# Patient Record
Sex: Female | Born: 1945 | Race: Black or African American | Hispanic: No | Marital: Married | Smoking: Former smoker
Health system: Southern US, Community
[De-identification: ages and names within clinical notes are randomized; demographics above are authoritative.]

## PROBLEM LIST (undated history)

## (undated) DIAGNOSIS — Z800 Family history of malignant neoplasm of digestive organs: Secondary | ICD-10-CM

## (undated) DIAGNOSIS — Z803 Family history of malignant neoplasm of breast: Secondary | ICD-10-CM

## (undated) DIAGNOSIS — Z9981 Dependence on supplemental oxygen: Secondary | ICD-10-CM

## (undated) DIAGNOSIS — C801 Malignant (primary) neoplasm, unspecified: Secondary | ICD-10-CM

## (undated) DIAGNOSIS — Z853 Personal history of malignant neoplasm of breast: Secondary | ICD-10-CM

## (undated) DIAGNOSIS — Z8042 Family history of malignant neoplasm of prostate: Secondary | ICD-10-CM

## (undated) DIAGNOSIS — R519 Headache, unspecified: Secondary | ICD-10-CM

## (undated) DIAGNOSIS — K219 Gastro-esophageal reflux disease without esophagitis: Secondary | ICD-10-CM

## (undated) DIAGNOSIS — R0600 Dyspnea, unspecified: Secondary | ICD-10-CM

## (undated) DIAGNOSIS — J449 Chronic obstructive pulmonary disease, unspecified: Secondary | ICD-10-CM

## (undated) DIAGNOSIS — R011 Cardiac murmur, unspecified: Secondary | ICD-10-CM

## (undated) DIAGNOSIS — R51 Headache: Secondary | ICD-10-CM

## (undated) DIAGNOSIS — I10 Essential (primary) hypertension: Secondary | ICD-10-CM

## (undated) HISTORY — DX: Family history of malignant neoplasm of breast: Z80.3

## (undated) HISTORY — DX: Personal history of malignant neoplasm of breast: Z85.3

## (undated) HISTORY — DX: Family history of malignant neoplasm of prostate: Z80.42

## (undated) HISTORY — DX: Family history of malignant neoplasm of digestive organs: Z80.0

## (undated) HISTORY — PX: TONSILECTOMY, ADENOIDECTOMY, BILATERAL MYRINGOTOMY AND TUBES: SHX2538

## (undated) HISTORY — PX: APPENDECTOMY: SHX54

## (undated) HISTORY — DX: Essential (primary) hypertension: I10

## (undated) HISTORY — PX: OTHER SURGICAL HISTORY: SHX169

## (undated) HISTORY — DX: Chronic obstructive pulmonary disease, unspecified: J44.9

## (undated) HISTORY — PX: CATARACT EXTRACTION, BILATERAL: SHX1313

---

## 2017-07-31 ENCOUNTER — Other Ambulatory Visit: Payer: Self-pay | Admitting: Radiology

## 2017-08-03 ENCOUNTER — Encounter: Payer: Self-pay | Admitting: *Deleted

## 2017-08-03 ENCOUNTER — Other Ambulatory Visit: Payer: Self-pay | Admitting: *Deleted

## 2017-08-03 DIAGNOSIS — C50411 Malignant neoplasm of upper-outer quadrant of right female breast: Secondary | ICD-10-CM | POA: Insufficient documentation

## 2017-08-03 DIAGNOSIS — Z170 Estrogen receptor positive status [ER+]: Principal | ICD-10-CM

## 2017-08-06 NOTE — Progress Notes (Signed)
[name]  [phone] [phone]     ID: [name] DOB: 1945-07-02  MR#: [id number]  GYJ#:[id number]  Patient Care Team: Raelene Bott, MD as PCP - General (Internal Medicine) Excell Seltzer, MD as Consulting Physician (General Surgery) Bernice Mullin, Virgie Dad, MD as Consulting Physician (Oncology) Eppie Gibson, MD as Attending Physician (Radiation Oncology) OTHER MD: Pulmonary Dr. Melvern Sample in [place].  Derm-   CHIEF COMPLAINT: Estrogen receptor positive breast cancer  CURRENT TREATMENT: Awaiting definitive surgery   HISTORY OF CURRENT ILLNESS: Ashley Conrad has a prior history of right-sided breast cancer, dating back to 1989.  She was treated by Dr. Gaynelle Arabian and Dr. Arloa Koh, with lumpectomy and full axillary lymph node dissection, chemotherapy (for 6 months, so most likely CMF) and then radiation.  She did not receive antiestrogens.   More recently patient had routine screening mammography on 07/17/2017 showing a possible asymmetry in the right breast. She underwent bilateral diagnostic mammography with tomography and right breast ultrasonography at  [facility] on 07/24/2017 showing: breast density category A. There is a 1.1 cm mass and a 0.8 cm mass upper outer quadrant of the right breast found mammographically. These hypoechoic masses measure 1.5 cm and 0.8 cm sonographically and were located at the 11 o'clock upper outer quadrant posteriorly and 11 cm from the nipple. There was no evidence of lymphadenopathy in the right axilla.   Accordingly on 07/31/2017 she proceeded to biopsy of the 2 right breast masses in question. 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 The 1.1 cm mass was significant for prognostic indicators: estrogen receptor, 90% positive with strong staining intensity and progesterone receptor, 70% positive  with moderate staining intensity. Proliferation marker Ki67 at 10%. HER2 not amplified with ratios HER2/CEP17 signals 1.52 and average HER2 copies per cell 3.50. The 0.8 cm mass was significant for prognostic indicators: estrogen receptor, 100% positive with strong staining intensity and progesterone receptor, 10% positive with moderate staining intentisty. Proliferation marker Ki67 at 10%. HER2 not amplified with ratios HER2/CEP17 signals 1.22 and average HER2 copies per cell 2.75.   The patient's subsequent history is as detailed below.  INTERVAL HISTORY: Ashley Conrad was evaluated in the multidisciplinary breast cancer clinic on 08/08/2017 accompanied by her daughter [name], and the patient's granddaughter [name]. Her case was also presented at the multidisciplinary breast cancer conference on the same day. At that time a preliminary plan was proposed: Breast conserving surgery, Oncotype testing, genetics testing, antiestrogens   REVIEW OF SYSTEMS: There were no specific symptoms leading to the original mammogram, which was routinely scheduled. The patient denies unusual headaches, visual changes, nausea, vomiting, stiff neck, dizziness, or gait imbalance. There has been no cough, phlegm production, or pleurisy, no chest pain or pressure, and no change in bowel or bladder habits. The patient denies fever, rash, bleeding, unexplained fatigue or unexplained weight loss. A detailed review of systems was otherwise entirely negative.   PAST MEDICAL HISTORY: Past Medical History:  Diagnosis Date  . COPD (chronic obstructive pulmonary disease) ([place])   . History of right breast cancer   . Hypertension    She denies any heart issues such as palpitations, murmur, or disease. She denies asthma, emphysema, cataracts, headaches, HTN, diabetes, issues with bladder or bowel movements.   PAST SURGICAL HISTORY: Past Surgical History:  Procedure Laterality Date  . APPENDECTOMY    . right lumpectomy    .  TONSILECTOMY, ADENOIDECTOMY, BILATERAL MYRINGOTOMY AND TUBES      FAMILY HISTORY Family History  Problem Relation Age of Onset  . Breast cancer Mother   . Prostate cancer Father   . Prostate cancer Brother    The patient's father died at age 58 due to CHF. The patient's mother died at age 71 due to aneurysm. The patient's mother was also diagnosed with breast cancer at age 30. The patient's father was diagnosed with prostate cancer at age 27. The patient's brother was diagnosed with prostate cancer at age 28. The patient denies a history of ovarian cancer in the family.   GYNECOLOGIC HISTORY:  No LMP recorded. Menarche: 72 years old Age at first live birth: 72 years old She is GXP2. Her LMP was November 1989 in her mid 74's. She never used contraception or HRT.    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 Her husband, Elenore Rota, is retired from working in Charity fundraiser. The patient's oldest daughter, [name], lives in [place] as an Web designer at [employer]. The patient's son, [name]., also lives in [place] and works in Scientist, research (life sciences). The patient has 2 grandchildren. Her granddaughter [name] is a Equities trader at [employer] social work. The patient attends [employer].         ADVANCED DIRECTIVES:    HEALTH MAINTENANCE: Social History   Tobacco Use  . Smoking status: Former Smoker    Last attempt to quit: 08/08/2012    Years since quitting: 5.0  Substance Use Topics  . Alcohol use: Not Currently  . Drug use: Never     Colonoscopy: Dr. [place] 2011/ normal  PAP:  Bone density: normal 2018   Allergies  Allergen Reactions  . Cyclobenzaprine Other (See Comments)  . Shellfish Allergy Itching    Itching    Current Outpatient Medications  Medication Sig Dispense Refill  . albuterol (PROVENTIL HFA;VENTOLIN HFA) 108 (90 Base) MCG/ACT inhaler Inhale 2 puffs into the lungs 2 (two) times daily as  needed for wheezing or shortness of breath.    Marland Kitchen atorvastatin (LIPITOR) 10 MG tablet Take 10 mg by mouth daily.    . budesonide-formoterol (SYMBICORT) 160-4.5 MCG/ACT inhaler Inhale 2 puffs into the lungs 2 (two) times daily.    . cetirizine (ZYRTEC) 10 MG tablet Take 10 mg by mouth daily.    Marland Kitchen denosumab (PROLIA) 60 MG/ML SOSY injection Inject 60 mg into the skin every 6 (six) months.    . diltiazem (TIAZAC) 240 MG 24 hr capsule Take 240 mg by mouth daily.    . DULoxetine (CYMBALTA) 30 MG capsule Take 30 mg by mouth daily.    . Ergocalciferol (VITAMIN D2 PO) Take 50,000 Units by mouth daily.    . fluticasone (FLONASE) 50 MCG/ACT nasal spray Place 1 spray into both nostrils daily.    . montelukast (SINGULAIR) 10 MG tablet Take 10 mg by mouth at bedtime.    Marland Kitchen omeprazole (PRILOSEC) 20 MG capsule Take 20 mg by mouth at bedtime.    Marland Kitchen tiotropium (SPIRIVA) 18 MCG inhalation capsule Place 18 mcg into inhaler and inhale daily.    . fluconazole (DIFLUCAN) 100 MG tablet Take 2 tabs today, then 1 tab daily for 4 more days. Hold Atorvastatin while on this. (Patient not taking: Reported on 08/08/2017) 6 tablet 0   No current facility-administered medications for this visit.     OBJECTIVE: Older African-American woman wearing oxygen by nasal cannula  Vitals:  08/08/17 0849  BP: 133/79  Pulse: 95  Resp: 18  Temp: 98.6 F (37 C)  SpO2: 91%     Body mass index is 4,405.31 kg/m.   Wt Readings from Last 3 Encounters:  08/08/17 172 lb 11.2 oz (78.3 kg)      ECOG FS:2 - Symptomatic, <50% confined to bed  Ocular: Sclerae unicteric, pupils round and equal Ear-nose-throat: Full upper plate Lymphatic: No cervical or supraclavicular adenopathy Lungs no rales or rhonchi Heart regular rate and rhythm Abd soft, nontender, positive bowel sounds MSK no focal spinal tenderness, no joint edema Neuro: non-focal, well-oriented, appropriate affect Breasts: The right breast is status post remote lumpectomy and  radiation.  I do not palpate a clearly defined mass.  There is minimal distortion of the breast contour.  The left breast is benign.  The right axilla is status post dissection remotely.  The left axilla is benign   LAB RESULTS:  CMP     Component Value Date/Time   NA 140 08/08/2017 0838   K 4.1 08/08/2017 0838   CL 102 08/08/2017 0838   CO2 28 08/08/2017 0838   GLUCOSE 130 08/08/2017 0838   BUN 16 08/08/2017 0838   CREATININE 0.78 08/08/2017 0838   CALCIUM 9.6 08/08/2017 0838   PROT 7.6 08/08/2017 0838   ALBUMIN 3.7 08/08/2017 0838   AST 12 08/08/2017 0838   ALT 11 08/08/2017 0838   ALKPHOS 121 08/08/2017 0838   BILITOT 0.4 08/08/2017 0838   GFRNONAA >60 08/08/2017 0838   GFRAA >60 08/08/2017 0838    No results found for: TOTALPROTELP, ALBUMINELP, A1GS, A2GS, BETS, BETA2SER, GAMS, MSPIKE, SPEI  No results found for: KPAFRELGTCHN, LAMBDASER, KAPLAMBRATIO  Lab Results  Component Value Date   WBC 12.1 (H) 08/08/2017   NEUTROABS 9.8 (H) 08/08/2017   HGB 13.1 08/08/2017   HCT 40.9 08/08/2017   MCV 100.7 08/08/2017   PLT 268 08/08/2017    @LASTCHEMISTRY @  No results found for: LABCA2  No components found for: KMQKMM381  No results for input(s): INR in the last 168 hours.  No results found for: LABCA2  No results found for: RRN165  No results found for: BXU383  No results found for: FXO329  No results found for: CA2729  No components found for: HGQUANT  No results found for: CEA1 / No results found for: CEA1   No results found for: AFPTUMOR  No results found for: CHROMOGRNA  No results found for: PSA1  Appointment on 08/08/2017  Component Date Value Ref Range Status  . Sodium 08/08/2017 140  136 - 145 mmol/L Final  . Potassium 08/08/2017 4.1  3.5 - 5.1 mmol/L Final  . Chloride 08/08/2017 102  98 - 109 mmol/L Final  . CO2 08/08/2017 28  22 - 29 mmol/L Final  . Glucose, Bld 08/08/2017 130  70 - 140 mg/dL Final  . BUN 08/08/2017 16  7 - 26 mg/dL Final   . Creatinine 08/08/2017 0.78  0.60 - 1.10 mg/dL Final  . Calcium 08/08/2017 9.6  8.4 - 10.4 mg/dL Final  . Total Protein 08/08/2017 7.6  6.4 - 8.3 g/dL Final  . Albumin 08/08/2017 3.7  3.5 - 5.0 g/dL Final  . AST 08/08/2017 12  5 - 34 U/L Final  . ALT 08/08/2017 11  0 - 55 U/L Final  . Alkaline Phosphatase 08/08/2017 121  40 - 150 U/L Final  . Total Bilirubin 08/08/2017 0.4  0.2 - 1.2 mg/dL Final  . GFR, Est Non Af Am  08/08/2017 >60  >60 mL/min Final  . GFR, Est AFR Am 08/08/2017 >60  >60 mL/min Final   Comment: (NOTE) The eGFR has been calculated using the CKD EPI equation. This calculation has not been validated in all clinical situations. eGFR's persistently <60 mL/min signify possible Chronic Kidney Disease.   Georgiann Hahn gap 08/08/2017 10  3 - 11 Final   Performed at [facility], [place] [address] [place], [place] [address]  . WBC Count 08/08/2017 12.1* 3.9 - 10.3 K/uL Final  . RBC 08/08/2017 4.06  3.70 - 5.45 MIL/uL Final  . Hemoglobin 08/08/2017 13.1  11.6 - 15.9 g/dL Final  . HCT 08/08/2017 40.9  34.8 - 46.6 % Final  . MCV 08/08/2017 100.7  79.5 - 101.0 fL Final  . MCH 08/08/2017 32.2  25.1 - 34.0 pg Final  . MCHC 08/08/2017 32.0  31.5 - 36.0 g/dL Final  . RDW 08/08/2017 13.0  11.2 - 14.5 % Final  . Platelet Count 08/08/2017 268  145 - 400 K/uL Final  . Neutrophils Relative % 08/08/2017 81  % Final  . Neutro Abs 08/08/2017 9.8* 1.5 - 6.5 K/uL Final  . Lymphocytes Relative 08/08/2017 11  % Final  . Lymphs Abs 08/08/2017 1.3  0.9 - 3.3 K/uL Final  . Monocytes Relative 08/08/2017 6  % Final  . Monocytes Absolute 08/08/2017 0.8  0.1 - 0.9 K/uL Final  . Eosinophils Relative 08/08/2017 1  % Final  . Eosinophils Absolute 08/08/2017 0.2  0.0 - 0.5 K/uL Final  . Basophils Relative 08/08/2017 1  % Final  . Basophils Absolute 08/08/2017 0.1  0.0 - 0.1 K/uL Final   Performed at [facility], [place] [address], [place], [place] [address]      (this displays the last labs from the last 3 days)  No results found for: TOTALPROTELP, ALBUMINELP, A1GS, A2GS, BETS, BETA2SER, GAMS, MSPIKE, SPEI (this displays SPEP labs)  No results found for: KPAFRELGTCHN, LAMBDASER, KAPLAMBRATIO (kappa/lambda light chains)  No results found for: HGBA, HGBA2QUANT, HGBFQUANT, HGBSQUAN (Hemoglobinopathy evaluation)   No results found for: LDH  No results found for: IRON, TIBC, IRONPCTSAT (Iron and TIBC)  No results found for: FERRITIN  Urinalysis No results found for: COLORURINE, APPEARANCEUR, LABSPEC, PHURINE, GLUCOSEU, HGBUR, BILIRUBINUR, KETONESUR, PROTEINUR, UROBILINOGEN, NITRITE, LEUKOCYTESUR   STUDIES: No results found.  ELIGIBLE FOR AVAILABLE RESEARCH PROTOCOL: no  ASSESSMENT: 72 y.o. [place], [place] woman  (1) status post right lumpectomy and axillary lymph node dissection 1989 4 breast cancer, no pathologic information available  (a) status post 6 months of adjuvant chemotherapy  (b) status post adjuvant radiation  (2) status post right breast upper outer quadrant biopsy 07/31/2017 for a clinical T1c N0, stage IA invasive ductal carcinoma, grade 2 or 3, estrogen and progesterone receptor positive, HER-2 not amplified, with an MIB-1 of 10%  (3) definitive surgery pending  (4) Oncotype to be obtained from the definitive surgical sample: Chemotherapy not anticipated  (5) antiestrogens to follow  (6) genetics testing pending  PLAN: We spent the better part of today's hour-long appointment discussing the biology of her diagnosis and the specifics of her situation. We first reviewed the fact that cancer is not one disease but more than 100 different diseases and that it is important to keep them separate-- otherwise when friends and relatives discuss their own cancer experiences with Glorianne confusion can result. Similarly we explained that if breast cancer spreads to the bone or liver, the patient would not  have bone cancer or liver  cancer, but breast cancer in the bone and breast cancer in the liver: one cancer in three places-- not 3 different cancers which otherwise would have to be treated in 3 different ways.  We discussed the difference between local and systemic therapy. In terms of loco-regional treatment, lumpectomy plus radiation is equivalent to mastectomy as far as survival is concerned.  In patients who have had prior radiation to the breast, and therefore no further radiation is possible, mastectomy is standard.  In this case however, as discussed with Dr. Excell Seltzer, the cancer is extremely lateral, and there will be no benefit to a full mastectomy and assuming good margins can be obtained with lumpectomy.  Accordingly the plan is for lumpectomy and no sentinel lymph node sampling in this patient who is status post full axillary lymph node dissection remotely.  We then discussed the rationale for systemic therapy. There is some risk that this cancer may have already spread to other parts of her body. Patients frequently ask at this point about bone scans, CAT scans and PET scans to find out if they have occult breast cancer somewhere else. The problem is that in early stage disease we are much more likely to find false positives then true cancers and this would expose the patient to unnecessary procedures as well as unnecessary radiation. Scans cannot answer the question the patient really would like to know, which is whether she has microscopic disease elsewhere in her body. For those reasons we do not recommend them.  Of course we would proceed to aggressive evaluation of any symptoms that might suggest metastatic disease, but that is not the case here.  Next we went over the options for systemic therapy which are anti-estrogens, anti-HER-2 immunotherapy, and chemotherapy. Enola does not meet criteria for anti-HER-2 immunotherapy. She is a good candidate for anti-estrogens.  The question of chemotherapy is more  complicated. Chemotherapy is most effective in rapidly growing, aggressive tumors. Izell  's cancer is moderately aggressive, but slow-growing.  The benefit of chemotherapy is questionable.. For that reason we are going to request an Oncotype from the definitive surgical sample, as suggested by NCCN guidelines. That will help [place] make a definitive decision regarding chemotherapy in this case.  Accordingly the plan will be for definitive surgery, Oncotype testing, and likely no chemotherapy, then antiestrogens.  The patient does qualify for genetics testing. In patients who carry a deleterious mutation [for example in a  BRCA gene], the risk of a new breast cancer developing in the future may be sufficiently great that the patient may choose bilateral mastectomies. However if she wishes to keep her breasts in that situation it is safe to do so. That would require intensified screening, which generally means not only yearly mammography but a yearly breast MRI as well.  Intensified screening would be Surya's choice and therefore there is no need to postpone her surgery until we have her genetics results  Camree has a good understanding of the overall plan. She agrees with it. She knows the goal of treatment in her case is cure. She will call with any problems that may develop before her next visit here.   Keoki Mchargue, Virgie Dad, MD  08/08/17 11:30 AM Medical Oncology and Hematology [facility] [address] [place], [place] [address] Tel. [phone]    Fax. [phone]  Alice Rieger, am acting as scribe for Chauncey Cruel MD.  I, Lurline Del MD, have reviewed the above documentation  for accuracy and completeness, and I agree with the above.

## 2017-08-08 ENCOUNTER — Ambulatory Visit
Admission: RE | Admit: 2017-08-08 | Discharge: 2017-08-08 | Disposition: A | Discharge status: Home / Self Care | Payer: Commercial Indemnity | Source: Ambulatory Visit | Attending: Radiation Oncology | Admitting: Radiation Oncology

## 2017-08-08 ENCOUNTER — Encounter: Payer: Self-pay | Admitting: Radiation Oncology

## 2017-08-08 ENCOUNTER — Inpatient Hospital Stay: Payer: Medicare Other | Attending: Oncology | Admitting: Oncology

## 2017-08-08 ENCOUNTER — Ambulatory Visit: Payer: Medicare Other | Attending: General Surgery | Admitting: Physical Therapy

## 2017-08-08 ENCOUNTER — Other Ambulatory Visit: Payer: Self-pay

## 2017-08-08 ENCOUNTER — Ambulatory Visit: Payer: Self-pay | Admitting: General Surgery

## 2017-08-08 ENCOUNTER — Encounter: Payer: Self-pay | Admitting: Oncology

## 2017-08-08 ENCOUNTER — Encounter: Payer: Self-pay | Admitting: Physical Therapy

## 2017-08-08 ENCOUNTER — Encounter: Payer: Self-pay | Admitting: *Deleted

## 2017-08-08 ENCOUNTER — Inpatient Hospital Stay: Payer: Medicare Other

## 2017-08-08 VITALS — BP 133/79 | HR 95 | Temp 98.6°F | Resp 18 | Ht <= 58 in | Wt 172.7 lb

## 2017-08-08 DIAGNOSIS — Z9981 Dependence on supplemental oxygen: Secondary | ICD-10-CM

## 2017-08-08 DIAGNOSIS — Z87891 Personal history of nicotine dependence: Secondary | ICD-10-CM

## 2017-08-08 DIAGNOSIS — C50411 Malignant neoplasm of upper-outer quadrant of right female breast: Secondary | ICD-10-CM | POA: Diagnosis present

## 2017-08-08 DIAGNOSIS — Z170 Estrogen receptor positive status [ER+]: Principal | ICD-10-CM

## 2017-08-08 DIAGNOSIS — Z9189 Other specified personal risk factors, not elsewhere classified: Secondary | ICD-10-CM | POA: Diagnosis present

## 2017-08-08 DIAGNOSIS — Z8042 Family history of malignant neoplasm of prostate: Secondary | ICD-10-CM | POA: Diagnosis not present

## 2017-08-08 DIAGNOSIS — Z803 Family history of malignant neoplasm of breast: Secondary | ICD-10-CM | POA: Diagnosis not present

## 2017-08-08 DIAGNOSIS — Z853 Personal history of malignant neoplasm of breast: Secondary | ICD-10-CM | POA: Diagnosis not present

## 2017-08-08 DIAGNOSIS — R293 Abnormal posture: Secondary | ICD-10-CM | POA: Diagnosis present

## 2017-08-08 LAB — CMP (CANCER CENTER ONLY)
ALT: 11 U/L (ref 0–55)
AST: 12 U/L (ref 5–34)
Albumin: 3.7 g/dL (ref 3.5–5.0)
Alkaline Phosphatase: 121 U/L (ref 40–150)
Anion gap: 10 (ref 3–11)
BUN: 16 mg/dL (ref 7–26)
CHLORIDE: 102 mmol/L (ref 98–109)
CO2: 28 mmol/L (ref 22–29)
Calcium: 9.6 mg/dL (ref 8.4–10.4)
Creatinine: 0.78 mg/dL (ref 0.60–1.10)
GFR, Estimated: >60 mL/min (ref >60)
Glucose, Bld: 130 mg/dL (ref 70–140)
POTASSIUM: 4.1 mmol/L (ref 3.5–5.1)
SODIUM: 140 mmol/L (ref 136–145)
Total Bilirubin: 0.4 mg/dL (ref 0.2–1.2)
Total Protein: 7.6 g/dL (ref 6.4–8.3)

## 2017-08-08 LAB — CBC WITH DIFFERENTIAL (CANCER CENTER ONLY)
Basophils Absolute: 0.1 10*3/uL (ref 0.0–0.1)
Basophils Relative: 1 %
Eosinophils Absolute: 0.2 10*3/uL (ref 0.0–0.5)
Eosinophils Relative: 1 %
HEMATOCRIT: 40.9 % (ref 34.8–46.6)
HEMOGLOBIN: 13.1 g/dL (ref 11.6–15.9)
LYMPHS ABS: 1.3 10*3/uL (ref 0.9–3.3)
LYMPHS PCT: 11 %
MCH: 32.2 pg (ref 25.1–34.0)
MCHC: 32 g/dL (ref 31.5–36.0)
MCV: 100.7 fL (ref 79.5–101.0)
MONOS PCT: 6 %
Monocytes Absolute: 0.8 10*3/uL (ref 0.1–0.9)
NEUTROS ABS: 9.8 10*3/uL — AB (ref 1.5–6.5)
NEUTROS PCT: 81 %
Platelet Count: 268 10*3/uL (ref 145–400)
RBC: 4.06 MIL/uL (ref 3.70–5.45)
RDW: 13 % (ref 11.2–14.5)
WBC: 12.1 10*3/uL — AB (ref 3.9–10.3)

## 2017-08-08 MED ORDER — FLUCONAZOLE 100 MG PO TABS: ORAL_TABLET | ORAL | 0 refills | Status: DC

## 2017-08-08 NOTE — Progress Notes (Signed)
Radiation Oncology         [phone] ________________________________  Initial Outpatient Consultation  Name: Ashley Conrad MRN: [id number]  Date: 08/08/2017  DOB: Sep 08, 1945  SF:KCLEXNT, [place], MD  Excell Seltzer, MD   REFERRING PHYSICIAN: Excell Seltzer, MD  DIAGNOSIS:    ICD-10-CM   1. 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 female who presented with screening detected focal asymmetry in the right breast on 07/17/17 mammography. Ultrasound from 07/24/17 showed a 1.5 cm mass in the right breast and an adjacent 0.8 cm lobulated mass, both at 11:00 posterior depth and 11 cm from the nipple. No abnormalities were seen in the right axilla. Biopsy of both masses showed grade 2-3 invasive ductal carcinoma with characteristics as described above in the diagnosis and DCIS.  Of note, the patient has a personal history of breast cancer in the UOQ of the right breast, status post right lumpectomy and chemotherapy + radiation therapy in 19 (age 32). She also has a family history of breast cancer in her mother, who was diagnosed with breast cancer at age 67.  On review of systems, the patient is positive for fever, chills, night sweats, weight change, loss of sleep, and fatigue that affects her activities. She wears glasses and dentures. She is positive for dry mouth and sinus problems. She is positive for swelling in her left foot. She is positive for shortness of breath with walking and climbing stairs. She sleeps on 2 pillows. She is positive for ulcer and hernia. She is positive for abnormal moles. She is positive for  headaches and forgetfulness.   GYNECOLOGIC HISTORY: -Age at first menstrual period: 55 -Are you still having periods? No -Approximate date of last period: November 1989 -Have you used hormone replacement?: No  OBSTETRIC HISTORY: -How many children have you carried to term?: 2 -Your age at first live birth: 85 -Have you used birth control pills or hormone shots for contraception?: No  PREVIOUS RADIATION THERAPY: Yes - For right breast cancer in 1989, treated here at [facility]:  has a past medical history of COPD (chronic obstructive pulmonary disease) ([place]), History of right breast cancer, and Hypertension.    PAST SURGICAL HISTORY: Past Surgical History:  Procedure Laterality Date  . APPENDECTOMY    . right lumpectomy    . TONSILECTOMY, ADENOIDECTOMY, BILATERAL MYRINGOTOMY AND TUBES      FAMILY HISTORY: family history includes Breast cancer in her mother; Prostate cancer in her brother and father.  SOCIAL HISTORY:  reports that she quit smoking about 5 years ago. She does not have any smokeless tobacco history on file. She reports that she drank alcohol. She reports that she does not use drugs. Married with 1 daughter and 1 son. Retired.   ALLERGIES: Cyclobenzaprine and Shellfish allergy  MEDICATIONS:  Current Outpatient Medications  Medication Sig Dispense Refill  . albuterol (PROVENTIL HFA;VENTOLIN HFA) 108 (90 Base) MCG/ACT inhaler Inhale 2 puffs into the lungs 2 (two) times daily as needed for wheezing or shortness of breath.    Marland Kitchen atorvastatin (LIPITOR) 10 MG tablet Take 10 mg by mouth daily.    . budesonide-formoterol (SYMBICORT) 160-4.5  MCG/ACT inhaler Inhale 2 puffs into the lungs 2 (two) times daily.    . cetirizine (ZYRTEC) 10 MG tablet Take 10 mg by mouth daily.    Marland Kitchen denosumab (PROLIA) 60 MG/ML SOSY injection Inject 60 mg into the skin every 6 (six) months.    . diltiazem (TIAZAC) 240 MG 24 hr capsule Take 240 mg by mouth daily.    . DULoxetine  (CYMBALTA) 30 MG capsule Take 30 mg by mouth daily.    . Ergocalciferol (VITAMIN D2 PO) Take 50,000 Units by mouth daily.    . fluconazole (DIFLUCAN) 100 MG tablet Take 2 tabs today, then 1 tab daily for 4 more days. Hold Atorvastatin while on this. (Patient not taking: Reported on 08/08/2017) 6 tablet 0  . fluticasone (FLONASE) 50 MCG/ACT nasal spray Place 1 spray into both nostrils daily.    . montelukast (SINGULAIR) 10 MG tablet Take 10 mg by mouth at bedtime.    Marland Kitchen omeprazole (PRILOSEC) 20 MG capsule Take 20 mg by mouth at bedtime.    Marland Kitchen tiotropium (SPIRIVA) 18 MCG inhalation capsule Place 18 mcg into inhaler and inhale daily.     No current facility-administered medications for this encounter.     REVIEW OF SYSTEMS: A 10+ POINT REVIEW OF SYSTEMS WAS OBTAINED including neurology, dermatology, psychiatry, cardiac, respiratory, lymph, extremities, GI, GU, Musculoskeletal, constitutional, breasts, reproductive, HEENT.  All pertinent positives are noted in the HPI.  All others are negative.   PHYSICAL EXAM:  Vitals with BMI 08/08/2017  Height   Weight 172 lbs 11 oz  BMI   Systolic 938  Diastolic 79  Pulse 95  Respirations 18   General: Alert and oriented, in no acute distress. HEENT: Head is normocephalic. Extraocular movements are intact. Breathing oxygen per nasal canula. Upper dentures were removed for exam. White coating in mouth suspicious for thrush.  Neck: Neck is supple, no palpable cervical or supraclavicular lymphadenopathy. Heart: Regular in rate and rhythm with no murmurs, rubs, or gallops. Chest: Clear to auscultation bilaterally, with no rhonchi, wheezes, or rales. Abdomen: Soft, nontender, nondistended, with no rigidity or guarding. Extremities: No cyanosis or edema in UEs. Lymphatics: see Neck Exam Skin: No concerning lesions. Musculoskeletal: Symmetric strength and muscle tone throughout. Neurologic: Cranial nerves II through XII are grossly intact. No obvious  focalities. Speech is fluent. Coordination is intact. Psychiatric: Judgment and insight are intact. Affect is appropriate. Breasts: Right breast is much smaller than left. There is thickening under right axillary/lumpectomy scar which is consistent with scar tissue but probably corresponds with the new cancer. Suspect the span of cancer between the two masses is approximately 3 cm in dimension. No palpable adenopathy appreciated. Left side is clear.   ECOG = 2  0 - Asymptomatic (Fully active, able to carry on all predisease activities without restriction)  1 - Symptomatic but completely ambulatory (Restricted in physically strenuous activity but ambulatory and able to carry out work of a light or sedentary nature. For example, light housework, office work)  2 - Symptomatic, <50% in bed during the day (Ambulatory and capable of all self care but unable to carry out any work activities. Up and about more than 50% of waking hours)  3 - Symptomatic, >50% in bed, but not bedbound (Capable of only limited self-care, confined to bed or chair 50% or more of waking hours)  4 - Bedbound (Completely disabled. Cannot carry on any self-care. Totally confined to bed or chair)  5 - Death   Lolita Rieger, Daniel Nones  RH, Tormey DC, et al. [id number]). "Toxicity and response criteria of the [facility]". [place] Oncol. 5 (6): 649-55   LABORATORY DATA:  Lab Results  Component Value Date   WBC 12.1 (H) 08/08/2017   HGB 13.1 08/08/2017   HCT 40.9 08/08/2017   MCV 100.7 08/08/2017   PLT 268 08/08/2017   CMP     Component Value Date/Time   NA 140 08/08/2017 0838   K 4.1 08/08/2017 0838   CL 102 08/08/2017 0838   CO2 28 08/08/2017 0838   GLUCOSE 130 08/08/2017 0838   BUN 16 08/08/2017 0838   CREATININE 0.78 08/08/2017 0838   CALCIUM 9.6 08/08/2017 0838   PROT 7.6 08/08/2017 0838   ALBUMIN 3.7 08/08/2017 0838   AST 12 08/08/2017 0838   ALT 11 08/08/2017 0838   ALKPHOS 121 08/08/2017  0838   BILITOT 0.4 08/08/2017 0838   GFRNONAA >60 08/08/2017 0838   GFRAA >60 08/08/2017 0838         RADIOGRAPHY: In HPI. I reviewed her images    IMPRESSION/PLAN: Right Breast Cancer   The patient has a new breast cancer in the same portion of the breast where she previously underwent lumpectomy 30 years ago. Due to prior radiation, I would not recommend reirradiating her breast. We discussed the most aggressive option which would be mastectomy. This would be followed by Oncotype testing and then chemotherapy only if indicated. Regardless, she will eventually undergo anti-estrogen therapy. Alternatively, the patient could undergo a lumpectomy removing the two masses followed by chemotherapy, only if indicated, and anti-estrogens. This would require follow-up in case of local recurrence, but with negative margins, she should do reasonably well with the coverage of anti-estrogens. She will talk about these options further with Dr. Excell Seltzer. I will see her back as needed, but at this time do not anticipate any further radiotherapy.  The patient reports dry mouth. I recommended Biotene products. Also prescribing fluconazole for suspected thrush.  Recommend genetic counseling due to personal and  family history. I asked nurse navigators to arrange this.    Eppie Gibson, MD  This document serves as a record of services personally performed by Eppie Gibson, MD. It was created on her behalf by Rae Lips, a trained medical scribe. The creation of this record is based on the scribe's personal observations and the provider's statements to them. This document has been checked and approved by the attending provider.

## 2017-08-08 NOTE — Patient Instructions (Signed)

## 2017-08-08 NOTE — Therapy (Signed)
[place], [place], [address] Phone: [phone]   Fax:  [phone]  Physical Therapy Evaluation  Patient Details  Name: Ashley Conrad MRN: [id number] Date of Birth: 05/25/1945 Referring Provider: Dr. Excell Seltzer   Encounter Date: 08/08/2017  PT End of Session - 08/08/17 1148    Visit Number  1    Number of Visits  2    Date for PT Re-Evaluation  10/03/17    PT Start Time  0950    PT Stop Time  1000 Also saw pt from 1050-1107 for a total of 27 minutes    PT Time Calculation (min)  10 min    Activity Tolerance  Patient tolerated treatment well    Behavior During Therapy  [facility] for tasks assessed/performed       Past Medical History:  Diagnosis Date  . COPD (chronic obstructive pulmonary disease) ([place])   . History of right breast cancer   . Hypertension     Past Surgical History:  Procedure Laterality Date  . APPENDECTOMY    . right lumpectomy    . TONSILECTOMY, ADENOIDECTOMY, BILATERAL MYRINGOTOMY AND TUBES      There were no vitals filed for this visit.   Subjective Assessment - 08/08/17 1040    Subjective  Patient reports she is here today to be seen by her medical team for her newly diagnosed right breast cancer.    Patient is accompained by:  Family member    Pertinent History  Patient was diagnosed on 07/17/17 with right grade II-III invasive ductal carcinoma breast cancer with DCIS. There are 2 adjacent areas measuring 8 mm and 1.5 cm in the upper outer quadrant. It is ER/PR positive and HER2 negative with a Ki67 of 10%. She has a previous history of a right breast cancer in 1989 where she had a right lumpectomy and axillary lymph node dissection with radiation. She also has COPD and is on portable oxygen. She has not previously had any issues with lymphedema but there is concern that a surgery to the upper outer quadrant could cause a problem with lymphedema as she is already at  high risk.    Patient Stated Goals  Prevent lymphedema and learn post op shoulder ROM HEP    Currently in Pain?  No/denies         [facility] PT Assessment - 08/08/17 0001      Assessment   Medical Diagnosis  Rt breast cancer    Referring Provider  Dr. Excell Seltzer    Onset Date/Surgical Date  07/17/17    Hand Dominance  Right    Prior Therapy  Currently in pulmonary rehab      Precautions   Precautions  Other (comment)    Precaution Comments  Active cancer; on O2; rt arm lymphedema risk      Restrictions   Weight Bearing Restrictions  No      Balance Screen   Has the patient fallen in the past 6 months  Yes    How many times?  1 Tripped over O2 cord; reports no balance issues    Has the patient had a decrease in activity level because of a fear of falling?   No    Is the patient reluctant to leave their home because of a fear of falling?   No      Home Film/video editor residence    Living Arrangements  Spouse/significant other  Available Help at Discharge  Family      Prior Function   Level of Independence  Independent    Vocation  Retired    Leisure  She does a chair exercise video for 30 min 3x/week      Cognition   Overall Cognitive Status  Within Functional Limits for tasks assessed      Posture/Postural Control   Posture/Postural Control  Postural limitations    Postural Limitations  Rounded Shoulders;Forward head;Increased thoracic kyphosis      ROM / Strength   AROM / PROM / Strength  AROM;Strength      AROM   AROM Assessment Site  Shoulder    Right/Left Shoulder  Right;Left    Right Shoulder Extension  41 Degrees    Right Shoulder Flexion  132 Degrees    Right Shoulder ABduction  160 Degrees    Right Shoulder Internal Rotation  40 Degrees    Right Shoulder External Rotation  76 Degrees    Left Shoulder Extension  34 Degrees    Left Shoulder Flexion  134 Degrees    Left Shoulder ABduction  142 Degrees    Left Shoulder  Internal Rotation  52 Degrees    Left Shoulder External Rotation  73 Degrees      Strength   Overall Strength  Within functional limits for tasks performed        LYMPHEDEMA/ONCOLOGY QUESTIONNAIRE - 08/08/17 1145      Type   Cancer Type  Right breast      Lymphedema Assessments   Lymphedema Assessments  Upper extremities      Right Upper Extremity Lymphedema   10 cm Proximal to Olecranon Process  29.2 cm    Olecranon Process  23.5 cm    10 cm Proximal to Ulnar Styloid Process  21.4 cm    Just Proximal to Ulnar Styloid Process  15.6 cm    Across Hand at [employer]  18.9 cm    At [employer] of 2nd Digit  6.4 cm      Left Upper Extremity Lymphedema   10 cm Proximal to Olecranon Process  31.4 cm    Olecranon Process  24.1 cm    10 cm Proximal to Ulnar Styloid Process  21.1 cm    Just Proximal to Ulnar Styloid Process  14.5 cm    Across Hand at [employer]  18.3 cm    At [place] of 2nd Digit  6.2 cm             Objective measurements completed on examination: See above findings.    Patient was instructed today in a home exercise program today for post op shoulder range of motion. These included active assist shoulder flexion in sitting, scapular retraction, wall walking with shoulder abduction, and hands behind head external rotation.  She was encouraged to do these twice a day, holding 3 seconds and repeating 5 times when permitted by her physician.     PT Education - 08/08/17 1147    Education Details  Lymphedema risk reduction and post op shoulder ROM HEP    Person(s) Educated  Patient;Child(ren)    Methods  Explanation;Demonstration;Handout    Comprehension  Returned demonstration;Verbalized understanding          PT Long Term Goals - 08/08/17 1153      PT LONG TERM GOAL #1   Title  Patient will demonstrate she has returned to baseline related to shoulder ROM and function.    Time  [facility] Goals - 08/08/17  1153      Patient will be able to verbalize understanding of pertinent lymphedema risk reduction practices relevant to her diagnosis specifically related to skin care.   Time  1    Period  Days    Status  Achieved      Patient will be able to return demonstrate and/or verbalize understanding of the post-op home exercise program related to regaining shoulder range of motion.   Time  1    Period  Days    Status  Achieved      Patient will be able to verbalize understanding of the importance of attending the postoperative After Breast Cancer Class for further lymphedema risk reduction education and therapeutic exercise.   Time  1    Period  Days    Status  Achieved            Plan - 08/08/17 1149    Clinical Impression Statement  Patient was diagnosed on 07/17/17 with right grade II-III invasive ductal carcinoma breast cancer with DCIS. There are 2 adjacent areas measuring 8 mm and 1.5 cm in the upper outer quadrant. It is ER/PR positive and HER2 negative with a Ki67 of 10%. She has a previous history of a right breast cancer in 1989 where she had a right lumpectomy and axillary lymph node dissection with radiation. She also has COPD and is on portable oxygen. She has not previously had any issues with lymphedema but there is concern that a surgery to the upper outer quadrant could cause a problem with lymphedema as she is already at high risk. Her multidisciplinary medical team met prior to her assessments to determine a recommended treatment plan. She is planning to have a right lumpectomy in the right upper outer quadrant of her breast followed by Oncotype testing and anti-estrogen therapy. She will benefit from a post op PT reassessment to closely monitor her right arm as she is at risk for lymphedema now.    History and Personal Factors relevant to plan of care:  COPD on O2; previous right breast cancer with ALND    Clinical Presentation  Evolving    Clinical Presentation due to:   Unknown extent of diease    Clinical Decision Making  Moderate    Rehab Potential  Good    Clinical Impairments Affecting Rehab Potential  Comorbidities    PT Frequency  -- Eval and 1 f/u visit    PT Treatment/Interventions  ADLs/Self Care Home Management;Therapeutic exercise;Patient/family education    PT Next Visit Plan  Will reassess 3-4 weeks post op to determine needs    PT Home Exercise Plan  Post op shoulder ROM HEP    Consulted and Agree with Plan of Care  Patient;Family member/caregiver    Family Member Consulted  Daughter and grand-daughter       Patient will benefit from skilled therapeutic intervention in order to improve the following deficits and impairments:  Decreased knowledge of precautions, Impaired UE functional use, Decreased range of motion, Postural dysfunction, Pain  Visit Diagnosis: Malignant neoplasm of upper-outer quadrant of right breast in female, estrogen receptor positive ([place]) - Plan: PT plan of care cert/re-cert  Abnormal posture - Plan: PT plan of care cert/re-cert  At risk for lymphedema - Plan: PT plan of care cert/re-cert   Patient will follow up at outpatient cancer rehab 3-4 weeks following  surgery.  If the patient requires physical therapy at that time, a specific plan will be dictated and sent to the referring physician for approval. The patient was educated today on appropriate basic range of motion exercises to begin post operatively and the importance of attending the After Breast Cancer class following surgery.  Patient was educated today on lymphedema risk reduction practices as it pertains to recommendations that will benefit the patient immediately following surgery.  She verbalized good understanding.     Problem List Patient Active Problem List   Diagnosis Date Noted  . Malignant neoplasm of upper-outer quadrant of right breast in female, estrogen receptor positive ([place]) 08/03/2017    Annia Friendly, PT 08/08/17 11:55 AM  [place], [place], [address] Phone: [phone]   Fax:  [phone]  Name: Ashley Conrad MRN: [id number] Date of Birth: 1945-05-04

## 2017-08-08 NOTE — Progress Notes (Signed)
Clinical Social Work [place] Psychosocial Distress Screening [place]  Patient completed distress screening protocol and scored a 5 on the Psychosocial Distress Thermometer which indicates moderate distress. Clinical Social Worker met with patient and patients family in [facility] to assess for distress and other psychosocial needs. Patient stated she was feeling overwhelmed but felt "better" after meeting with the treatment team and getting more information on her treatment plan. CSW and patient discussed common feeling and emotions when being diagnosed with cancer, and the importance of support during treatment. CSW informed patient of the support team and support services at [facility]. CSW provided contact information and encouraged patient to call with any questions or concerns.  ON[facility] DISTRESS SCREENING 08/08/2017  Screening Type Initial Screening  Distress experienced in past week (1-10) 5  Emotional problem type Adjusting to illness  Physical Problem type Breathing  Physician notified of physical symptoms Yes     Johnnye Lana, MSW, LCSW, OSW-C Clinical Social Worker [place] [phone]

## 2017-08-08 NOTE — Progress Notes (Signed)
Nutrition Assessment  Reason for Assessment:  Pt seen in Breast Clinic  ASSESSMENT:   72 year old female with new diagnosis of breast cancer.  Past medical history of COPD oxygen dependent followed at [facility], breast cancer in the 1980s.    Patient reports good appetite and that she sees RD at [facility] in cardiac pulmonary rehab.  Medications:  reviewed  Labs: reviewed  Anthropometrics:   Height: 62.5 inches Weight: 172 lb 11.2 oz BMI: 44   NUTRITION DIAGNOSIS: Food and nutrition related knowledge deficit related to new diagnosis of breast cancer as evidenced by no prior need for nutrition related information.  INTERVENTION:   Discussed and provided packet of information regarding nutritional tips for breast cancer patients.  Questions answered.  Teachback method used.  Contact information provided and patient knows to contact me with questions/concerns.    MONITORING, EVALUATION, and GOAL: Pt will consume a healthy plant based diet to maintain lean body mass throughout treatment.   Ashley Deininger B. Ashley Conrad, [place], [place] Registered Dietitian [phone] (pager)

## 2017-08-09 ENCOUNTER — Encounter: Payer: Commercial Indemnity | Admitting: Genetics

## 2017-08-14 ENCOUNTER — Inpatient Hospital Stay (HOSPITAL_BASED_OUTPATIENT_CLINIC_OR_DEPARTMENT_OTHER): Payer: Medicare Other | Admitting: Genetics

## 2017-08-14 ENCOUNTER — Inpatient Hospital Stay: Payer: Medicare Other

## 2017-08-14 DIAGNOSIS — Z8042 Family history of malignant neoplasm of prostate: Secondary | ICD-10-CM

## 2017-08-14 DIAGNOSIS — C50411 Malignant neoplasm of upper-outer quadrant of right female breast: Secondary | ICD-10-CM

## 2017-08-14 DIAGNOSIS — Z800 Family history of malignant neoplasm of digestive organs: Secondary | ICD-10-CM

## 2017-08-14 DIAGNOSIS — Z170 Estrogen receptor positive status [ER+]: Secondary | ICD-10-CM | POA: Diagnosis not present

## 2017-08-14 DIAGNOSIS — Z803 Family history of malignant neoplasm of breast: Secondary | ICD-10-CM | POA: Diagnosis not present

## 2017-08-14 DIAGNOSIS — Z853 Personal history of malignant neoplasm of breast: Secondary | ICD-10-CM

## 2017-08-14 LAB — CBC WITH DIFFERENTIAL/PLATELET
BASOS ABS: 0.1 10*3/uL (ref 0.0–0.1)
Basophils Relative: 1 %
EOS ABS: 0.2 10*3/uL (ref 0.0–0.5)
Eosinophils Relative: 2 %
HCT: 40.7 % (ref 34.8–46.6)
HEMOGLOBIN: 12.9 g/dL (ref 11.6–15.9)
Lymphocytes Relative: 14 %
Lymphs Abs: 1.5 10*3/uL (ref 0.9–3.3)
MCH: 31.7 pg (ref 25.1–34.0)
MCHC: 31.6 g/dL (ref 31.5–36.0)
MCV: 100.4 fL (ref 79.5–101.0)
Monocytes Absolute: 0.6 10*3/uL (ref 0.1–0.9)
Monocytes Relative: 6 %
NEUTROS PCT: 77 %
Neutro Abs: 8.2 10*3/uL — ABNORMAL HIGH (ref 1.5–6.5)
Platelets: 266 10*3/uL (ref 145–400)
RBC: 4.05 MIL/uL (ref 3.70–5.45)
RDW: 13.3 % (ref 11.2–14.5)
WBC: 10.5 10*3/uL — AB (ref 3.9–10.3)

## 2017-08-14 LAB — COMPREHENSIVE METABOLIC PANEL
ALBUMIN: 3.6 g/dL (ref 3.5–5.0)
ALK PHOS: 118 U/L (ref 40–150)
ALT: 13 U/L (ref 0–55)
AST: 15 U/L (ref 5–34)
Anion gap: 7 (ref 3–11)
BUN: 15 mg/dL (ref 7–26)
CALCIUM: 9.6 mg/dL (ref 8.4–10.4)
CO2: 29 mmol/L (ref 22–29)
CREATININE: 0.85 mg/dL (ref 0.60–1.10)
Chloride: 105 mmol/L (ref 98–109)
GFR calc Af Amer: >60 mL/min (ref >60)
GFR calc non Af Amer: >60 mL/min (ref >60)
GLUCOSE: 113 mg/dL (ref 70–140)
Potassium: 4 mmol/L (ref 3.5–5.1)
SODIUM: 141 mmol/L (ref 136–145)
Total Bilirubin: 0.2 mg/dL (ref 0.2–1.2)
Total Protein: 7.2 g/dL (ref 6.4–8.3)

## 2017-08-15 ENCOUNTER — Encounter: Payer: Self-pay | Admitting: Genetics

## 2017-08-15 DIAGNOSIS — Z800 Family history of malignant neoplasm of digestive organs: Secondary | ICD-10-CM | POA: Insufficient documentation

## 2017-08-15 DIAGNOSIS — Z853 Personal history of malignant neoplasm of breast: Secondary | ICD-10-CM

## 2017-08-15 DIAGNOSIS — Z803 Family history of malignant neoplasm of breast: Secondary | ICD-10-CM | POA: Insufficient documentation

## 2017-08-15 DIAGNOSIS — Z8042 Family history of malignant neoplasm of prostate: Secondary | ICD-10-CM | POA: Insufficient documentation

## 2017-08-15 HISTORY — DX: Personal history of malignant neoplasm of breast: Z85.3

## 2017-08-15 NOTE — Progress Notes (Signed)
REFERRING PROVIDER: Chauncey Cruel, MD [address] [place],  [address]  PRIMARY PROVIDER:  Raelene Bott, MD  PRIMARY REASON FOR VISIT:  1. Malignant neoplasm of upper-outer quadrant of right breast in female, estrogen receptor positive ([place])   2. Family history of breast cancer   3. Family history of prostate cancer   4. Family history of stomach cancer   5. Personal history of breast cancer     HISTORY OF PRESENT ILLNESS:   Ashley Conrad, a 72 y.o. female, was seen for a [facility] cancer genetics consultation at the request of Ashley Conrad due to a personal and family history of cancer.  Ashley Conrad presents to clinic today to discuss the possibility of a hereditary predisposition to cancer, genetic testing, and to further clarify her future cancer risks, as well as potential cancer risks for family members.   At the age of 7, Ashley Conrad was first diagnosed with right breast cancer.  She had lumpectomy and radiation.  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 Ashley Conrad is currently considering surgical options at this time plans to have antiestrogen therapy.   HORMONAL RISK FACTORS:  Menarche was at age 11.  First live birth at age 40.  OCP use for approximately 0 years.  Ovaries intact: yes.  Hysterectomy: no.  Menopausal status: postmenopausal.  HRT use: 0 years. Colonoscopy: yes; reportedly normal.   Past Medical History:  Diagnosis Date  . COPD (chronic obstructive pulmonary disease) ([place])   . Family history of breast cancer   . Family history of prostate cancer   . Family history of stomach cancer   . History of right breast cancer   . Hypertension   . Personal history of breast cancer 08/15/2017    Past Surgical History:  Procedure Laterality Date  . APPENDECTOMY    . right lumpectomy    . TONSILECTOMY, ADENOIDECTOMY, BILATERAL MYRINGOTOMY AND  TUBES      Social History   Socioeconomic History  . Marital status: Married    Spouse name: Not on file  . Number of children: Not on file  . Years of education: Not on file  . Highest education level: Not on file  Occupational History  . Not on file  Social Needs  . Financial resource strain: Not on file  . Food insecurity:    Worry: Not on file    Inability: Not on file  . Transportation needs:    Medical: Not on file    Non-medical: Not on file  Tobacco Use  . Smoking status: Former Smoker    Last attempt to quit: 08/08/2012    Years since quitting: 5.0  Substance and Sexual Activity  . Alcohol use: Not Currently  . Drug use: Never  . Sexual activity: Not on file  Lifestyle  . Physical activity:    Days per week: Not on file    Minutes per session: Not on file  . Stress: Not on file  Relationships  . Social connections:    Talks on phone: Not on file    Gets together: Not on file    Attends religious service: Not on file    Active member of club or organization: Not on file    Attends meetings of clubs or organizations: Not on file    Relationship status: Not on file  Other Topics Concern  . Not on file  Social History Narrative  .  Not on file     FAMILY HISTORY:  We obtained a detailed, 4-generation family history.  Significant diagnoses are listed below: Family History  Problem Relation Age of Onset  . Breast cancer Mother 51  . Prostate cancer Father 47       had radiation  . Prostate cancer Brother 66       has surgery  . Stomach cancer Paternal Aunt        early 45's  . Diabetes Maternal Grandmother   . Stomach cancer Paternal Aunt        early 32's   Ashley Conrad has a 91 year-old daughter and a 63 year-old son. No history of cancer.  Ashley Conrad has a granddaughter and a grandson.  Ashley Conrad has 4 sisters and 2 brothers: -59 sisters range in ages from 41's-70's with no history of cancer.  -1 brother is in his 68's and was diagnosed  with prostate cancer at 19  He had surgery to treat this.  -1 brother has no history of cancer.   Ms. Rightmyer father: died at 73, he had a history of prostate cancer at 31.  He had radiation treatment for this.  Paternal Aunts/Uncles: 6 paternal aunts, 2 had stomach cancer in their early 56's.  Patient also has 7 paternal uncles with no history of cancer.  Paternal cousins: no history of cancer.  Paternal grandfather: died in old age with no history of cancer.  Paternal grandmother:died in old age with no history of cancer.   Ms. Dorff mother: died at 58, she had a history of breast cancer that was diagnosed when she was 62.  She had her uterus and ovaries removed during her lifetime.  Maternal Aunts/Uncles: 3 maternal aunts, 3 maternal uncles, no history of cancer.  Maternal cousins: no history of cancer.  Maternal grandfather: died in old age with no history of cancer.  Maternal grandmother:died in middle age, had diabetes  Ms. Mahr is unaware of previous family history of genetic testing for hereditary cancer risks. Patient's ancestors are of African American/[place]asian/Native American descent (ancestry DNA testing),  There is no reported Ashkenazi Jewish ancestry. There is no known consanguinity.  GENETIC COUNSELING ASSESSMENT: Ashley Conrad is a 72 y.o. female with a personal and family history which is somewhat suggestive of a Hereditary Cancer Predisposition Syndrome. We, therefore, discussed and recommended the following at today's visit.   DISCUSSION: We reviewed the characteristics, features and inheritance patterns of hereditary cancer syndromes. We also discussed genetic testing, including the appropriate family members to test, the process of testing, insurance coverage and turn-around-time for results. We discussed the implications of a negative, positive and/or variant of uncertain significant result. We recommended Ashley Conrad pursue genetic testing  for the Common Hereditary Cancers gene panel.  Ashley Conrad expressed that the results of this genetic testing would not impact her surgical decision.   The Common Hereditary Cancer Panel offered by [facility] includes sequencing and/or deletion duplication testing of the following 47 genes: APC, ATM, AXIN2, BARD1, BMPR1A, BRCA1, BRCA2, BRIP1, CDH1, CDKN2A (p14ARF), CDKN2A (p16INK4a), CKD4, CHEK2, CTNNA1, DICER1, EPCAM (Deletion/duplication testing only), GREM1 (promoter region deletion/duplication testing only), KIT, MEN1, MLH1, MSH2, MSH3, MSH6, MUTYH, NBN, NF1, NHTL1, PALB2, PDGFRA, PMS2, POLD1, POLE, PTEN, RAD50, RAD51C, RAD51D, SDHB, SDHC, SDHD, SMAD4, SMARCA4. STK11, TP53, TSC1, TSC2, and VHL.  The following genes were evaluated for sequence changes only: SDHA and HOXB13 c.251G>A variant only.  We discussed that only 5-10% of cancers are associated with a Hereditary  cancer predisposition syndrome.  One of the most common hereditary cancer syndromes that increases breast cancer risk is called Hereditary Breast and Ovarian Cancer (HBOC) syndrome.  This syndrome is caused by mutations in the BRCA1 and BRCA2 genes.  This syndrome increases an individual's lifetime risk to develop breast, ovarian, pancreatic, and other types of cancer.  There are also many other cancer predisposition syndromes caused by mutations in several other genes.  We discussed that if she is found to have a mutation in one of these genes, it may impact future medical management recommendations such as increased cancer screenings and consideration of risk reducing surgeries.  A positive result could also have implications for the patient's family members.  A Negative result would mean we were unable to identify a hereditary component to her cancer, but does not rule out the possibility of a hereditary basis for her cancer.  There could be mutations that are undetectable by current technology, or in genes not yet tested or identified to  increase cancer risk.    We discussed the potential to find a Variant of Uncertain Significance or VUS.  These are variants that have not yet been identified as pathogenic or benign, and it is unknown if this variant is associated with increased cancer risk or if this is a normal finding.  Most VUS's are reclassified to benign or likely benign.   It should not be used to make medical management decisions. With time, we suspect the lab will determine the significance of any VUS's identified if any.   Based on Ashley Conrad's personal and family history of cancer, she meets NCCN medical criteria for genetic testing. Despite that she meets criteria, her insurance may or may not cover testing, and she may still have an out of pocket cost.  The laboratory can provide her with an estimate of her OOP cost.   PLAN: After considering the risks, benefits, and limitations, Ashley Conrad  provided informed consent to pursue genetic testing and the blood sample was sent to [facility] for analysis of the Common Hereditary Cancers Panel. Results should be available within approximately 2-3 weeks' time, at which point they will be disclosed by telephone to Ashley Conrad, as will any additional recommendations warranted by these results. Ashley Conrad will receive a summary of her genetic counseling visit and a copy of her results once available. This information will also be available in Epic. We encouraged Ashley Conrad to remain in contact with cancer genetics annually so that we can continuously update the family history and inform her of any changes in cancer genetics and testing that may be of benefit for her family. Ashley Conrad questions were answered to her satisfaction today. Our contact information was provided should additional questions or concerns arise.  Based on Ms. Bettcher's family history, we recommended her siblings/maternal relatives, also, have genetic counseling and testing. Ms.  Wonders will let us know if we can be of any assistance in coordinating genetic counseling and/or testing for this family member.   Lastly, we encouraged Ms. Slaubaugh to remain in contact with cancer genetics annually so that we can continuously update the family history and inform her of any changes in cancer genetics and testing that may be of benefit for this family.   Ms.  Shimko questions were answered to her satisfaction today. Our contact information was provided should additional questions or concerns arise. Thank you for the referral and allowing [place] to share in the care of your patient.  Tana Felts, MS, [facility] Certified Genetic Counselor [email or web address] phone: [phone]  The patient was seen for a total of 35 minutes in face-to-face genetic counseling.  The patient was accompanied today by her husband and granddaughter.  This patient was discussed with Drs. Magrinat, Lindi Adie and/or Burr Medico who agrees with the above.

## 2017-08-17 ENCOUNTER — Telehealth: Payer: Self-pay | Admitting: *Deleted

## 2017-08-17 NOTE — Telephone Encounter (Signed)
Spoke to pt concerning [place] from 6.12.19. Denies questions or concerns regarding dx or treatment care plan. Encourage pt to call with needs. Received verbal understanding.

## 2017-08-21 ENCOUNTER — Ambulatory Visit: Payer: Self-pay | Admitting: General Surgery

## 2017-08-24 ENCOUNTER — Telehealth: Payer: Self-pay | Admitting: Genetics

## 2017-08-24 NOTE — Telephone Encounter (Signed)
Revealed negative genetic testing.   This normal result is reassuring and indicates that it is unlikely Ashley Conrad's cancer is due to a hereditary cause.  However, there is still a significant personal and family that should still be considered Genetic testing is not perfect, and cannot definitively rule out a hereditary cause.  It will be important for her to keep in contact with genetics to learn if any additional testing may be needed in the future.     Recommended her family members tell their doctors about the family history of cancer so they can make the best individual screening recommendations for them.  Recommended maternal realties also have genetic testing due to mother's hx of breast cancer at 33.

## 2017-08-31 ENCOUNTER — Telehealth: Payer: Self-pay | Admitting: Oncology

## 2017-08-31 ENCOUNTER — Encounter: Payer: Self-pay | Admitting: Genetics

## 2017-08-31 ENCOUNTER — Ambulatory Visit: Payer: Self-pay | Admitting: Genetics

## 2017-08-31 DIAGNOSIS — Z853 Personal history of malignant neoplasm of breast: Secondary | ICD-10-CM

## 2017-08-31 DIAGNOSIS — Z800 Family history of malignant neoplasm of digestive organs: Secondary | ICD-10-CM

## 2017-08-31 DIAGNOSIS — Z1379 Encounter for other screening for genetic and chromosomal anomalies: Secondary | ICD-10-CM | POA: Insufficient documentation

## 2017-08-31 DIAGNOSIS — Z8042 Family history of malignant neoplasm of prostate: Secondary | ICD-10-CM

## 2017-08-31 DIAGNOSIS — C50411 Malignant neoplasm of upper-outer quadrant of right female breast: Secondary | ICD-10-CM

## 2017-08-31 DIAGNOSIS — Z170 Estrogen receptor positive status [ER+]: Secondary | ICD-10-CM

## 2017-08-31 DIAGNOSIS — Z803 Family history of malignant neoplasm of breast: Secondary | ICD-10-CM

## 2017-08-31 NOTE — Progress Notes (Signed)
HPI:  Ms. Talerico was previously seen in the [facility] clinic on 08/14/2017 due to a personal and family history of cancer and concerns regarding a hereditary predisposition to cancer. Please refer to our prior cancer genetics clinic note for more information regarding Ms. Kingston's medical, social and family histories, and our assessment and recommendations, at the time. Ms. Slaven recent genetic test results were disclosed to her, as well as recommendations warranted by these results. These results and recommendations are discussed in more detail below.  CANCER HISTORY:    Malignant neoplasm of upper-outer quadrant of right breast in female, estrogen receptor positive ([place])   08/03/2017 Initial Diagnosis    Malignant neoplasm of upper-outer quadrant of right breast in female, estrogen receptor positive ([place])      08/21/2017 Genetic Testing    The Common Hereditary Cancer Panel offered by [facility] includes sequencing and/or deletion duplication testing of the following 47 genes: APC, ATM, AXIN2, BARD1, BMPR1A, BRCA1, BRCA2, BRIP1, CDH1, CDKN2A (p14ARF), CDKN2A (p16INK4a), CKD4, CHEK2, CTNNA1, DICER1, EPCAM (Deletion/duplication testing only), GREM1 (promoter region deletion/duplication testing only), KIT, MEN1, MLH1, MSH2, MSH3, MSH6, MUTYH, NBN, NF1, NHTL1, PALB2, PDGFRA, PMS2, POLD1, POLE, PTEN, RAD50, RAD51C, RAD51D, SDHB, SDHC, SDHD, SMAD4, SMARCA4. STK11, TP53, TSC1, TSC2, and VHL.  The following genes were evaluated for sequence changes only: SDHA and HOXB13 c.251G>A variant only.  Results: Negative, no pathogenic variants identified.  The date of this test report is 08/21/2017.         FAMILY HISTORY:  We obtained a detailed, 4-generation family history.  Significant diagnoses are listed below: Family History  Problem Relation Age of Onset  . Breast cancer Mother 37  . Prostate cancer Father 57       had radiation  . Prostate cancer Brother 43       has  surgery  . Stomach cancer Paternal Aunt        early 36's  . Diabetes Maternal Grandmother   . Stomach cancer Paternal Aunt        early 67's    Ms. Yarrow has a 55 year-old daughter and a 76 year-old son. No history of cancer.  Ms. Nault has a granddaughter and a grandson.  Ms. Rowlands has 4 sisters and 2 brothers: -76 sisters range in ages from 28's-70's with no history of cancer.  -1 brother is in his 81's and was diagnosed with prostate cancer at 46  He had surgery to treat this.  -1 brother has no history of cancer.   Ms. Wahlberg father: died at 61, he had a history of prostate cancer at 70.  He had radiation treatment for this.  Paternal Aunts/Uncles: 6 paternal aunts, 2 had stomach cancer in their early 15's.  Patient also has 7 paternal uncles with no history of cancer.  Paternal cousins: no history of cancer.  Paternal grandfather: died in old age with no history of cancer.  Paternal grandmother:died in old age with no history of cancer.   Ms. Scovell mother: died at 52, she had a history of breast cancer that was diagnosed when she was 58.  She had her uterus and ovaries removed during her lifetime.  Maternal Aunts/Uncles: 3 maternal aunts, 3 maternal uncles, no history of cancer.  Maternal cousins: no history of cancer.  Maternal grandfather: died in old age with no history of cancer.  Maternal grandmother:died in middle age, had diabetes  Ms. Rosman is unaware of previous family history of genetic testing for hereditary cancer  risks. Patient's ancestors are of African American/[place]asian/Native American descent (ancestry DNA testing),  There is no reported Ashkenazi Jewish ancestry. There is no known consanguinity.  GENETIC TEST RESULTS: Genetic testing performed through [facility]'s Common Hereditary Cancers Panel reported out on 08/21/2017 showed no pathogenic mutations. The Common Hereditary Cancer Panel offered by [facility] includes sequencing  and/or deletion duplication testing of the following 47 genes: APC, ATM, AXIN2, BARD1, BMPR1A, BRCA1, BRCA2, BRIP1, CDH1, CDKN2A (p14ARF), CDKN2A (p16INK4a), CKD4, CHEK2, CTNNA1, DICER1, EPCAM (Deletion/duplication testing only), GREM1 (promoter region deletion/duplication testing only), KIT, MEN1, MLH1, MSH2, MSH3, MSH6, MUTYH, NBN, NF1, NHTL1, PALB2, PDGFRA, PMS2, POLD1, POLE, PTEN, RAD50, RAD51C, RAD51D, SDHB, SDHC, SDHD, SMAD4, SMARCA4. STK11, TP53, TSC1, TSC2, and VHL.  The following genes were evaluated for sequence changes only: SDHA and HOXB13 c.251G>A variant only..  The test report will be scanned into EPIC and will be located under the Molecular Pathology section of the Results Review tab. A portion of the result report is included below for reference.     We discussed with Ms. Verrastro that because current genetic testing is not perfect, it is possible there may be a gene mutation in one of these genes that current testing cannot detect, but that chance is small.  We also discussed, that there could be another gene that has not yet been discovered, or that we have not yet tested, that is responsible for the cancer diagnoses in the family. It is also possible there is a hereditary cause for the cancer in the family that Ms. Shur did not inherit and therefore was not identified in her testing.  Therefore, it is important to remain in touch with cancer genetics in the future so that we can continue to offer Ms. Cerutti the most up to date genetic testing.   ADDITIONAL GENETIC TESTING: We discussed with Ms. Mannes that there are other genes that are associated with increased cancer risk that can be analyzed. The laboratories that offer this testing look at these additional genes via a hereditary cancer gene panel. Should Ms. Payano wish to pursue additional genetic testing, we are happy to discuss and coordinate this testing, at any time.    CANCER SCREENING RECOMMENDATIONS: Ms.  Juma test result is negative (normal).  This means that we have not identified a hereditary cause for her personal and family history of cancer at this time.   While reassuring, this does not definitively rule out a hereditary predisposition to cancer. It is still possible that there could be genetic mutations that are undetectable by current technology, or genetic mutations in genes that have not been tested or identified to increase cancer risk.  Therefore, it is recommended she continue to follow the cancer management and screening guidelines provided by her oncology and primary healthcare provider. An individual's cancer risk is not determined by genetic test results alone.  Overall cancer risk assessment includes additional factors such as personal medical history, family history, etc.  These should be used to make a personalized plan for cancer prevention and surveillance.    RECOMMENDATIONS FOR FAMILY MEMBERS:  Relatives in this family might be at some increased risk of developing cancer, over the general population risk, simply due to the family history of cancer.  We recommended women in this family have a yearly mammogram beginning at age 95, or 23 years younger than the earliest onset of cancer, an annual clinical breast exam, and perform monthly breast self-exams. Women in this family should also have a gynecological exam as  recommended by their primary provider. All family members should have a colonoscopy by age 35 (or as directed by their doctors).  All family members should inform their physicians about the family history of cancer so their doctors can make the most appropriate screening recommendations for them.   It is also possible there is a hereditary cause for the cancer in Ms. Hendricks's family that she did not inherit and therefore was not identified in her.  We recommended her siblings/maternal relatives also have genetic counseling and testing. Ms. Oxendine will let us  know if we can be of any assistance in coordinating genetic counseling and/or testing for these family members.   FOLLOW-UP: Lastly, we discussed with Ms. Aguado that cancer genetics is a rapidly advancing field and it is possible that new genetic tests will be appropriate for her and/or her family members in the future. We encouraged her to remain in contact with cancer genetics on an annual basis so we can update her personal and family histories and let her know of advances in cancer genetics that may benefit this family.   Our contact number was provided. Ms. Gudino questions were answered to her satisfaction, and she knows she is welcome to call us at anytime with additional questions or concerns.   Ferol Luz, MS, [facility] Certified Genetic Counselor [email or web address]

## 2017-08-31 NOTE — Telephone Encounter (Signed)
Called regarding 8/16

## 2017-09-11 ENCOUNTER — Other Ambulatory Visit (HOSPITAL_COMMUNITY): Payer: Self-pay | Admitting: General Surgery

## 2017-09-11 DIAGNOSIS — Z01818 Encounter for other preprocedural examination: Secondary | ICD-10-CM

## 2017-09-12 ENCOUNTER — Ambulatory Visit (HOSPITAL_COMMUNITY)
Admission: RE | Admit: 2017-09-12 | Discharge: 2017-09-12 | Disposition: A | Discharge status: Home / Self Care | Payer: Medicare Other | Source: Ambulatory Visit | Attending: General Surgery | Admitting: General Surgery

## 2017-09-12 DIAGNOSIS — Z01818 Encounter for other preprocedural examination: Secondary | ICD-10-CM | POA: Diagnosis not present

## 2017-09-12 NOTE — Progress Notes (Signed)
  Echocardiogram 2D Echocardiogram has been performed.  Ashley Conrad 09/12/2017, 2:04 PM

## 2017-09-13 ENCOUNTER — Other Ambulatory Visit: Payer: Self-pay

## 2017-09-13 ENCOUNTER — Encounter (HOSPITAL_COMMUNITY)
Admission: RE | Admit: 2017-09-13 | Discharge: 2017-09-13 | Disposition: A | Discharge status: Home / Self Care | Payer: Medicare Other | Source: Ambulatory Visit | Attending: General Surgery | Admitting: General Surgery

## 2017-09-13 ENCOUNTER — Encounter (HOSPITAL_COMMUNITY): Payer: Self-pay | Admitting: *Deleted

## 2017-09-13 DIAGNOSIS — C50411 Malignant neoplasm of upper-outer quadrant of right female breast: Secondary | ICD-10-CM | POA: Diagnosis not present

## 2017-09-13 DIAGNOSIS — J449 Chronic obstructive pulmonary disease, unspecified: Secondary | ICD-10-CM | POA: Diagnosis not present

## 2017-09-13 DIAGNOSIS — Z01812 Encounter for preprocedural laboratory examination: Secondary | ICD-10-CM | POA: Insufficient documentation

## 2017-09-13 DIAGNOSIS — I10 Essential (primary) hypertension: Secondary | ICD-10-CM | POA: Diagnosis not present

## 2017-09-13 DIAGNOSIS — Z800 Family history of malignant neoplasm of digestive organs: Secondary | ICD-10-CM | POA: Diagnosis not present

## 2017-09-13 DIAGNOSIS — Z8042 Family history of malignant neoplasm of prostate: Secondary | ICD-10-CM | POA: Insufficient documentation

## 2017-09-13 DIAGNOSIS — Z803 Family history of malignant neoplasm of breast: Secondary | ICD-10-CM | POA: Diagnosis not present

## 2017-09-13 HISTORY — DX: Headache: R51

## 2017-09-13 HISTORY — DX: Gastro-esophageal reflux disease without esophagitis: K21.9

## 2017-09-13 HISTORY — DX: Malignant (primary) neoplasm, unspecified: C80.1

## 2017-09-13 HISTORY — DX: Cardiac murmur, unspecified: R01.1

## 2017-09-13 HISTORY — DX: Headache, unspecified: R51.9

## 2017-09-13 HISTORY — DX: Dependence on supplemental oxygen: Z99.81

## 2017-09-13 HISTORY — DX: Dyspnea, unspecified: R06.00

## 2017-09-13 LAB — BASIC METABOLIC PANEL
ANION GAP: 9 (ref 5–15)
BUN: 8 mg/dL (ref 8–23)
CO2: 27 mmol/L (ref 22–32)
Calcium: 9 mg/dL (ref 8.9–10.3)
Chloride: 105 mmol/L (ref 98–111)
Creatinine, Ser: 0.7 mg/dL (ref 0.44–1.00)
Glucose, Bld: 124 mg/dL — ABNORMAL HIGH (ref 70–99)
POTASSIUM: 3.7 mmol/L (ref 3.5–5.1)
Sodium: 141 mmol/L (ref 135–145)

## 2017-09-13 LAB — CBC
HCT: 46.2 % — ABNORMAL HIGH (ref 36.0–46.0)
Hemoglobin: 13.5 g/dL (ref 12.0–15.0)
MCH: 31 pg (ref 26.0–34.0)
MCHC: 29.2 g/dL — ABNORMAL LOW (ref 30.0–36.0)
MCV: 106.2 fL — ABNORMAL HIGH (ref 78.0–100.0)
PLATELETS: 235 10*3/uL (ref 150–400)
RBC: 4.35 MIL/uL (ref 3.87–5.11)
RDW: 12.2 % (ref 11.5–15.5)
WBC: 9.2 10*3/uL (ref 4.0–10.5)

## 2017-09-13 NOTE — Pre-Procedure Instructions (Signed)
Ashley Conrad  09/13/2017      Your procedure is scheduled on Tuesday, July 23..  Report to [facility] Admitting at 5:30 A.M.              Your surgery or procedure is scheduled for 7:30 AM   Call this number if you have problems the morning of surgery:[phone]- the pre- op desk     Remember:  Do not eat after midnight.  You may drink clear liquids until 4:30 AM  Clear liquids allowed are:     Water, Juice (non-citric and without pulp), Carbonated beverages, Clear Tea, Black Coffee only, Plain Jell-O only, Gatorade and Plain Popsicles only    Take these medicines the morning of surgery with A SIP OF WATER: atorvastatin (LIPITOR)  cetirizine (ZYRTEC)  diltiazem (TIAZAC) DULoxetine (CYMBALTA)  omeprazole (PRILOSEC)   Use SPIRIVA RESPIMAT  and  budesonide-formoterol (SYMBICORT)   Use albuterol (PROVENTIL HFA;VENTOLIN HFA) if needed and bring it to the hospital with you.  May take if needed:  fluticasone (FLONASE) 50 MCG/ACT nasal spray gabapentin (NEURONTIN)   STOP taking Aspirin, Aspirin Products (Goody Powder, Excedrin Migraine), Ibuprofen (Advil), Naproxen (Aleve), Vitamins and Herbal Products (ie Fish Oil)  Special instructions:  [place]- Preparing For Surgery  Before surgery, you can play an important role. Because skin is not sterile, your skin needs to be as free of germs as possible. You can reduce the number of germs on your skin by washing with CHG (chlorahexidine gluconate) Soap before surgery.  CHG is an antiseptic cleaner which kills germs and bonds with the skin to continue killing germs even after washing.    Oral Hygiene is also important to reduce your risk of infection.  Remember - BRUSH YOUR TEETH THE MORNING OF SURGERY WITH YOUR REGULAR TOOTHPASTE  Please do not use if you have an allergy to CHG or antibacterial soaps. If your skin becomes reddened/irritated stop using the CHG.  Do not shave (including legs and underarms) for at  least 48 hours prior to first CHG shower. It is OK to shave your face.  Please follow these instructions carefully.   1. Shower the NIGHT BEFORE SURGERY and the MORNING OF SURGERY with CHG.   2. If you chose to wash your hair, wash your hair first as usual with your normal shampoo.  3. After you shampoo, rinse your hair and body thoroughly to remove the shampoo.  4. Use CHG as you would any other liquid soap. You can apply CHG directly to the skin and wash gently with a scrungie or a clean washcloth.   5. Apply the CHG Soap to your body ONLY FROM THE NECK DOWN.  Do not use on open wounds or open sores. Avoid contact with your eyes, ears, mouth and genitals (private parts). Wash Face and genitals (private parts)  with your normal soap.  6. Wash thoroughly, paying special attention to the area where your surgery will be performed.  7. Thoroughly rinse your body with warm water from the neck down.  8. DO NOT shower/wash with your normal soap after using and rinsing off the CHG Soap.  9. Pat yourself dry with a CLEAN TOWEL.  10. Wear CLEAN PAJAMAS to bed the night before surgery, wear comfortable clothes the morning of surgery  11. Place CLEAN SHEETS on your bed the night of your first shower and DO NOT SLEEP WITH PETS.    Day of Surgery: Shower as above Do not apply any  deodorants/lotions.  Please wear clean clothes to the hospital/surgery center.   Remember to brush your teeth WITH YOUR REGULAR TOOTHPASTE.   Do not wear jewelry, make-up or nail polish.  Do not wear lotions, powders, or perfumes, or deodorant.  Do not shave 48 hours prior to surgery.  Men may shave face and neck.  Do not bring valuables to the hospital.  [facility] is not responsible for any belongings or valuables.  Contacts, dentures or bridgework may not be worn into surgery.  Leave your suitcase in the car.  After surgery it may be brought to your room.  For patients admitted to the hospital, discharge time  will be determined by your treatment team.  Patients discharged the day of surgery will not be allowed to drive home.  Please read over the following fact sheets that you were given.

## 2017-09-13 NOTE — Progress Notes (Signed)
REQUESTED EKG TRACING FROM [facility].

## 2017-09-13 NOTE — Progress Notes (Signed)
Anesthesia PAT Evaluation:   Case:  [id number] Date/Time:  09/18/17 0715   Procedure:  RIGHT BREAST LUMPECTOMY WITH RADIOACTIVE SEED LOCALIZATION (Right Breast)   Anesthesia type:  General   Pre-op diagnosis:  CANCER UPPER OUTER RIGHT BREAST   Location:  [facility] OR ROOM 01 / [place] [facility]   Surgeon:  Excell Seltzer, MD      DISCUSSION: Patient is a 72 year old female scheduled for the above procedure. History includes former smoker (quit '14), severe COPD ([place], 4L/[place]; first exacerbation 07/2016 requiring hospitalization/BiPAP, 2nd 06/2017 managed out-patient), HTN, right breast cancer (s/p lumpectomy with full axillary LN dissection/chemoradiation '89; recurrence 06/2017). Breast conserving surgery, oncotype and genetic testing, and antiestrogens are planned for treatment of recurrent right breast cancer.   Last pulmonology visit was on 08/13/17. Patient with worsening dyspnea (when compared to 6 months ago), now SOB when walking around the house. She reported routine desaturations in the low 80s to high 70s with exertion. Currently she is on 4 L at rest and with 5 L on exertion. She said on the occasions where she is in the mid 80s despite oxygen she feels no different than when she is in the low 90s. Today she reports she feels at baseline from a pulmonary standpoint. No wheezing or persistent cough. She is compliant with her pulmonary regimen. She feels she can breath comfortably in a stationary position, but breathing is "horrible" with activity but feels some of that is related to deconditioning as well.   Perioperative pulmonology risk assessment with recommendations as outlined below. Echo done 09/12/17 and showed normal RV function. LVEF 50-55% with no wall motion abnormalities. No significant valvular disease. Case reviewed with anesthesiologist Roberts Gaudy, MD who also evaluated Mrs. Burkel. Her husband was also present. General anesthesia is anticipated with pectoral block. He did discuss risks  including possible need for continued ventilator support postoperatively. Dr. Linna Caprice plans to assign himself to this case. He would like an ABG done on the morning of surgery. In the meantime, patient is aware to let her providers know if there is a change in her status as we want her medically optimized for surgery.    VS: BP 113/85   Pulse (!) 105 Comment: notified Jenny RN  Temp 36.9 C   Resp 20   Ht 5' (1.524 m)   SpO2 90%   BMI 33.73 kg/m  Repeat HR 103. She in a hospital wheelchair. NAD. No conversational dyspnea noted. Lungs were diminished, particularly at bases, but no wheezes, crackles, or rhonchi noted. Heart RRR, somewhat distant. I did not appreciate any murmurs. No ankle edema. She wears dentures.  PROVIDERS: Raelene Bott, MD is PCP [facility]; see Care Everywhere). Last visit 07/12/17 with Priscille Kluver, [place] for COPD exacerbation follow-up.  Lenward Chancellor, MD is HEM-ONC. Last visit 08/08/17.  Leia Alf, MD (attending) is her pulmonologist ([place]). Last visit 08/13/17 by Larey Days, MD (resident). He was aware of surgery plans. In office, she had long recovery after walking in on 4L with highest SpO2 89%. Suspect worsening COPD with more rapid decline in lung function in setting of recent exacerbations. Have ordered a 6MWT at next visit to ensure she is getting adequate oxygen. Offered to do this today but she declined. He switched patient from Spiriva to respimat in order to attempt to get more medicine to lungs. She had 5 mm bilateral pulmonology nodules on 08/2016 imaging, but no plans to repeat  until breast cancer management is worked out first.  In regards to upcoming surgery, he wrote,  "I do not give clearance for surgery but rather provide risk assessment. ARISCAT score for postoperative pulmonary complications is 44 which indicates a 13.3% risk of in hospital post operative pulmonary complications or intermediate  risk. Based on the patient's type of surgery, ASA class, emergent nature of surgery, functional status, and presence of SIRS or sepsis, the postoperative respiratory failure risk calculator estimates the patient's risk of postoperative respiratory failure at 0.7%. Given her significant obstruction, worsening symptoms, worsening hypoxemia, I would be very wary of general anesthesia. Discussed at length the concern that she may not be able to be liberated from the ventilator. Would strongly recommend high thoracic epidural and local anesthetic if possible. Will reach out and discuss with her surgeon. If they elect to move forward with general anesthesia, highly recommend a repeat echocardiogram to assess for progression of pulmonary HTN and evaluate RV function prior to procedure. Assessment: Risk of pulmonary complication = 73.7 Risk of postoperative respiratory failure = 0.7% Recommendations: - Incentive spirometry/lung expansion maneuvers to prevent pneumonia, early physical therapy - Optimize postoperative O2 saturation with goal saturation between 92-95% - Would pre-treat with scheduled albuterol/ipratropium nebs for wheezing, and continue after surgery  - Would recommend a pre-extubation blood gas to assess CO2 retention, and then a post-extubation blood gas for the same reason - It is wise to extubated to BiPAP given COPD, especially if patient is retaining CO2"   - She is not routinely followed by a cardiologist, but she did see Kandis Cocking, MD in 2014 for possible abnormal stress test. Ultimately a decision was made not to pursue cardiac cath (see below for more details). Patient reports testing was done as part of her dyspnea work-up, but was told pulmonology etiology suspected and was referred to pulmonary rehab which she has completed twice.   LABS: Labs reviewed: Acceptable for surgery. Per Dr. Guss Bunde ABG on the day of surgery.  (all labs ordered are listed, but only abnormal results  are displayed)  Labs Reviewed  CBC - Abnormal; Notable for the following components:      Result Value   HCT 46.2 (*)    MCV 106.2 (*)    MCHC 29.2 (*)    All other components within normal limits  BASIC METABOLIC PANEL - Abnormal; Notable for the following components:   Glucose, Bld 124 (*)    All other components within normal limits    OTHER: Pulmonary Function Tests10/21/14 (as outlined by [facility] Pulmonology): "Spirometry demonstrates severe obstruction and suggested restrictive disease versus gas trapping. Lung volumes consistent with gas trapping. DLCO severely reduced."   IMAGES: CT chest 01/19/17 ([facility]): FINDINGS: - Cardiovascular: The heart is normal in size. No pericardial effusion. - No evidence of thoracic aortic aneurysm. Mild atherosclerotic calcifications the aortic arch. - Coronary atherosclerosis of the LAD and right coronary artery. - Mediastinum/Nodes: Small mediastinal lymph nodes which do not meet pathologic CT size criteria. - No suspicious hilar or axillary lymphadenopathy. - Visualized thyroid is unremarkable. - Lungs/Pleura: Moderate centrilobular and paraseptal emphysematous changes, upper lobe predominant. - No focal consolidation. - No suspicious pulmonary nodules. - No pleural effusion or pneumothorax. - Musculoskeletal: Postsurgical changes in the lateral right breast. - Severe compression fracture deformity at T4, mild inferior endplate compression fracture deformity at T3, and mild superior endplate compression fracture at T12, chronic.   EKG: 01/20/17 ([place]): Result Narrative ONLY: (Tracing  requested) Sinus tachycardia at 117 bpm. Low voltage QRS.   CV: Echo 09/12/17: Study Conclusions - Left ventricle: The cavity size was at the lower limits of   normal. Systolic function was normal. The estimated ejection   fraction was in the range of 50% to 55%. Wall motion was normal;   there were no  regional wall motion abnormalities. - Aortic valve: There was trivial regurgitation. - Mitral valve: There was no regurgitation. - Left atrium: The atrium was moderately dilated. - Right atrium: The atrium was mildly to moderately dilated. - Right ventricle: The cavity size was normal. Wall thickness was normal.    Systolic function was normal. TAPSE 16.1 mm.  - Tricuspid valve: There was no significant regurgitation. - Pulmonic valve: There was trivial regurgitation. Impressions: - Technically difficult study due to respiratory interference and   tachycardia. No significant abnormalities noted; normal EF,   trivial valvular regurgitation.   According to 09/05/12 office note by Kandis Cocking, MD (Care Everywhere): Echocardiogram: Echocardiogram from 05/21/2012 was reviewed. Patient is on an ejection fraction of 27-25% with diastolic dysfunction. Small moderate pulmonary hypertension with PA pressure of 40-45 mmHg. 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 Nuclear stress test: Nuclear study from 05/20/2012 was reviewed. Patient is known to have a moderately sized, moderately severe partial reversible defect of the mid anterior apical anterior apical and apical lateral wall suggestive of possible scar with peri-infarct ischemia or artifact. Patient also noted to have a small, mild fixed mid lateral defects just of of attenuation. Patient is noted to have moderate breast attenuation. (In 06/06/12 note by Kandis Cocking, MD, he wrote, "Patient's nuclear stress test which was limited by attenuation artifact indicated some evidence of scar but no large territory of ischemia. We discussed the options of pursuing cardiac catheterization or treating her medically for coronary artery disease continuing her in pulmonary rehabilitation since she is having improvement. At this time I recommended the patient continue with pulmonary rehabilitation and medical therapy for coronary artery disease as I do not  believe we would produce significant improvement in her symptoms to cardiac catheterization in view that she had minimal reversibility to her nuclear study. I believe the majority of her dyspnea on exertion secondary to COPD and she should continue aggressive treatment of this disease entity for improvement of her symptoms.")   Past Medical History:  Diagnosis Date  . COPD (chronic obstructive pulmonary disease) ([place])   . Family history of breast cancer   . Family history of prostate cancer   . Family history of stomach cancer   . History of right breast cancer   . Hypertension   . Personal history of breast cancer 08/15/2017    Past Surgical History:  Procedure Laterality Date  . APPENDECTOMY    . right lumpectomy    . TONSILECTOMY, ADENOIDECTOMY, BILATERAL MYRINGOTOMY AND TUBES      MEDICATIONS: . albuterol (PROVENTIL HFA;VENTOLIN HFA) 108 (90 Base) MCG/ACT inhaler  . atorvastatin (LIPITOR) 10 MG tablet  . budesonide-formoterol (SYMBICORT) 160-4.5 MCG/ACT inhaler  . cetirizine (ZYRTEC) 10 MG tablet  . denosumab (PROLIA) 60 MG/ML SOSY injection  . diltiazem (TIAZAC) 240 MG 24 hr capsule  . DULoxetine (CYMBALTA) 30 MG capsule  . Ergocalciferol (VITAMIN D2 PO)  . fluconazole (DIFLUCAN) 100 MG tablet  . fluticasone (FLONASE) 50 MCG/ACT nasal spray  . gabapentin (NEURONTIN) 300 MG capsule  . Magnesium Oxide 400 (240 Mg) MG TABS  . montelukast (SINGULAIR) 10 MG tablet  . omeprazole (  PRILOSEC) 20 MG capsule  . SPIRIVA RESPIMAT 2.5 MCG/ACT AERS   No current facility-administered medications for this encounter.     George Hugh [facility] Short Stay Center/Anesthesiology Phone [phone] 09/13/2017 2:42 PM

## 2017-09-14 NOTE — Consult Note (Signed)
Anesthesiology note:  Ashley Conrad is a 72 year old female with severe COPD on 4 L home oxygen.She is scheduled to undergo right breast lumpectomy with radioactive seed localization on 7/23 by Dr. Excell Seltzer.  As noted she was hospitalized in June 2018 with a severe COPD exacerbation requiring BiPAP.  She has  severe shortness of breath walking around the house with routine O2 desaturations in the low 80s and high 70s with exertion.  Echocardiogram on 09/12/2017 revealed normal right ventricular size and systolic function with TAPSE 16.1 mm.  Left ventricular EF was 50 to 55% and there were no valvular abnormalities.  On examination she was wheelchair-bound with no visible shortness of breath at rest.  She was on a home oxygen cannula at 4 L/min.  She stated she is in her usual state of health at the present time with no with recent worsening of cough, wheezing , or SOB.  Heart regular rate and rhythm Lungs decreased breath sounds with few mild end expiratory wheezes. No peripheral edema  Impression: Severe COPD in 72 year old female on 4 L home oxygen who is scheduled to undergo right breast lumpectomy.  She appears to be at her baseline of lung function with no correctable risk factors.  I explained the anesthetic procedures which will include a pec block and general anesthesia.  I explained there was a 25% chance that she would need to remain intubated postoperatively.  She understood these risks and agreed to proceed with the surgery as scheduled.  Plan: check ABG on AM of surgery.  Roberts Gaudy

## 2017-09-18 ENCOUNTER — Encounter (HOSPITAL_COMMUNITY)
Admission: RE | Disposition: A | Discharge status: Home / Self Care | Payer: Self-pay | Source: Ambulatory Visit | Attending: General Surgery

## 2017-09-18 ENCOUNTER — Inpatient Hospital Stay (HOSPITAL_COMMUNITY)
Admission: RE | Admit: 2017-09-18 | Discharge: 2017-09-20 | DRG: 582 | Disposition: A | Discharge status: Home / Self Care | Payer: Medicare Other | Source: Ambulatory Visit | Attending: General Surgery | Admitting: General Surgery

## 2017-09-18 ENCOUNTER — Ambulatory Visit (HOSPITAL_COMMUNITY): Payer: Medicare Other | Admitting: Emergency Medicine

## 2017-09-18 ENCOUNTER — Encounter (HOSPITAL_COMMUNITY): Payer: Self-pay | Admitting: Urology

## 2017-09-18 ENCOUNTER — Other Ambulatory Visit: Payer: Self-pay

## 2017-09-18 ENCOUNTER — Ambulatory Visit (HOSPITAL_COMMUNITY): Payer: Medicare Other | Admitting: Anesthesiology

## 2017-09-18 DIAGNOSIS — Z9119 Patient's noncompliance with other medical treatment and regimen: Secondary | ICD-10-CM

## 2017-09-18 DIAGNOSIS — Z803 Family history of malignant neoplasm of breast: Secondary | ICD-10-CM

## 2017-09-18 DIAGNOSIS — K5790 Diverticulosis of intestine, part unspecified, without perforation or abscess without bleeding: Secondary | ICD-10-CM | POA: Diagnosis present

## 2017-09-18 DIAGNOSIS — C50919 Malignant neoplasm of unspecified site of unspecified female breast: Secondary | ICD-10-CM | POA: Diagnosis present

## 2017-09-18 DIAGNOSIS — Z9221 Personal history of antineoplastic chemotherapy: Secondary | ICD-10-CM

## 2017-09-18 DIAGNOSIS — Z8711 Personal history of peptic ulcer disease: Secondary | ICD-10-CM

## 2017-09-18 DIAGNOSIS — I10 Essential (primary) hypertension: Secondary | ICD-10-CM | POA: Diagnosis present

## 2017-09-18 DIAGNOSIS — Z170 Estrogen receptor positive status [ER+]: Secondary | ICD-10-CM

## 2017-09-18 DIAGNOSIS — E872 Acidosis: Secondary | ICD-10-CM | POA: Diagnosis not present

## 2017-09-18 DIAGNOSIS — Z8249 Family history of ischemic heart disease and other diseases of the circulatory system: Secondary | ICD-10-CM

## 2017-09-18 DIAGNOSIS — J95821 Acute postprocedural respiratory failure: Secondary | ICD-10-CM | POA: Diagnosis not present

## 2017-09-18 DIAGNOSIS — Z9981 Dependence on supplemental oxygen: Secondary | ICD-10-CM

## 2017-09-18 DIAGNOSIS — K219 Gastro-esophageal reflux disease without esophagitis: Secondary | ICD-10-CM | POA: Diagnosis present

## 2017-09-18 DIAGNOSIS — J9690 Respiratory failure, unspecified, unspecified whether with hypoxia or hypercapnia: Secondary | ICD-10-CM

## 2017-09-18 DIAGNOSIS — C50411 Malignant neoplasm of upper-outer quadrant of right female breast: Principal | ICD-10-CM | POA: Diagnosis present

## 2017-09-18 DIAGNOSIS — C50911 Malignant neoplasm of unspecified site of right female breast: Secondary | ICD-10-CM | POA: Diagnosis present

## 2017-09-18 DIAGNOSIS — Z8042 Family history of malignant neoplasm of prostate: Secondary | ICD-10-CM

## 2017-09-18 DIAGNOSIS — Z87891 Personal history of nicotine dependence: Secondary | ICD-10-CM

## 2017-09-18 DIAGNOSIS — J449 Chronic obstructive pulmonary disease, unspecified: Secondary | ICD-10-CM | POA: Diagnosis present

## 2017-09-18 DIAGNOSIS — Z923 Personal history of irradiation: Secondary | ICD-10-CM

## 2017-09-18 HISTORY — PX: BREAST LUMPECTOMY WITH RADIOACTIVE SEED LOCALIZATION: SHX6424

## 2017-09-18 HISTORY — PX: MASTECTOMY, PARTIAL: SHX709

## 2017-09-18 LAB — POCT I-STAT 7, (LYTES, BLD GAS, ICA,H+H)
Acid-Base Excess: 4 mmol/L — ABNORMAL HIGH (ref 0.0–2.0)
BICARBONATE: 30.8 mmol/L — AB (ref 20.0–28.0)
Calcium, Ion: 1.27 mmol/L (ref 1.15–1.40)
HCT: 40 % (ref 36.0–46.0)
Hemoglobin: 13.6 g/dL (ref 12.0–15.0)
O2 Saturation: 85 %
PCO2 ART: 55.8 mmHg — AB (ref 32.0–48.0)
PO2 ART: 54 mmHg — AB (ref 83.0–108.0)
Potassium: 3.7 mmol/L (ref 3.5–5.1)
Sodium: 141 mmol/L (ref 135–145)
TCO2: 32 mmol/L (ref 22–32)
pH, Arterial: 7.349 — ABNORMAL LOW (ref 7.350–7.450)

## 2017-09-18 LAB — BLOOD GAS, ARTERIAL
ACID-BASE EXCESS: 3.4 mmol/L — AB (ref 0.0–2.0)
BICARBONATE: 30 mmol/L — AB (ref 20.0–28.0)
Drawn by: 533091
O2 CONTENT: 4 L/min
O2 SAT: 85.8 %
PATIENT TEMPERATURE: 97.9
PCO2 ART: 68.9 mmHg — AB (ref 32.0–48.0)
PO2 ART: 58.9 mmHg — AB (ref 83.0–108.0)
pH, Arterial: 7.26 — ABNORMAL LOW (ref 7.350–7.450)

## 2017-09-18 SURGERY — BREAST LUMPECTOMY WITH RADIOACTIVE SEED LOCALIZATION
Anesthesia: General | Site: Breast | Laterality: Right

## 2017-09-18 MED ORDER — DULOXETINE HCL 30 MG PO CPEP
30.0000 mg | ORAL_CAPSULE | Freq: Every day | ORAL | Status: DC
  Administered 2017-09-19 – 2017-09-20 (×2): 30 mg via ORAL
  Filled 2017-09-18 (×2): qty 1

## 2017-09-18 MED ORDER — LIDOCAINE 2% (20 MG/ML) 5 ML SYRINGE
INTRAMUSCULAR | Status: DC | PRN
  Administered 2017-09-18: 50 mg via INTRAVENOUS

## 2017-09-18 MED ORDER — LACTATED RINGERS IV SOLN
INTRAVENOUS | Status: DC | PRN
  Administered 2017-09-18: 07:00:00 via INTRAVENOUS

## 2017-09-18 MED ORDER — OXYCODONE HCL 5 MG PO TABS
5.0000 mg | ORAL_TABLET | ORAL | Status: DC | PRN
  Administered 2017-09-18 (×2): 10 mg via ORAL
  Filled 2017-09-18 (×2): qty 2

## 2017-09-18 MED ORDER — PANTOPRAZOLE SODIUM 40 MG PO TBEC
40.0000 mg | DELAYED_RELEASE_TABLET | Freq: Every day | ORAL | Status: DC
  Administered 2017-09-19 – 2017-09-20 (×2): 40 mg via ORAL
  Filled 2017-09-18 (×2): qty 1

## 2017-09-18 MED ORDER — PHENYLEPHRINE 40 MCG/ML (10ML) SYRINGE FOR IV PUSH (FOR BLOOD PRESSURE SUPPORT)
PREFILLED_SYRINGE | INTRAVENOUS | Status: AC
  Filled 2017-09-18: qty 10

## 2017-09-18 MED ORDER — MAGNESIUM OXIDE 400 (241.3 MG) MG PO TABS
200.0000 mg | ORAL_TABLET | Freq: Every day | ORAL | Status: DC
  Administered 2017-09-19 – 2017-09-20 (×2): 200 mg via ORAL
  Filled 2017-09-18 (×2): qty 1

## 2017-09-18 MED ORDER — FENTANYL CITRATE (PF) 100 MCG/2ML IJ SOLN
INTRAMUSCULAR | Status: DC | PRN
  Administered 2017-09-18 (×2): 25 ug via INTRAVENOUS

## 2017-09-18 MED ORDER — BUPIVACAINE-EPINEPHRINE 0.25% -1:200000 IJ SOLN
INTRAMUSCULAR | Status: DC | PRN
  Administered 2017-09-18: 20 mL

## 2017-09-18 MED ORDER — DILTIAZEM HCL ER COATED BEADS 240 MG PO CP24
240.0000 mg | ORAL_CAPSULE | Freq: Every day | ORAL | Status: DC
  Administered 2017-09-19 – 2017-09-20 (×2): 240 mg via ORAL
  Filled 2017-09-18 (×3): qty 1

## 2017-09-18 MED ORDER — ALBUTEROL SULFATE (2.5 MG/3ML) 0.083% IN NEBU: 2.5000 mg | INHALATION_SOLUTION | Freq: Two times a day (BID) | RESPIRATORY_TRACT | Status: DC | PRN

## 2017-09-18 MED ORDER — ALBUTEROL SULFATE (2.5 MG/3ML) 0.083% IN NEBU
INHALATION_SOLUTION | RESPIRATORY_TRACT | Status: AC
  Filled 2017-09-18: qty 3

## 2017-09-18 MED ORDER — ATORVASTATIN CALCIUM 10 MG PO TABS
10.0000 mg | ORAL_TABLET | Freq: Every day | ORAL | Status: DC
  Administered 2017-09-19 – 2017-09-20 (×2): 10 mg via ORAL
  Filled 2017-09-18 (×2): qty 1

## 2017-09-18 MED ORDER — PROPOFOL 10 MG/ML IV BOLUS
INTRAVENOUS | Status: DC | PRN
Start: 2017-09-18 — End: 2017-09-18
  Administered 2017-09-18: 150 mg via INTRAVENOUS
  Administered 2017-09-18: 50 mg via INTRAVENOUS

## 2017-09-18 MED ORDER — CELECOXIB 200 MG PO CAPS
200.0000 mg | ORAL_CAPSULE | ORAL | Status: AC
  Administered 2017-09-18: 200 mg via ORAL

## 2017-09-18 MED ORDER — ACETAMINOPHEN 500 MG PO TABS
1000.0000 mg | ORAL_TABLET | Freq: Four times a day (QID) | ORAL | Status: DC | PRN
Start: 2017-09-18 — End: 2017-09-20
  Administered 2017-09-19 – 2017-09-20 (×3): 1000 mg via ORAL
  Filled 2017-09-18 (×4): qty 2

## 2017-09-18 MED ORDER — CHLORHEXIDINE GLUCONATE CLOTH 2 % EX PADS: 6.0000 | MEDICATED_PAD | Freq: Once | CUTANEOUS | Status: DC

## 2017-09-18 MED ORDER — PHENOL 1.4 % MT LIQD: 1.0000 | OROMUCOSAL | Status: DC | PRN

## 2017-09-18 MED ORDER — ONDANSETRON HCL 4 MG/2ML IJ SOLN
INTRAMUSCULAR | Status: DC | PRN
  Administered 2017-09-18: 4 mg via INTRAVENOUS

## 2017-09-18 MED ORDER — ONDANSETRON HCL 4 MG/2ML IJ SOLN
INTRAMUSCULAR | Status: AC
  Filled 2017-09-18: qty 2

## 2017-09-18 MED ORDER — 0.9 % SODIUM CHLORIDE (POUR BTL) OPTIME
TOPICAL | Status: DC | PRN
  Administered 2017-09-18: 1000 mL

## 2017-09-18 MED ORDER — ACETAMINOPHEN 500 MG PO TABS
ORAL_TABLET | ORAL | Status: AC
  Filled 2017-09-18: qty 2

## 2017-09-18 MED ORDER — MONTELUKAST SODIUM 10 MG PO TABS
10.0000 mg | ORAL_TABLET | Freq: Every day | ORAL | Status: DC
  Administered 2017-09-18: 10 mg via ORAL
  Filled 2017-09-18: qty 1

## 2017-09-18 MED ORDER — CEFAZOLIN SODIUM-DEXTROSE 2-4 GM/100ML-% IV SOLN
INTRAVENOUS | Status: AC
  Filled 2017-09-18: qty 100

## 2017-09-18 MED ORDER — LIDOCAINE 2% (20 MG/ML) 5 ML SYRINGE
INTRAMUSCULAR | Status: AC
  Filled 2017-09-18: qty 5

## 2017-09-18 MED ORDER — DENOSUMAB 60 MG/ML ~~LOC~~ SOSY: 60.0000 mg | PREFILLED_SYRINGE | SUBCUTANEOUS | Status: DC

## 2017-09-18 MED ORDER — LORATADINE 10 MG PO TABS
10.0000 mg | ORAL_TABLET | Freq: Every day | ORAL | Status: DC
  Administered 2017-09-19 – 2017-09-20 (×2): 10 mg via ORAL
  Filled 2017-09-18 (×2): qty 1

## 2017-09-18 MED ORDER — PHENYLEPHRINE 40 MCG/ML (10ML) SYRINGE FOR IV PUSH (FOR BLOOD PRESSURE SUPPORT)
PREFILLED_SYRINGE | INTRAVENOUS | Status: DC | PRN
  Administered 2017-09-18 (×4): 80 ug via INTRAVENOUS

## 2017-09-18 MED ORDER — ENOXAPARIN SODIUM 30 MG/0.3ML ~~LOC~~ SOLN
30.0000 mg | SUBCUTANEOUS | Status: DC
  Administered 2017-09-19 – 2017-09-20 (×2): 30 mg via SUBCUTANEOUS
  Filled 2017-09-18 (×2): qty 0.3

## 2017-09-18 MED ORDER — MOMETASONE FURO-FORMOTEROL FUM 200-5 MCG/ACT IN AERO
2.0000 | INHALATION_SPRAY | Freq: Two times a day (BID) | RESPIRATORY_TRACT | Status: DC
  Administered 2017-09-18 – 2017-09-19 (×2): 2 via RESPIRATORY_TRACT
  Filled 2017-09-18: qty 8.8

## 2017-09-18 MED ORDER — GABAPENTIN 300 MG PO CAPS
300.0000 mg | ORAL_CAPSULE | ORAL | Status: AC
  Administered 2017-09-18: 300 mg via ORAL

## 2017-09-18 MED ORDER — GABAPENTIN 300 MG PO CAPS
ORAL_CAPSULE | ORAL | Status: AC
  Administered 2017-09-18: 300 mg via ORAL
  Filled 2017-09-18: qty 1

## 2017-09-18 MED ORDER — FLUTICASONE PROPIONATE 50 MCG/ACT NA SUSP
1.0000 | Freq: Every day | NASAL | Status: DC | PRN
  Filled 2017-09-18: qty 16

## 2017-09-18 MED ORDER — PROMETHAZINE HCL 25 MG/ML IJ SOLN: 6.2500 mg | INTRAMUSCULAR | Status: DC | PRN

## 2017-09-18 MED ORDER — CEFAZOLIN SODIUM-DEXTROSE 2-4 GM/100ML-% IV SOLN
2.0000 g | INTRAVENOUS | Status: AC
  Administered 2017-09-18: 2 g via INTRAVENOUS

## 2017-09-18 MED ORDER — ACETAMINOPHEN 500 MG PO TABS: 1000.0000 mg | ORAL_TABLET | ORAL | Status: DC

## 2017-09-18 MED ORDER — ALBUTEROL SULFATE HFA 108 (90 BASE) MCG/ACT IN AERS
2.0000 | INHALATION_SPRAY | Freq: Two times a day (BID) | RESPIRATORY_TRACT | Status: DC | PRN
  Filled 2017-09-18: qty 6.7

## 2017-09-18 MED ORDER — TIOTROPIUM BROMIDE MONOHYDRATE 18 MCG IN CAPS
1.0000 | ORAL_CAPSULE | Freq: Every day | RESPIRATORY_TRACT | Status: DC
  Administered 2017-09-19: 18 ug via RESPIRATORY_TRACT
  Filled 2017-09-18: qty 5

## 2017-09-18 MED ORDER — SODIUM CHLORIDE 0.9 % IV SOLN
INTRAVENOUS | Status: DC
Start: 2017-09-18 — End: 2017-09-20
  Administered 2017-09-18 – 2017-09-19 (×2): via INTRAVENOUS

## 2017-09-18 MED ORDER — OXYCODONE HCL 5 MG PO TABS: 5.0000 mg | ORAL_TABLET | Freq: Once | ORAL | Status: DC | PRN

## 2017-09-18 MED ORDER — OXYCODONE HCL 5 MG/5ML PO SOLN: 5.0000 mg | Freq: Once | ORAL | Status: DC | PRN

## 2017-09-18 MED ORDER — ROPIVACAINE HCL 5 MG/ML IJ SOLN
INTRAMUSCULAR | Status: DC | PRN
  Administered 2017-09-18: 30 mL via PERINEURAL

## 2017-09-18 MED ORDER — FENTANYL CITRATE (PF) 250 MCG/5ML IJ SOLN
INTRAMUSCULAR | Status: AC
  Filled 2017-09-18: qty 5

## 2017-09-18 MED ORDER — BUPIVACAINE-EPINEPHRINE (PF) 0.25% -1:200000 IJ SOLN
INTRAMUSCULAR | Status: AC
  Filled 2017-09-18: qty 30

## 2017-09-18 MED ORDER — CELECOXIB 200 MG PO CAPS
ORAL_CAPSULE | ORAL | Status: AC
  Administered 2017-09-18: 200 mg via ORAL
  Filled 2017-09-18: qty 1

## 2017-09-18 MED ORDER — ALBUTEROL SULFATE (2.5 MG/3ML) 0.083% IN NEBU
2.5000 mg | INHALATION_SOLUTION | Freq: Once | RESPIRATORY_TRACT | Status: AC
  Administered 2017-09-18: 2.5 mg via RESPIRATORY_TRACT

## 2017-09-18 MED ORDER — ONDANSETRON 4 MG PO TBDP
4.0000 mg | ORAL_TABLET | Freq: Four times a day (QID) | ORAL | Status: DC | PRN
  Filled 2017-09-18: qty 1

## 2017-09-18 MED ORDER — ALBUTEROL SULFATE (2.5 MG/3ML) 0.083% IN NEBU: 2.5000 mg | INHALATION_SOLUTION | Freq: Four times a day (QID) | RESPIRATORY_TRACT | Status: DC | PRN

## 2017-09-18 MED ORDER — ONDANSETRON HCL 4 MG/2ML IJ SOLN
4.0000 mg | Freq: Four times a day (QID) | INTRAMUSCULAR | Status: DC | PRN
  Administered 2017-09-19 – 2017-09-20 (×2): 4 mg via INTRAVENOUS
  Filled 2017-09-18 (×2): qty 2

## 2017-09-18 MED ORDER — HYDROMORPHONE HCL 1 MG/ML IJ SOLN: 0.2500 mg | INTRAMUSCULAR | Status: DC | PRN

## 2017-09-18 MED ORDER — GABAPENTIN 300 MG PO CAPS
300.0000 mg | ORAL_CAPSULE | Freq: Three times a day (TID) | ORAL | Status: DC
  Administered 2017-09-18 – 2017-09-19 (×2): 300 mg via ORAL
  Filled 2017-09-18 (×2): qty 1

## 2017-09-18 SURGICAL SUPPLY — 44 items
BINDER BREAST LRG (GAUZE/BANDAGES/DRESSINGS) IMPLANT
BINDER BREAST XLRG (GAUZE/BANDAGES/DRESSINGS) ×3 IMPLANT
BLADE SURG 15 STRL LF DISP TIS (BLADE) ×1 IMPLANT
BLADE SURG 15 STRL SS (BLADE) ×2
CANISTER SUCT 3000ML PPV (MISCELLANEOUS) ×3 IMPLANT
CHLORAPREP W/TINT 26ML (MISCELLANEOUS) ×3 IMPLANT
CLIP VESOCCLUDE SM WIDE 6/CT (CLIP) ×3 IMPLANT
COVER PROBE W GEL 5X96 (DRAPES) ×3 IMPLANT
COVER SURGICAL LIGHT HANDLE (MISCELLANEOUS) ×3 IMPLANT
DERMABOND ADVANCED (GAUZE/BANDAGES/DRESSINGS) ×2
DERMABOND ADVANCED .7 DNX12 (GAUZE/BANDAGES/DRESSINGS) ×1 IMPLANT
DEVICE DUBIN SPECIMEN MAMMOGRA (MISCELLANEOUS) ×3 IMPLANT
DRAPE CHEST BREAST 15X10 FENES (DRAPES) ×3 IMPLANT
DRAPE UTILITY XL STRL (DRAPES) ×3 IMPLANT
DRSG PAD ABDOMINAL 8X10 ST (GAUZE/BANDAGES/DRESSINGS) ×3 IMPLANT
ELECT COATED BLADE 2.86 ST (ELECTRODE) ×3 IMPLANT
ELECT REM PT RETURN 9FT ADLT (ELECTROSURGICAL) ×3
ELECTRODE REM PT RTRN 9FT ADLT (ELECTROSURGICAL) ×1 IMPLANT
GAUZE SPONGE 4X4 12PLY STRL (GAUZE/BANDAGES/DRESSINGS) ×3 IMPLANT
GLOVE BIOGEL PI IND STRL 8 (GLOVE) ×1 IMPLANT
GLOVE BIOGEL PI INDICATOR 8 (GLOVE) ×2
GLOVE ECLIPSE 7.5 STRL STRAW (GLOVE) ×3 IMPLANT
GOWN STRL REUS W/ TWL LRG LVL3 (GOWN DISPOSABLE) ×1 IMPLANT
GOWN STRL REUS W/ TWL XL LVL3 (GOWN DISPOSABLE) ×1 IMPLANT
GOWN STRL REUS W/TWL LRG LVL3 (GOWN DISPOSABLE) ×2
GOWN STRL REUS W/TWL XL LVL3 (GOWN DISPOSABLE) ×2
KIT BASIN OR (CUSTOM PROCEDURE TRAY) ×3 IMPLANT
KIT MARKER MARGIN INK (KITS) ×3 IMPLANT
NEEDLE HYPO 25GX1X1/2 BEV (NEEDLE) ×3 IMPLANT
NS IRRIG 1000ML POUR BTL (IV SOLUTION) ×3 IMPLANT
PACK SURGICAL SETUP 50X90 (CUSTOM PROCEDURE TRAY) ×3 IMPLANT
PENCIL BUTTON HOLSTER BLD 10FT (ELECTRODE) ×3 IMPLANT
SPONGE LAP 18X18 X RAY DECT (DISPOSABLE) ×3 IMPLANT
SUT ETHILON 4 0 PS 2 18 (SUTURE) ×3 IMPLANT
SUT MON AB 5-0 PS2 18 (SUTURE) ×3 IMPLANT
SUT VIC AB 3-0 SH 18 (SUTURE) ×3 IMPLANT
SUT VIC AB 3-0 SH 8-18 (SUTURE) ×3 IMPLANT
SYR BULB 3OZ (MISCELLANEOUS) ×3 IMPLANT
SYR CONTROL 10ML LL (SYRINGE) ×3 IMPLANT
TOWEL OR 17X24 6PK STRL BLUE (TOWEL DISPOSABLE) ×3 IMPLANT
TOWEL OR 17X26 10 PK STRL BLUE (TOWEL DISPOSABLE) ×3 IMPLANT
TUBE CONNECTING 12'X1/4 (SUCTIONS) ×1
TUBE CONNECTING 12X1/4 (SUCTIONS) ×2 IMPLANT
YANKAUER SUCT BULB TIP NO VENT (SUCTIONS) ×3 IMPLANT

## 2017-09-18 NOTE — Op Note (Signed)
Preoperative Diagnosis: CANCER UPPER OUTER RIGHT BREAST  Postoprative Diagnosis: CANCER UPPER OUTER RIGHT BREAST  Procedure: Procedure(s): RIGHT BREAST PARTIAL MASTECTOMY WITH RADIOACTIVE SEED LOCALIZATION   Surgeon: Excell Seltzer T   Assistants: None  Anesthesia:  General LMA anesthesia  Indications:  Impression: 72 year old female with a new diagnosis of cancer of the right breast, upper outer quadrant. 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 history of right breast lumpecttmy in the same area with apparent axillary dissection, radiation therapy and chemotherapy late 1980s.  Patient has severe oxygen dependent COPD.  Her tumor is located in the extreme axillary tail of the breast.  As mastectomy would really not give [place] any significant additional margins.  Due to the location of the tumor and because of her severe COPD after extensive discussion with the patient and after multidisciplinary evaluation we have elected to proceed with right breast lumpectomy as surgical therapy.   Procedure Detail: Patient had previously undergone accurate placement of a radioactive seed at the tumor site in the axillary tail of the right breast.  She was taken to the operating room, placed in the supine position on the operating table, and laryngeal mask anesthesia induced.  The breast and axilla were widely sterilely prepped and draped.  She received preoperative IV antibiotics.  Patient timeout was performed and correct procedure verified.  The neoprobe was used to localize the seed and tumor which were superficial just beneath the previous obliquely oriented scar in the right axilla and axillary tail of the breast.  I used an elliptical incision oriented obliquely along the previous scar excising the previous scar and about a 2 cm wide strip of skin and scar overlying the superficial tumor.  Dissection was carried down to the subcutaneous tissue with cautery.  The  dissection was then broadened laterally and deepened down to the chest wall.  The neoprobe indicated the seed centrally located in the specimen.  The specimen was taken completely down to the chest wall and excised with cautery.  The specimen was inked for margins.  Specimen x-ray showed the tumor marking clips in the seed centrally located.  This was sent for permanent pathology.  The surrounding tissue appeared normal.  Soft tissue was infiltrated with Marcaine.  Complete hemostasis was obtained.  I mobilized breast tissue off the chest wall superiorly and inferiorly for closure.  The deep breast and subtenons tissue was closed with interrupted 3-0 Vicryl and the skin was closed with a running mattress suture of 4-0 nylon.  Sponge needle and instrument counts were correct.    Findings: As above  Estimated Blood Loss:  Minimal         Drains: None  Blood Given: none          Specimens: Right breast lumpectomy        Complications:  * No complications entered in OR log *         Disposition: PACU - hemodynamically stable.         Condition: stable

## 2017-09-18 NOTE — Anesthesia Procedure Notes (Signed)
Anesthesia Regional Block: Pectoralis block   Pre-Anesthetic Checklist: ,, timeout performed, Correct Patient, Correct Site, Correct Laterality, Correct Procedure, Correct Position, site marked, Risks and benefits discussed,  Surgical consent,  Pre-op evaluation,  At surgeon's request and post-op pain management  Laterality: Right  Prep: chloraprep       Needles:  Injection technique: Single-shot  Needle Type: Stimiplex     Needle Length: 9cm  Needle Gauge: 21     Additional Needles:   Narrative:  Start time: 09/18/2017 7:17 AM End time: 09/18/2017 7:22 AM Injection made incrementally with aspirations every 5 mL.  Performed by: Personally  Anesthesiologist: Lynda Rainwater, MD

## 2017-09-18 NOTE — Interval H&P Note (Signed)
History and Physical Interval Note:  09/18/2017 7:20 AM  Ashley Conrad  has presented today for surgery, with the diagnosis of CANCER UPPER OUTER RIGHT BREAST  The various methods of treatment have been discussed with the patient and family. After consideration of risks, benefits and other options for treatment, the patient has consented to  Procedure(s): RIGHT BREAST PARTIAL MASTECTOMY WITH RADIOACTIVE SEED LOCALIZATION (Right) as a surgical intervention .  The patient's history has been reviewed, patient examined, no change in status, stable for surgery.  I have reviewed the patient's chart and labs.  Questions were answered to the patient's satisfaction.     Darene Lamer Lakai Moree

## 2017-09-18 NOTE — Anesthesia Preprocedure Evaluation (Signed)
Anesthesia Evaluation  Patient identified by MRN, date of birth, ID band Patient awake    Reviewed: Allergy & Precautions, NPO status , Patient's Chart, lab work & pertinent test results  Airway Mallampati: II  TM Distance: >3 FB Neck ROM: Full    Dental no notable dental hx.    Pulmonary neg pulmonary ROS, shortness of breath, COPD,  oxygen dependent, former smoker,    Pulmonary exam normal breath sounds clear to auscultation       Cardiovascular hypertension, Pt. on medications negative cardio ROS Normal cardiovascular exam Rhythm:Regular Rate:Normal     Neuro/Psych  Headaches, negative neurological ROS  negative psych ROS   GI/Hepatic negative GI ROS, Neg liver ROS, GERD  ,  Endo/Other  negative endocrine ROS  Renal/GU negative Renal ROS  negative genitourinary   Musculoskeletal negative musculoskeletal ROS (+)   Abdominal   Peds negative pediatric ROS (+)  Hematology negative hematology ROS (+)   Anesthesia Other Findings   Reproductive/Obstetrics negative OB ROS                             Anesthesia Physical Anesthesia Plan  ASA: IV  Anesthesia Plan: General   Post-op Pain Management:    Induction: Intravenous  PONV Risk Score and Plan: 3 and Treatment may vary due to age or medical condition, Ondansetron, Dexamethasone and Midazolam  Airway Management Planned: LMA  Additional Equipment:   Intra-op Plan:   Post-operative Plan: Extubation in OR and Possible Post-op intubation/ventilation  Informed Consent: I have reviewed the patients History and Physical, chart, labs and discussed the procedure including the risks, benefits and alternatives for the proposed anesthesia with the patient or authorized representative who has indicated his/her understanding and acceptance.   Dental advisory given  Plan Discussed with: CRNA  Anesthesia Plan Comments:          Anesthesia Quick Evaluation

## 2017-09-18 NOTE — Anesthesia Procedure Notes (Signed)
Procedure Name: LMA Insertion Date/Time: 09/18/2017 7:33 AM Performed by: Lieutenant Diego, CRNA Pre-anesthesia Checklist: Patient identified, Emergency Drugs available, Suction available and Patient being monitored Patient Re-evaluated:Patient Re-evaluated prior to induction Oxygen Delivery Method: Circle system utilized Preoxygenation: Pre-oxygenation with 100% oxygen Induction Type: IV induction Ventilation: Mask ventilation without difficulty LMA: LMA inserted LMA Size: 4.0 Number of attempts: 1 Placement Confirmation: positive ETCO2 and breath sounds checked- equal and bilateral Tube secured with: Tape Dental Injury: Teeth and Oropharynx as per pre-operative assessment

## 2017-09-18 NOTE — Progress Notes (Signed)
Patient arrived from PACU. Surgical area is clean dry and intact. Patient has been placed on telemetry, CCMD has been notified. Patient denies any pain. Vital signs are stable, patient is alert/oriented and resting comfortably in bed with family by bedside.

## 2017-09-18 NOTE — Progress Notes (Signed)
ABG results given to RN. Anesthesiology called and no new orders.Paged Dr Excell Seltzer no call back at this time.

## 2017-09-18 NOTE — Anesthesia Postprocedure Evaluation (Signed)
Anesthesia Post Note  Patient: Ashley Conrad  Procedure(s) Performed: RIGHT BREAST PARTIAL MASTECTOMY WITH RADIOACTIVE SEED LOCALIZATION (Right Breast)     Patient location during evaluation: PACU Anesthesia Type: General Level of consciousness: awake and alert Pain management: pain level controlled Vital Signs Assessment: post-procedure vital signs reviewed and stable Respiratory status: spontaneous breathing, nonlabored ventilation, respiratory function stable and non-rebreather facemask Cardiovascular status: blood pressure returned to baseline and stable Postop Assessment: no apparent nausea or vomiting Anesthetic complications: no    Last Vitals:  Vitals:   09/18/17 0854 09/18/17 0900  BP: 130/77   Pulse: 86   Resp: 16   Temp:  36.6 C  SpO2: 91%     Last Pain:  Vitals:   09/18/17 0900  TempSrc:   PainSc: 0-No pain                 Lynda Rainwater

## 2017-09-18 NOTE — Transfer of Care (Signed)
Immediate Anesthesia Transfer of Care Note  Patient: Ashley Conrad  Procedure(s) Performed: RIGHT BREAST PARTIAL MASTECTOMY WITH RADIOACTIVE SEED LOCALIZATION (Right Breast)  Patient Location: PACU  Anesthesia Type:General  Level of Consciousness: awake and alert   Airway & Oxygen Therapy: Patient Spontanous Breathing and Patient connected to face mask oxygen  Post-op Assessment: Report given to RN and Post -op Vital signs reviewed and stable  Post vital signs: Reviewed and stable  Last Vitals:  Vitals Value Taken Time  BP 119/79 09/18/2017  8:24 AM  Temp    Pulse 97 09/18/2017  8:27 AM  Resp 17 09/18/2017  8:27 AM  SpO2 86 % 09/18/2017  8:27 AM  Vitals shown include unvalidated device data.  Last Pain:  Vitals:   09/18/17 0617  TempSrc: Oral  PainSc:          Complications: No apparent anesthesia complications

## 2017-09-18 NOTE — H&P (Signed)
History of Present Illness The patient is a 72 year old female who presents with breast cancer.. She is a post menopausal female referred by Dr. Marcelo Baldy for evaluation of recently diagnosed carcinoma of the right breast. she has a significant history of right breast cancer treated in the late 1980s in [place] with lumpectomy and axillary dissection, radiation and chemotherapy. She recently presented for a screening mamogram revealing new focal asymmetry in the upper outer right breast. Subsequent imaging included diagnostic mamogram showing adjacent 11 and 8 mm masses upper outer right breast posterior depth 11 cm from the nipple and ultrasound showing to adjacent masses 11:00 position posterior depth, 1.5 cm and is 0.8 cm.. 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 She is seen now in breast multidisciplinary clinic for initial treatment planning. She has experienced no symptoms, specifically lump or pain or skin changes or nipple discharge. She does have a personal history of right breast cancer as above.  Findings at that time were the following: Tumor size: adjacent 1.5 and 0.8 cm Tumor grade: 2-3, Ki-67 10% Estrogen Receptor: positive Progesterone Receptor: positive Her-2 neu: negative Lymph node status: negative    Past Surgical History  Appendectomy  Breast Biopsy  Right. multiple Breast Mass; Local Excision  Right. Cataract Surgery  Bilateral. Oral Surgery  Tonsillectomy   Diagnostic Studies History  Colonoscopy  1-5 years ago Pap Smear  >5 years ago  Allergies  Flexeril *MUSCULOSKELETAL THERAPY AGENTS*   Medication History  Medications Reconciled Atorvastatin Calcium (10MG Tablet, Oral daily) Active. Cetirizine HCl (10MG Tablet, Oral daily) Active. Denosumab (60MG/ML Solution, 63m Subcutaneous every 6 months) Active. DilTIAZem HCl ER (240MG Capsule ER  24HR, Oral daily) Active. DULoxetine HCl (30MG Capsule DR Part, Oral daily) Active. EQL Fluticasone Propionate (50MCG/ACT Suspension, 2 sprays Nasal daily) Active. Montelukast Sodium (10MG Tablet, Oral daily) Active. Omeprazole (20MG Capsule DR, Oral daily) Active. Spiriva HandiHaler (18MCG Capsule, Inhalation daily) Active. Symbicort (160-4.5MCG/ACT Aerosol, 2 inhalations Inhalation two times daily) Active. Ventolin HFA (108 (90 Base)MCG/ACT Aerosol Soln, 2 inhalation Inhalation two times daily) Active.  Social History Alcohol use  Moderate alcohol use. Caffeine use  Carbonated beverages, Coffee, Tea. No drug use  Tobacco use  Former smoker.  Family History  Breast Cancer  Mother. Hypertension  Family Members In General, Father, Mother. Migraine Headache  Father, Sister. Prostate Cancer  Brother, Father. Respiratory Condition  Father. Thyroid problems  Sister.  Pregnancy / Birth History Age at menarche  89years. Age of menopause  <43 Contraceptive History  Oral contraceptives. Gravida  3 Irregular periods  Maternal age  74-30Para  2  Other Problems  Breast Cancer  Chronic Obstructive Lung Disease  Diverticulosis  Gastric Ulcer  Gastroesophageal Reflux Disease  Heart murmur  High blood pressure  Home Oxygen Use  Lump In Breast     Review of Systems General Present- Fatigue and Weight Gain. Not Present- Appetite Loss, Chills, Fever, Night Sweats and Weight Loss. Skin Present- Change in Wart/Mole and New Lesions. Not Present- Dryness, Hives, Jaundice, Non-Healing Wounds, Rash and Ulcer. HEENT Present- Seasonal Allergies, Sinus Pain and Wears glasses/contact lenses. Not Present- Earache, Hearing Loss, Hoarseness, Nose Bleed, Oral Ulcers, Ringing in the Ears, Sore Throat, Visual Disturbances and Yellow Eyes. Respiratory Present- Difficulty Breathing. Not Present- Bloody sputum, Chronic Cough, Snoring and Wheezing. Breast Not Present-  Breast Mass, Breast Pain, Nipple Discharge and Skin Changes. Cardiovascular Present- Shortness of Breath and  Swelling of Extremities. Not Present- Chest Pain, Difficulty Breathing Lying Down, Leg Cramps, Palpitations and Rapid Heart Rate. Female Genitourinary Not Present- Frequency, Nocturia, Painful Urination, Pelvic Pain and Urgency. Musculoskeletal Not Present- Back Pain, Joint Pain, Joint Stiffness, Muscle Pain, Muscle Weakness and Swelling of Extremities. Neurological Present- Trouble walking. Not Present- Decreased Memory, Fainting, Headaches, Numbness, Seizures, Tingling, Tremor and Weakness. Psychiatric Not Present- Anxiety, Bipolar, Change in Sleep Pattern, Depression, Fearful and Frequent crying.   Physical Exam  The physical exam findings are as follows: Note:General: Alert, moderately obese African-American female on oxygen, in no distress Skin: Warm and dry without rash or infection. HEENT: No palpable masses or thyromegaly. Sclera nonicteric. Pupils equal round and reactive. . Lymph nodes: No cervical, supraclavicular nodes palpable. Breasts: Large breasts bilaterally. There is a single scar over the axillary tail of the right breast extending into the axilla. Some thickening or possible mass underneath the medial end of the scar over the breast. No palpable axillary masses. No other palpable masses in either breast. Lungs: Breath sounds distant. No wheezing or increased work of breathing. Cardiovascular: Regular rate and rhythm without murmer. No JVD or edema. Abdomen:obese. Nondistended. Soft and nontender. No masses palpable. No organomegaly. No palpable hernias. Extremities: No edema or joint swelling or deformity. No chronic venous stasis changes. Neurologic: Alert and fully oriented. Gait normal. No focal weakness. Psychiatric: Normal mood and affect. Thought content appropriate with normal judgement and insight    Assessment & Plan  MALIGNANT NEOPLASM OF UPPER-OUTER  QUADRANT OF RIGHT BREAST IN FEMALE, ESTROGEN RECEPTOR POSITIVE (C50.411) Impression: 72 year old female with a new diagnosis of cancer of the right breast, upper outer quadrant. 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 history of right breast lumpecttmy in the same area with apparent axillary dissection, radiation therapy and chemotherapy late 1980s.I discussed with the patient and family members present today initial surgical treatment options. we discussed that standard therapy in this situation would be total mastectomy. She however has significant comorbidities with oxygen-dependent COPD. She has large breasts which would require much more surgery than local excision and with the tumor being very peripheral at the lateral breast we would really not obtain significantly additional margins. I think a reasonable option would be a partial mastectomy to obtain as widely negative margins as possible and follow up with hormonal therapy based on her overall condition. After discussion of pros and cons she is in agreement with this plan. We will need to obtain pulmonary clearance preoperatively. I discussed the nature of the procedure including risks of anesthetic coming, infection, cardiorespiratory complications, bleeding, infection and possible need for further surgery up to and including mastectomy based on margins. She understands and agrees to proceed. Current Plans Radioactive seed localized right partial mastectomy under general anesthesia as an outpatient pending pulmonary clearance from [facility]

## 2017-09-19 ENCOUNTER — Encounter (HOSPITAL_COMMUNITY): Payer: Self-pay | Admitting: General Surgery

## 2017-09-19 ENCOUNTER — Observation Stay (HOSPITAL_COMMUNITY): Payer: Medicare Other

## 2017-09-19 DIAGNOSIS — C50411 Malignant neoplasm of upper-outer quadrant of right female breast: Secondary | ICD-10-CM | POA: Diagnosis present

## 2017-09-19 DIAGNOSIS — J441 Chronic obstructive pulmonary disease with (acute) exacerbation: Secondary | ICD-10-CM | POA: Diagnosis not present

## 2017-09-19 DIAGNOSIS — Z9981 Dependence on supplemental oxygen: Secondary | ICD-10-CM | POA: Diagnosis not present

## 2017-09-19 DIAGNOSIS — Z87891 Personal history of nicotine dependence: Secondary | ICD-10-CM | POA: Diagnosis not present

## 2017-09-19 DIAGNOSIS — Z170 Estrogen receptor positive status [ER+]: Secondary | ICD-10-CM | POA: Diagnosis not present

## 2017-09-19 DIAGNOSIS — Z8711 Personal history of peptic ulcer disease: Secondary | ICD-10-CM | POA: Diagnosis not present

## 2017-09-19 DIAGNOSIS — R0902 Hypoxemia: Secondary | ICD-10-CM | POA: Diagnosis not present

## 2017-09-19 DIAGNOSIS — J9622 Acute and chronic respiratory failure with hypercapnia: Secondary | ICD-10-CM

## 2017-09-19 DIAGNOSIS — K219 Gastro-esophageal reflux disease without esophagitis: Secondary | ICD-10-CM | POA: Diagnosis present

## 2017-09-19 DIAGNOSIS — Z8042 Family history of malignant neoplasm of prostate: Secondary | ICD-10-CM | POA: Diagnosis not present

## 2017-09-19 DIAGNOSIS — E872 Acidosis: Secondary | ICD-10-CM | POA: Diagnosis not present

## 2017-09-19 DIAGNOSIS — Z9119 Patient's noncompliance with other medical treatment and regimen: Secondary | ICD-10-CM | POA: Diagnosis not present

## 2017-09-19 DIAGNOSIS — K5790 Diverticulosis of intestine, part unspecified, without perforation or abscess without bleeding: Secondary | ICD-10-CM | POA: Diagnosis present

## 2017-09-19 DIAGNOSIS — C50919 Malignant neoplasm of unspecified site of unspecified female breast: Secondary | ICD-10-CM | POA: Diagnosis present

## 2017-09-19 DIAGNOSIS — J449 Chronic obstructive pulmonary disease, unspecified: Secondary | ICD-10-CM | POA: Diagnosis present

## 2017-09-19 DIAGNOSIS — Z803 Family history of malignant neoplasm of breast: Secondary | ICD-10-CM | POA: Diagnosis not present

## 2017-09-19 DIAGNOSIS — Z9221 Personal history of antineoplastic chemotherapy: Secondary | ICD-10-CM | POA: Diagnosis not present

## 2017-09-19 DIAGNOSIS — J95821 Acute postprocedural respiratory failure: Secondary | ICD-10-CM | POA: Diagnosis not present

## 2017-09-19 DIAGNOSIS — Z8249 Family history of ischemic heart disease and other diseases of the circulatory system: Secondary | ICD-10-CM | POA: Diagnosis not present

## 2017-09-19 DIAGNOSIS — Z923 Personal history of irradiation: Secondary | ICD-10-CM | POA: Diagnosis not present

## 2017-09-19 DIAGNOSIS — I10 Essential (primary) hypertension: Secondary | ICD-10-CM | POA: Diagnosis present

## 2017-09-19 LAB — BLOOD GAS, ARTERIAL
Acid-Base Excess: 3.2 mmol/L — ABNORMAL HIGH (ref 0.0–2.0)
Acid-Base Excess: 2.6 mmol/L — ABNORMAL HIGH (ref 0.0–2.0)
BICARBONATE: 30.3 mmol/L — AB (ref 20.0–28.0)
Bicarbonate: 29 mmol/L — ABNORMAL HIGH (ref 20.0–28.0)
DRAWN BY: 331761
Delivery systems: POSITIVE
Drawn by: 406621
Expiratory PAP: 8
FIO2: 60
INSPIRATORY PAP: 16
LHR: 18 {breaths}/min
O2 Content: 5 L/min
O2 Saturation: 83.2 %
O2 Saturation: 92.7 %
PH ART: 7.224 — AB (ref 7.350–7.450)
PO2 ART: 53.7 mmHg — AB (ref 83.0–108.0)
Patient temperature: 98.6
Patient temperature: 98.6
pCO2 arterial: 76 mmHg (ref 32.0–48.0)
pCO2 arterial: 66.3 mmHg (ref 32.0–48.0)
pH, Arterial: 7.264 — ABNORMAL LOW (ref 7.350–7.450)
pO2, Arterial: 70.3 mmHg — ABNORMAL LOW (ref 83.0–108.0)

## 2017-09-19 MED ORDER — PROMETHAZINE HCL 25 MG/ML IJ SOLN
12.5000 mg | Freq: Once | INTRAMUSCULAR | Status: AC
  Administered 2017-09-19: 12.5 mg via INTRAVENOUS
  Filled 2017-09-19: qty 1

## 2017-09-19 MED ORDER — BUDESONIDE 0.5 MG/2ML IN SUSP
0.5000 mg | Freq: Two times a day (BID) | RESPIRATORY_TRACT | Status: DC
  Administered 2017-09-19 – 2017-09-20 (×3): 0.5 mg via RESPIRATORY_TRACT
  Filled 2017-09-19 (×3): qty 2

## 2017-09-19 MED ORDER — IPRATROPIUM-ALBUTEROL 0.5-2.5 (3) MG/3ML IN SOLN
3.0000 mL | Freq: Four times a day (QID) | RESPIRATORY_TRACT | Status: DC
  Administered 2017-09-19 – 2017-09-20 (×3): 3 mL via RESPIRATORY_TRACT
  Filled 2017-09-19 (×4): qty 3

## 2017-09-19 NOTE — Progress Notes (Signed)
CRITICAL VALUE ALERT  Critical Value:  ABG results  Date & Time Notied:  0900, 09/19/17  Provider Notified: Hoxworth MD  Orders Received/Actions taken: Awaiting orders.

## 2017-09-19 NOTE — Progress Notes (Signed)
Pt placed back on BIPAP at this time. RN aware. Will continue to monitor?

## 2017-09-19 NOTE — Progress Notes (Signed)
Pt lethargic this AM. A/ox3. Pt up to [facility] and unable to void. Pt placed in chair. Morning medication given. Oxygen saturation 88 on 7L [place]. HR 120's sustaining. BP 123/90 (100). Physician aware. ABG taken. Will continue to monitor.  Clyde Canterbury, RN

## 2017-09-19 NOTE — Consult Note (Addendum)
Name: Ashley Conrad MRN: [id number] DOB: 09/14/1945    ADMISSION DATE:  09/18/2017 CONSULTATION DATE:  7/24  REFERRING MD :  hoxworth ([facility])   CHIEF COMPLAINT:  Lethargy, hypercarbia   BRIEF PATIENT DESCRIPTION: 72 year old female with history of HTN, severe COPD on 4 L home O2, followed by pulmonary at [facility] who was admitted 7/23 for elective right breast lumpectomy.  OR case and anesthesia were unremarkable.  However, on the morning of 7/24 she was noted to be significantly lethargic.  ABG revealed respiratory acidosis, hypoxia and hypercarbia with PCO2 76.  PCCM consulted.  SIGNIFICANT EVENTS    STUDIES:     HISTORY OF PRESENT ILLNESS:  72 year old female with history of HTN, severe COPD on 4 L home O2, followed by pulmonary at [facility] who was admitted 7/23 for elective right breast lumpectomy.  OR case and anesthesia were unremarkable.  However, on the morning of 7/24 she was noted to be significantly lethargic.  ABG revealed respiratory acidosis and hypercarbia with PCO2 76.  PCCM consulted.  Of note she did receive 1 dose of oxycodone at 9 PM 7/23.  Seems a bit more awake since BiPAP applied.  Has been on BiPAP approximately 45 minutes according to RN.  PAST MEDICAL HISTORY :   has a past medical history of Cancer ([place]), COPD (chronic obstructive pulmonary disease) ([place]), Dyspnea, Family history of breast cancer, Family history of prostate cancer, Family history of stomach cancer, GERD (gastroesophageal reflux disease), Headache, Heart murmur, History of right breast cancer, Hypertension, Oxygen dependent, and Personal history of breast cancer (08/15/2017).  has a past surgical history that includes Appendectomy; right lumpectomy; Tonsilectomy, adenoidectomy, bilateral myringotomy and tubes; Cataract extraction, bilateral; Mastectomy, partial (Right, 09/18/2017); and Breast lumpectomy with radioactive seed localization (Right, 09/18/2017). Prior to Admission medications   Medication  Sig Start Date End Date Taking? Authorizing Provider  acetaminophen (TYLENOL) 500 MG tablet Take 1,000 mg by mouth every 6 (six) hours as needed.   Yes [provider]  albuterol (PROVENTIL HFA;VENTOLIN HFA) 108 (90 Base) MCG/ACT inhaler Inhale 2 puffs into the lungs 2 (two) times daily as needed for wheezing or shortness of breath.   Yes [provider]  atorvastatin (LIPITOR) 10 MG tablet Take 10 mg by mouth daily.   Yes [provider]  budesonide-formoterol (SYMBICORT) 160-4.5 MCG/ACT inhaler Inhale 2 puffs into the lungs 2 (two) times daily.   Yes [provider]  cetirizine (ZYRTEC) 10 MG tablet Take 10 mg by mouth daily.   Yes [provider]  diltiazem (TIAZAC) 240 MG 24 hr capsule Take 240 mg by mouth daily.   Yes [provider]  DULoxetine (CYMBALTA) 30 MG capsule Take 30 mg by mouth daily.   Yes [provider]  Ergocalciferol (VITAMIN D2 PO) Take 50,000 Units by mouth every 30 (thirty) days.    Yes [provider]  fluticasone (FLONASE) 50 MCG/ACT nasal spray Place 1 spray into both nostrils daily as needed (for allergies.).    Yes [provider]  Magnesium Oxide 400 (240 Mg) MG TABS Take 240 mg by mouth daily.   Yes [provider]  montelukast (SINGULAIR) 10 MG tablet Take 10 mg by mouth at bedtime.   Yes [provider]  omeprazole (PRILOSEC) 20 MG capsule Take 20 mg by mouth daily before breakfast.    Yes [provider]  SPIRIVA RESPIMAT 2.5 MCG/ACT AERS Inhale 1 puff into the lungs daily. 08/13/17  Yes [provider]  denosumab (  PROLIA) 60 MG/ML SOSY injection Inject 60 mg into the skin every 6 (six) months.    [provider]  fluconazole (DIFLUCAN) 100 MG tablet Take 2 tabs today, then 1 tab daily for 4 more days. Hold Atorvastatin while on this. Patient not taking: Reported on 08/08/2017 08/08/17   Eppie Gibson, MD  gabapentin (NEURONTIN) 300 MG capsule  Take 300-600 mg by mouth 3 (three) times daily as needed. For pain. 08/29/17   [provider]   Allergies  Allergen Reactions  . Cyclobenzaprine Other (See Comments)    Cognitive issues.  . Shellfish Allergy Itching    Itching    FAMILY HISTORY:  family history includes Breast cancer (age of onset: 62) in her mother; Diabetes in her maternal grandmother; Prostate cancer (age of onset: 31) in her father; Prostate cancer (age of onset: 27) in her brother; Stomach cancer in her paternal aunt and paternal aunt. SOCIAL HISTORY:  reports that she quit smoking about 5 years ago. She has never used smokeless tobacco. She reports that she drank alcohol. She reports that she does not use drugs.  REVIEW OF SYSTEMS:   Unable, pt lethargic.  As per HPI obtained from records and staff.   SUBJECTIVE:   VITAL SIGNS: Temp:  [98.4 F (36.9 C)-99.6 F (37.6 C)] 98.5 F (36.9 C) (07/24 0807) Pulse Rate:  [81-128] 128 (07/24 0807) Resp:  [14-18] 18 (07/24 0807) BP: (105-141)/(76-98) 123/90 (07/24 0807) SpO2:  [90 %-99 %] 90 % (07/24 0807)  PHYSICAL EXAMINATION: General: Chronically ill-appearing female, no acute distress Neuro: Lethargic but easily wakes, nods, answers questions appropriately but does fall quickly back to sleep HEENT: MM moist, BiPAP Cardiovascular: S1-S2, RRR Lungs: Secretions are even and nonlabored on BiPAP, diminished throughout, no audible wheeze Abdomen: Round, soft Musculoskeletal: Warm and dry, no significant edema, right chest/breast dressing clean and dry  Recent Labs  Lab 09/13/17 1332 09/18/17 0652  NA 141 141  K 3.7 3.7  CL 105  --   CO2 27  --   BUN 8  --   CREATININE 0.70  --   GLUCOSE 124*  --    Recent Labs  Lab 09/13/17 1332 09/18/17 0652  HGB 13.5 13.6  HCT 46.2* 40.0  WBC 9.2  --   PLT 235  --    No results found.  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 Severe COPD-4 L O2 dependent at home.  Easily desaturates with any activity per husband.  Acute encephalopathy-related to hypercarbia  Plan- Continue BiPAP for now Follow-up ABG at noon Can hold off on Narcan given that she is improving with BiPAP DC sedating medications Supplemental O2 as needed to keep O2 sats 88-92% Follow-up chest x-ray now No indication for systemic steroids at this time Hold home Dulera, Spiriva while on BiPAP Scheduled duo nebs and budesonide Hygiene once more awake   S/P right breast lumpectomy Plan- Per surgery Nickolas Madrid, NP 09/19/2017  10:07 AM Pager: [phone] or [phone]  Attending Note:  72 year old female with PMH of COPD who underwent a right breast lumpectomy and now presents with acute on chronic hypercarbic respiratory failure and PCCM was called for consultation.  On exam, decreased BS diffusely, very quite chest.  I reviewed CXR myself, hyperinflation noted, new one ordered for today.  Discussed with PCCM-NP.  Hypercarbic respiratory failure: likely a combination of  narcotic, anesthesia and positioning that resulted in her loss of mechanical advantage for breathing and unsure if able to take her MDI.  - BiPAP  - F/U ABG at noon  - No intubation for now  COPD:  - Change all inhalers from MDI to nebs  - No need for steroids  Hypoxemia:  - Titrate O2 for sat of 88-92%  - Avoid hyper oxygenation  S/P lumpectomy:  - Avoid all narcotic as able (understand just had surgery however).  - If narcotics are to be used then will need BiPAP at night to avoid respiratory failure  PCCM will continue to follow.  Patient seen and examined, agree with above note.  I dictated the care and orders written for this patient under my direction.  Rush Farmer, [place]

## 2017-09-19 NOTE — Progress Notes (Addendum)
CRITICAL VALUE ALERT  Critical Value:  ABG results- CO2- 66.3  Date & Time Notied:  09/19/17 1230  Provider Notified: Nickolas Madrid, NP  Orders Received/Actions taken: Paged x2, awaiting orders.

## 2017-09-19 NOTE — Progress Notes (Signed)
Patient ID: Ashley Conrad, female   DOB: Nov 24, 1945, 72 y.o.   MRN: [id number] 1 Day Post-Op   Subjective: No specific complaints this morning but drowsy  Objective: Vital signs in last 24 hours: Temp:  [97.3 F (36.3 C)-99.6 F (37.6 C)] 98.8 F (37.1 C) (07/24 0429) Pulse Rate:  [79-115] 115 (07/24 0429) Resp:  [14-24] 18 (07/24 0429) BP: (105-141)/(76-98) 123/90 (07/24 0807) SpO2:  [86 %-99 %] 91 % (07/24 0731) Last BM Date: 09/17/17  Intake/Output from previous day: 07/23 0701 - 07/24 0700 In: 420.2 [P.O.:120; I.V.:200.2] Out: 510 [Urine:500; Blood:10] Intake/Output this shift: No intake/output data recorded.  General appearance: cooperative, no distress and Fairly drowsy Resp: No wheezing or increased work of breathing Incision/Wound: Dressing clean and dry  Lab Results:  Recent Labs    09/18/17 0652  HGB 13.6  HCT 40.0   BMET Recent Labs    09/18/17 0652  NA 141  K 3.7     Studies/Results: No results found.  Anti-infectives: Anti-infectives (From admission, onward)   Start     Dose/Rate Route Frequency Ordered Stop   09/18/17 0600  ceFAZolin (ANCEF) IVPB 2g/100 mL premix     2 g 200 mL/hr over 30 Minutes Intravenous On call to O.R. 09/18/17 1601 09/18/17 0743   09/18/17 0549  ceFAZolin (ANCEF) 2-4 GM/100ML-% IVPB    Note to Pharmacy:  Debbe Bales, [place]: cabinet override      09/18/17 0549 09/18/17 1759      Assessment/Plan: s/p Procedure(s): RIGHT BREAST PARTIAL MASTECTOMY WITH RADIOACTIVE SEED LOCALIZATION Severe COPD She is somewhat drowsy this morning.  Also has not voided.  Heart rate somewhat elevated. We will check ABGs.  She is to receive Cardizem this morning.  Get up out of bed.  See how she voids and possible discharge later today.    LOS: 0 days    Edward Jolly 09/19/2017

## 2017-09-19 NOTE — Progress Notes (Signed)
Pt pulling at BIPAP mask, will not keep on. RN aware.

## 2017-09-20 ENCOUNTER — Telehealth: Payer: Self-pay | Admitting: *Deleted

## 2017-09-20 ENCOUNTER — Ambulatory Visit: Payer: Commercial Indemnity | Admitting: Oncology

## 2017-09-20 NOTE — Discharge Instructions (Signed)
[facility] °Office Phone Number [phone] ° °BREAST BIOPSY/ PARTIAL MASTECTOMY: POST OP INSTRUCTIONS ° °Always review your discharge instruction sheet given to you by the facility where your surgery was performed. ° °IF YOU HAVE DISABILITY OR FAMILY LEAVE FORMS, YOU MUST BRING THEM TO THE OFFICE FOR PROCESSING.  DO NOT GIVE THEM TO YOUR DOCTOR. ° °1. A prescription for pain medication may be given to you upon discharge.  Take your pain medication as prescribed, if needed.  If narcotic pain medicine is not needed, then you may take acetaminophen (Tylenol) or ibuprofen (Advil) as needed. °2. Take your usually prescribed medications unless otherwise directed °3. If you need a refill on your pain medication, please contact your pharmacy.  They will contact our office to request authorization.  Prescriptions will not be filled after 5pm or on week-ends. °4. You should eat very light the first 24 hours after surgery, such as soup, crackers, pudding, etc.  Resume your normal diet the day after surgery. °5. Most patients will experience some swelling and bruising in the breast.  Ice packs and a good support bra will help.  Swelling and bruising can take several days to resolve.  °6. It is common to experience some constipation if taking pain medication after surgery.  Increasing fluid intake and taking a stool softener will usually help or prevent this problem from occurring.  A mild laxative (Milk of Magnesia or Miralax) should be taken according to package directions if there are no bowel movements after 48 hours. °7. Unless discharge instructions indicate otherwise, you may remove your bandages 24-48 hours after surgery, and you may shower at that time.  You may have steri-strips (small skin tapes) in place directly over the incision.  These strips should be left on the skin for 7-10 days.  If your surgeon used skin glue on the incision, you may shower in 24 hours.  The glue will flake off over the  next 2-3 weeks.  Any sutures or staples will be removed at the office during your follow-up visit. °8. ACTIVITIES:  You may resume regular daily activities (gradually increasing) beginning the next day.  Wearing a good support bra or sports bra minimizes pain and swelling.  You may have sexual intercourse when it is comfortable. °a. You may drive when you no longer are taking prescription pain medication, you can comfortably wear a seatbelt, and you can safely maneuver your car and apply brakes. °b. RETURN TO WORK:  ______________________________________________________________________________________ °9. You should see your doctor in the office for a follow-up appointment approximately two weeks after your surgery.  Your doctor’s nurse will typically make your follow-up appointment when she calls you with your pathology report.  Expect your pathology report 2-3 business days after your surgery.  You may call to check if you do not hear from us after three days. °10. OTHER INSTRUCTIONS: _______________________________________________________________________________________________ _____________________________________________________________________________________________________________________________________ °_____________________________________________________________________________________________________________________________________ °_____________________________________________________________________________________________________________________________________ ° °WHEN TO CALL YOUR DOCTOR: °1. Fever over 101.0 °2. Nausea and/or vomiting. °3. Extreme swelling or bruising. °4. Continued bleeding from incision. °5. Increased pain, redness, or drainage from the incision. ° °The clinic staff is available to answer your questions during regular business hours.  Please don’t hesitate to call and ask to speak to one of the nurses for clinical concerns.  If you have a medical emergency, go to the nearest  emergency room or call 911.  A surgeon from [facility] is always on call at the hospital. ° °For further questions, please visit [email or web address]  °

## 2017-09-20 NOTE — Progress Notes (Addendum)
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  OR case and anesthesia were unremarkable.  However, on the morning of 7/24 she was noted to be significantly lethargic.  ABG revealed respiratory acidosis, hypoxia and hypercarbia with PCO2 76.  PCCM consulted.  SIGNIFICANT EVENTS    STUDIES:    SUBJECTIVE:  Remarkable better this am. Awake, alert, sitting OOB in chair talking with family.  Denies SOB above baseline.  Hungry.   Anxious to go home.   VITAL SIGNS: Temp:  [97.6 F (36.4 C)-99.4 F (37.4 C)] 98.1 F (36.7 C) (07/25 0830) Pulse Rate:  [106-130] 107 (07/25 0437) Resp:  [15-28] 28 (07/25 0437) BP: (123-166)/(82-96) 147/96 (07/25 0830) SpO2:  [90 %-98 %] 93 % (07/25 0830) FiO2 (%):  [50 %-60 %] 50 % (07/25 0224)  PHYSICAL EXAMINATION: General:  Pleasant, chronically ill appearing female, NAD in chair  HEENT: MM pink/moist Neuro: awake, alert, appropriate, MAE  CV: s1s2 rrr, no m/r/g PULM: even/non-labored, lungs bilaterally diminished, no audible wheeze  ST:MHDQ, non-tender, bsx4 active  Extremities: warm/dry, no edema  Skin: no rashes or lesions, R chest/breast dressing c/d    Recent Labs  Lab 09/13/17 1332 09/18/17 0652  NA 141 141  K 3.7 3.7  CL 105  --   CO2 27  --   BUN 8  --   CREATININE 0.70  --   GLUCOSE 124*  --    Recent Labs  Lab 09/13/17 1332 09/18/17 0652  HGB 13.5 13.6  HCT 46.2* 40.0  WBC 9.2  --   PLT 235  --    Dg Chest Portable 1 View  Result Date: 09/19/2017 CLINICAL DATA:  Respiratory failure EXAM: PORTABLE CHEST 1 VIEW COMPARISON:  None. FINDINGS: There is bibasilar atelectasis. There is also mild  atelectatic change in the right mid lung. No edema or consolidation. Heart size and pulmonary vascularity are normal. No adenopathy. There is aortic atherosclerosis. No bone lesions. IMPRESSION: Atelectatic change in the right mid lung in both bases. No frank edema or consolidation. Heart size within normal limits. There is aortic atherosclerosis. Aortic Atherosclerosis (ICD10-I70.0). Electronically Signed   By: Lowella Grip III M.D.   On: 09/19/2017 11:33    ASSESSMENT / PLAN:  Acute on chronic hypoxic and hypercarbic respiratory failure -in setting severe COPD complicated by anesthesia and narcotics for right breast lumpectomy. Resolved.  Severe COPD-4 L O2 dependent at home.  Easily desaturates with any activity per husband.  Acute encephalopathy-related to hypercarbia.  Resolved.    Pt drastically improved.  Near baseline.  Off bipap since late yesterday afternoon.    REC -  Continue to avoid any sedating medications - no narcotics  Supplemental O2 as needed to keep sats 88-90% (4-5L home O2)  Mobilize  Can resume home symbicort, spiriva on d/c  No need for steroids  outpt pulmonary f/u - has pulmonologist at [facility] for d/c home from pulmonary standpoint    Nickolas Madrid, NP 09/20/2017  9:56 AM Pager: [phone] or [phone]  Attending Note:  72 year old female with PMH of COPD who underwent a right breast lumpectomy and now presents with acute on  chronic hypercarbic respiratory failure and PCCM was called for consultation.  Home O2 dose of 4-5L.  Off BiPAP since PM.  On exam, sitting up in a chair with decreased BS but seems close to baseline.  I reviewed CXR myself, hyperinflation noted, new one ordered for today.  Discussed with PCCM-NP.  Hypercarbic respiratory failure: likely a combination of narcotic, anesthesia and positioning that resulted in her loss of mechanical advantage for breathing and unsure if able to take her MDI.             - D/C BiPAP              - No further need for ABG  COPD:             - Can change inhalers back to MDI upon discharge, no need for nebs at home.  Use home medications (forumlary issues).             - No need for steroids  Hypoxemia:             - Titrate O2 for sat of 88-92%             - Avoid hyper oxygenation  S/P lumpectomy:             - Avoid all narcotic as able (understand just had surgery however).             - If narcotics are to be used then will need BiPAP at night to avoid respiratory failure  May discharge to home per surgery on standard home O2 and on home medications  PCCM will sign off, please call back if needed.  Patient seen and examined, agree with above note.  I dictated the care and orders written for this patient under my direction.  Rush Farmer, [place]

## 2017-09-20 NOTE — Progress Notes (Signed)
Pt has been noncompliant with bipap throughout shift. Managed to keep on for a couple of hours. Switched back to 5L [place]. O2 sat between 88-94%. Pt complained of headache and nausea. VSS. MD aware. Daughter at bedside. Pt is resting. Will continue to monitor.   Fransico Michael, RN

## 2017-09-20 NOTE — Progress Notes (Signed)
D/c instructions given to pt and family. Wound care instructions given. All questions answered. IV removed, clean and intact. Telemetry removed. Husband and daughter to escort pt home with home oxygen.  Clyde Canterbury, RN

## 2017-09-20 NOTE — Progress Notes (Addendum)
Patient ID: Ashley Conrad, female   DOB: 1945-07-26, 72 y.o.   MRN: [id number] 2 Days Post-Op   Subjective: Much more alert this morning.  Still some mild confusion.  Foley catheter was placed last night due to inability to void.  She also has been nauseated and got some Phenergan last night.  Has not really eaten anything yet.  Objective: Vital signs in last 24 hours: Temp:  [97.6 F (36.4 C)-99.4 F (37.4 C)] 98.1 F (36.7 C) (07/25 0830) Pulse Rate:  [106-130] 107 (07/25 0437) Resp:  [15-28] 28 (07/25 0437) BP: (123-166)/(82-96) 147/96 (07/25 0830) SpO2:  [88 %-98 %] 93 % (07/25 0830) FiO2 (%):  [50 %-60 %] 50 % (07/25 0224) Last BM Date: 09/17/17  Intake/Output from previous day: 07/24 0701 - 07/25 0700 In: 743.2 [P.O.:360; I.V.:383.2] Out: 700 [Urine:700] Intake/Output this shift: No intake/output data recorded.  General appearance: alert, cooperative and no distress Resp: clear to auscultation bilaterally and Without wheezing or increased work of breathing Incision/Wound: Dressing clean and dry Neuro: Alert and appropriate with some very mild confusion regarding recent details  Lab Results:  Recent Labs    09/18/17 0652  HGB 13.6  HCT 40.0   BMET Recent Labs    09/18/17 0652  NA 141  K 3.7     Studies/Results: Dg Chest Portable 1 View  Result Date: 09/19/2017 CLINICAL DATA:  Respiratory failure EXAM: PORTABLE CHEST 1 VIEW COMPARISON:  None. FINDINGS: There is bibasilar atelectasis. There is also mild atelectatic change in the right mid lung. No edema or consolidation. Heart size and pulmonary vascularity are normal. No adenopathy. There is aortic atherosclerosis. No bone lesions. IMPRESSION: Atelectatic change in the right mid lung in both bases. No frank edema or consolidation. Heart size within normal limits. There is aortic atherosclerosis. Aortic Atherosclerosis (ICD10-I70.0). Electronically Signed   By: Lowella Grip III M.D.   On: 09/19/2017  11:33    Anti-infectives: Anti-infectives (From admission, onward)   Start     Dose/Rate Route Frequency Ordered Stop   09/18/17 0600  ceFAZolin (ANCEF) IVPB 2g/100 mL premix     2 g 200 mL/hr over 30 Minutes Intravenous On call to O.R. 09/18/17 5852 09/18/17 0743   09/18/17 0549  ceFAZolin (ANCEF) 2-4 GM/100ML-% IVPB    Note to Pharmacy:  Debbe Bales, Meredit: cabinet override      09/18/17 0549 09/18/17 1759      Assessment/Plan: s/p Procedure(s): RIGHT BREAST PARTIAL MASTECTOMY WITH RADIOACTIVE SEED LOCALIZATION No specific issues regarding her surgery but some respiratory decompensation postop with severe COPD.  This appears improved today with baseline O2 sats and improved mental status.  She has been off of BiPAP since the middle the night last night. Required Foley catheter.  We will discontinue this for voiding trial. Postop nausea.  Uncertain etiology.  Observe for now. We will see how she does with her nausea and voiding.  Pulmonary to see later today.  Possible discharge home after this.    LOS: 1 day    Edward Jolly 09/20/2017

## 2017-09-20 NOTE — Telephone Encounter (Signed)
Received order for oncotype testing. Requisition faxed to pathology. Received by Keisha 

## 2017-09-27 ENCOUNTER — Telehealth: Payer: Self-pay | Admitting: *Deleted

## 2017-09-27 NOTE — Telephone Encounter (Signed)
Received oncotype score of 10/3%. Physician team notified.

## 2017-09-28 ENCOUNTER — Encounter (HOSPITAL_COMMUNITY): Payer: Self-pay | Admitting: Oncology

## 2017-10-02 NOTE — Discharge Summary (Addendum)
Patient ID: Ashley Conrad [id number] 72 y.o. 23-Nov-1945  Admission date: September 18, 2017  Discharge date and time: 09/20/2017   Admitting Physician: Edward Jolly  Discharge Physician: Edward Jolly  Admission Diagnoses: CANCER UPPER OUTER RIGHT BREAST  Discharge Diagnoses: Same.  Acute postoperative respiratory failure.  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 history of right breast lumpecttmy in the same area with apparent axillary dissection, radiation therapy and chemotherapy late 1980s.I discussed with the patient and family members present today initial surgical treatment options. we discussed that standard therapy in this situation would be total mastectomy. She however has significant comorbidities with oxygen-dependent COPD. She has large breasts which would require much more surgery than local excision and with the tumor being very peripheral at the lateral breast we would really not obtain significantly additional margins. I think a reasonable option would be a partial mastectomy to obtain as widely negative margins as possible and follow up with hormonal therapy based on her overall condition. After discussion of pros and cons she is in agreement with this plan.  Preoperatively the patient underwent clearance by her pulmonary specialist in [facility] who felt her to be at increased but acceptable risk for this nonelective surgery.    Hospital Course: On the morning of admission she underwent an uneventful right partial mastectomy with radioactive seed localization.  The surgery was uncomplicated.  She was extubated without difficulty and in the early  postoperative period had no particular respiratory difficulties.  She had no apparent difficulties during planned overnight hospitalization but the following morning was noted to be very drowsy to the point of being difficult to arouse.  ABGs were obtained showing a PCO2 in the 70s and PO2 in the 60s.  She was started on BiPAP with immediate improvement in her level of consciousness and oxygen saturations.  Pulmonary consult patient was obtained and they evaluated the patient and followed her through the remainder of her hospitalization.  She did not receive any postoperative narcotics other than one oxycodone on the day of surgery.  She continued her home meds and BiPAP.  The following day her BiPAP was able to be weaned and used only intermittently.  On 7/25 she was maintaining her O2 saturations without BiPAP and was alert and comfortable.  She was felt to be at baseline by pulmonary and okay for discharge and was discharged home.  Incision is clean and dry without complicating factors.  Consults: pulmonary/intensive care  Significant Diagnostic Studies: ABGs, chest x-ray  Treatments: respiratory therapy: BiPAP  Disposition: Home  Patient Instructions:  Allergies as of 09/20/2017      Reactions   Cyclobenzaprine Other (See Comments)   Cognitive issues.   Shellfish Allergy Itching   Itching      Medication List    STOP taking these medications   fluconazole 100 MG tablet Commonly known as:  DIFLUCAN     TAKE these medications   acetaminophen 500 MG tablet Commonly known as:  TYLENOL Take 1,000 mg by mouth every 6 (six) hours as needed.   albuterol 108 (90 Base) MCG/ACT inhaler Commonly known as:  PROVENTIL HFA;VENTOLIN HFA Inhale 2 puffs into the lungs 2 (two) times daily as needed for wheezing or shortness of breath.  atorvastatin 10 MG tablet Commonly known as:  LIPITOR Take 10 mg by mouth daily.   budesonide-formoterol 160-4.5 MCG/ACT inhaler Commonly known as:   SYMBICORT Inhale 2 puffs into the lungs 2 (two) times daily.   cetirizine 10 MG tablet Commonly known as:  ZYRTEC Take 10 mg by mouth daily.   denosumab 60 MG/ML Sosy injection Commonly known as:  PROLIA Inject 60 mg into the skin every 6 (six) months.   diltiazem 240 MG 24 hr capsule Commonly known as:  TIAZAC Take 240 mg by mouth daily.   DULoxetine 30 MG capsule Commonly known as:  CYMBALTA Take 30 mg by mouth daily.   fluticasone 50 MCG/ACT nasal spray Commonly known as:  FLONASE Place 1 spray into both nostrils daily as needed (for allergies.).   gabapentin 300 MG capsule Commonly known as:  NEURONTIN Take 300-600 mg by mouth 3 (three) times daily as needed. For pain.   Magnesium Oxide 400 (240 Mg) MG Tabs Take 240 mg by mouth daily.   montelukast 10 MG tablet Commonly known as:  SINGULAIR Take 10 mg by mouth at bedtime.   omeprazole 20 MG capsule Commonly known as:  PRILOSEC Take 20 mg by mouth daily before breakfast.   SPIRIVA RESPIMAT 2.5 MCG/ACT Aers Generic drug:  Tiotropium Bromide Monohydrate Inhale 1 puff into the lungs daily.   VITAMIN D2 PO Take 50,000 Units by mouth every 30 (thirty) days.       Activity: activity as tolerated Diet: regular diet Wound Care: none needed  Follow-up:  With Dr. Excell Seltzer in 3 weeks.  Signed: Edward Jolly MD, FACS  10/02/2017, 7:06 PM

## 2017-10-11 NOTE — Progress Notes (Signed)
Ashley Conrad  [phone] [phone]     ID: Ashley Conrad DOB: 04/07/1945  MR#: [id number]  PFX#:[id number]  Patient Care Team: Raelene Bott, MD as PCP - General (Internal Medicine) Excell Seltzer, MD as Consulting Physician (General Surgery) Kiera Hussey, Virgie Dad, MD as Consulting Physician (Oncology) Eppie Gibson, MD as Attending Physician (Radiation Oncology) Larey Days, MD as Referring Physician (Internal Medicine) OTHER MD: Pulmonary Dr. Melvern Sample in [place].    CHIEF COMPLAINT: Estrogen receptor positive breast cancer  CURRENT TREATMENT: Anastrozole   HISTORY OF CURRENT ILLNESS: The original intake note:  Ashley Conrad has a prior history of right-sided breast cancer, dating back to 1989.  She was treated by Dr. Gaynelle Arabian and Dr. Arloa Koh, with lumpectomy and full axillary lymph node dissection, chemotherapy (for 6 months, so most likely CMF) and then radiation.  She did not receive antiestrogens.   More recently patient had routine screening mammography on 07/17/2017 showing a possible asymmetry in the right breast. She underwent bilateral diagnostic mammography with tomography and right breast ultrasonography at  [facility] on 07/24/2017 showing: breast density category A. There is a 1.1 cm mass and a 0.8 cm mass upper outer quadrant of the right breast found mammographically. These hypoechoic masses measure 1.5 cm and 0.8 cm sonographically and were located at the 11 o'clock upper outer quadrant posteriorly and 11 cm from the nipple. There was no evidence of lymphadenopathy in the right axilla.   Accordingly on 07/31/2017 she proceeded to biopsy of the 2 right breast masses in question. The pathology from this procedure showed ([id number]): The 1.1cm mass and 0.8 cm mass showed invasive and in situ ductal carcinoma grade II or III. The 1.1 cm mass was significant for prognostic indicators: estrogen receptor, 90%  positive with strong staining intensity and progesterone receptor, 70% positive with moderate staining intensity. Proliferation marker Ki67 at 10%. HER2 not amplified with ratios HER2/CEP17 signals 1.52 and average HER2 copies per cell 3.50. The 0.8 cm mass was significant for prognostic indicators: estrogen receptor, 100% positive with strong staining intensity and progesterone receptor, 10% positive with moderate staining intentisty. Proliferation marker Ki67 at 10%. HER2 not amplified with ratios HER2/CEP17 signals 1.22 and average HER2 copies per cell 2.75.   The patient's subsequent history is as detailed below.  INTERVAL HISTORY: Ashley Conrad returns today for follow up and treatment of her estrogen receptor positive breast cancer accompanied by her husband and granddaughter.   Since her last visit, she completed a right lumpectomy ([id number]) on 09/18/2017 with pathology showing: Multifocal invasive ductal carcinoma, grade 2, spanning 1.5 cm in greatest extent. Invasive tumor comes within 0.1-0.2 cm of the inferior and 0.2-0.3 cm of the inferior- posterior margin. Margin were negative for in situ carcinoma.   Ashley Conrad tolerated surgery well. She denies pain or fever. She notes that she never had an issue with hot flashes.  She get Prolia injections every 6 months under Dr. Heber Palmyra.   REVIEW OF SYSTEMS: Ashley Conrad reports that she has been taking it easy over the last few days. She tried to walk more, but she had some SOB. She saw  Dr. Silas Flood in [place]. She is planning to have breathing tests completed. She denies unusual headaches, visual changes, nausea, vomiting, or dizziness. There has been no unusual cough, phlegm production, or pleurisy. There has been no change in bowel or bladder habits. She denies unexplained fatigue or unexplained weight loss, bleeding, rash, or fever. A detailed review of systems was otherwise  stable.   PAST MEDICAL HISTORY: Past Medical History:  Diagnosis Date  .  Cancer (HCC)    BREAST  . COPD (chronic obstructive pulmonary disease) ([place])   . Dyspnea   . Family history of breast cancer   . Family history of prostate cancer   . Family history of stomach cancer   . GERD (gastroesophageal reflux disease)   . Headache   . Heart murmur    AGE  72    . History of right breast cancer   . Hypertension   . Oxygen dependent    4 LITERS   . Personal history of breast cancer 08/15/2017   She denies any heart issues such as palpitations, murmur, or disease. She denies asthma, emphysema, cataracts, headaches, HTN, diabetes, issues with bladder or bowel movements.   PAST SURGICAL HISTORY: Past Surgical History:  Procedure Laterality Date  . APPENDECTOMY    . BREAST LUMPECTOMY WITH RADIOACTIVE SEED LOCALIZATION Right 09/18/2017   Procedure: RIGHT BREAST PARTIAL MASTECTOMY WITH RADIOACTIVE SEED LOCALIZATION;  Surgeon: Excell Seltzer, MD;  Location: [facility];  Service: General;  Laterality: Right;  . CATARACT EXTRACTION, BILATERAL    . MASTECTOMY, PARTIAL Right 09/18/2017  . right lumpectomy    . TONSILECTOMY, ADENOIDECTOMY, BILATERAL MYRINGOTOMY AND TUBES      FAMILY HISTORY Family History  Problem Relation Age of Onset  . Breast cancer Mother 69  . Prostate cancer Father 63       had radiation  . Prostate cancer Brother 18       has surgery  . Stomach cancer Paternal Aunt        early 23's  . Diabetes Maternal Grandmother   . Stomach cancer Paternal Aunt        early 88's   The patient's father died at age 70 due to CHF. The patient's mother died at age 2 due to aneurysm. The patient's mother was also diagnosed with breast cancer at age 13. The patient's father was diagnosed with prostate cancer at age 52. The patient's brother was diagnosed with prostate cancer at age 32. The patient denies a history of ovarian cancer in the family.   GYNECOLOGIC HISTORY:  No LMP recorded. Patient is postmenopausal. Menarche: 72 years old Age at first live  birth: 72 years old She is GXP2. Her LMP was November 1989 in her mid 25's. She never used contraception or HRT.    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 Her husband, Elenore Rota, is retired from working in Charity fundraiser. The patient's oldest daughter, [name], lives in [place] as an Web designer at [employer]. The patient's son, [name]., also lives in [place] and works in Scientist, research (life sciences). The patient has 2 grandchildren. Her granddaughter [name] is a Equities trader at [employer] social work. The patient attends [employer].     HEALTH MAINTENANCE: Social History   Tobacco Use  . Smoking status: Former Smoker    Last attempt to quit: 08/08/2012    Years since quitting: 5.1  . Smokeless tobacco: Never Used  Substance Use Topics  . Alcohol use: Not Currently  . Drug use: Never     Colonoscopy: Dr. Heber Numa 2011/ normal  PAP:  Bone density: normal 2018   Allergies  Allergen Reactions  . Cyclobenzaprine Other (See Comments)    Cognitive issues.  . Shellfish Allergy Itching    Itching    Current Outpatient Medications  Medication Sig Dispense Refill  .  acetaminophen (TYLENOL) 500 MG tablet Take 1,000 mg by mouth every 6 (six) hours as needed.    Marland Kitchen albuterol (PROVENTIL HFA;VENTOLIN HFA) 108 (90 Base) MCG/ACT inhaler Inhale 2 puffs into the lungs 2 (two) times daily as needed for wheezing or shortness of breath.    Marland Kitchen atorvastatin (LIPITOR) 10 MG tablet Take 10 mg by mouth daily.    . budesonide-formoterol (SYMBICORT) 160-4.5 MCG/ACT inhaler Inhale 2 puffs into the lungs 2 (two) times daily.    . cetirizine (ZYRTEC) 10 MG tablet Take 10 mg by mouth daily.    Marland Kitchen denosumab (PROLIA) 60 MG/ML SOSY injection Inject 60 mg into the skin every 6 (six) months.    . diltiazem (TIAZAC) 240 MG 24 hr capsule Take 240 mg by mouth daily.    . DULoxetine (CYMBALTA) 30 MG capsule Take 30 mg by mouth daily.    . Ergocalciferol  (VITAMIN D2 PO) Take 50,000 Units by mouth every 30 (thirty) days.     . fluticasone (FLONASE) 50 MCG/ACT nasal spray Place 1 spray into both nostrils daily as needed (for allergies.).     Marland Kitchen gabapentin (NEURONTIN) 300 MG capsule Take 300-600 mg by mouth 3 (three) times daily as needed. For pain.  3  . Magnesium Oxide 400 (240 Mg) MG TABS Take 240 mg by mouth daily.    . montelukast (SINGULAIR) 10 MG tablet Take 10 mg by mouth at bedtime.    Marland Kitchen omeprazole (PRILOSEC) 20 MG capsule Take 20 mg by mouth daily before breakfast.     . SPIRIVA RESPIMAT 2.5 MCG/ACT AERS Inhale 1 puff into the lungs daily.     No current facility-administered medications for this visit.     OBJECTIVE: Older African-American woman examined in a wheelchair, wearing oxygen by nasal cannula  Vitals:   10/12/17 1033  BP: 140/76  Pulse: (!) 103  Resp: (!) 21  Temp: 97.8 F (36.6 C)  SpO2: 90%     Body mass index is 33.49 kg/m.   Wt Readings from Last 3 Encounters:  10/12/17 171 lb 8 oz (77.8 kg)  08/08/17 172 lb 11.2 oz (78.3 kg)      ECOG FS:2 - Symptomatic, <50% confined to bed  Sclerae unicteric, EOMs intact No cervical or supraclavicular adenopathy Lungs no rales or rhonchi Heart regular rate and rhythm Abd soft, obese, nontender, positive bowel sounds Neuro: nonfocal, well oriented, appropriate affect Breasts: Right breast is status post recent surgery.  The incision is healing nicely, without erythema dehiscence or swelling.  LAB RESULTS:  CMP     Component Value Date/Time   NA 141 09/18/2017 0652   K 3.7 09/18/2017 0652   CL 105 09/13/2017 1332   CO2 27 09/13/2017 1332   GLUCOSE 124 (H) 09/13/2017 1332   BUN 8 09/13/2017 1332   CREATININE 0.70 09/13/2017 1332   CREATININE 0.78 08/08/2017 0838   CALCIUM 9.0 09/13/2017 1332   PROT 7.2 08/14/2017 1427   ALBUMIN 3.6 08/14/2017 1427   AST 15 08/14/2017 1427   AST 12 08/08/2017 0838   ALT 13 08/14/2017 1427   ALT 11 08/08/2017 0838   ALKPHOS  118 08/14/2017 1427   BILITOT 0.2 08/14/2017 1427   BILITOT 0.4 08/08/2017 0838   GFRNONAA >60 09/13/2017 1332   GFRNONAA >60 08/08/2017 0838   GFRAA >60 09/13/2017 1332   GFRAA >60 08/08/2017 0838    No results found for: TOTALPROTELP, ALBUMINELP, A1GS, A2GS, BETS, BETA2SER, GAMS, MSPIKE, SPEI  No results found for: KPAFRELGTCHN, LAMBDASER,  [facility]  Lab Results  Component Value Date   WBC 9.2 09/13/2017   NEUTROABS 8.2 (H) 08/14/2017   HGB 13.6 09/18/2017   HCT 40.0 09/18/2017   MCV 106.2 (H) 09/13/2017   PLT 235 09/13/2017    @LASTCHEMISTRY @  No results found for: LABCA2  No components found for: [id number]  No results for input(s): INR in the last 168 hours.  No results found for: LABCA2  No results found for: BSW967  No results found for: RFF638  No results found for: GYK599  No results found for: CA2729  No components found for: HGQUANT  No results found for: CEA1 / No results found for: CEA1   No results found for: AFPTUMOR  No results found for: CHROMOGRNA  No results found for: PSA1  No visits with results within 3 Day(s) from this visit.  Latest known visit with results is:  Admission on 09/18/2017, Discharged on 09/20/2017  Component Date Value Ref Range Status  . pH, Arterial 09/18/2017 7.349* 7.350 - 7.450 Final  . pCO2 arterial 09/18/2017 55.8* 32.0 - 48.0 mmHg Final  . pO2, Arterial 09/18/2017 54.0* 83.0 - 108.0 mmHg Final  . Bicarbonate 09/18/2017 30.8* 20.0 - 28.0 mmol/L Final  . TCO2 09/18/2017 32  22 - 32 mmol/L Final  . O2 Saturation 09/18/2017 85.0  % Final  . Acid-Base Excess 09/18/2017 4.0* 0.0 - 2.0 mmol/L Final  . Sodium 09/18/2017 141  135 - 145 mmol/L Final  . Potassium 09/18/2017 3.7  3.5 - 5.1 mmol/L Final  . Calcium, Ion 09/18/2017 1.27  1.15 - 1.40 mmol/L Final  . HCT 09/18/2017 40.0  36.0 - 46.0 % Final  . Hemoglobin 09/18/2017 13.6  12.0 - 15.0 g/dL Final  . Patient temperature 09/18/2017 HIDE   Final  . Sample  type 09/18/2017 ARTERIAL   Final  . O2 Content 09/18/2017 4.0  L/min Final  . Delivery systems 09/18/2017 NO CHARGE   Final  . pH, Arterial 09/18/2017 7.260* 7.350 - 7.450 Final  . pCO2 arterial 09/18/2017 68.9* 32.0 - 48.0 mmHg Final   Comment: CRITICAL RESULT CALLED TO, READ BACK BY AND VERIFIED WITH: C.BLITZER,RN @ 0925 BY S.HARTLEY,RRT ON 09/18/17   . pO2, Arterial 09/18/2017 58.9* 83.0 - 108.0 mmHg Final  . Bicarbonate 09/18/2017 30.0* 20.0 - 28.0 mmol/L Final  . Acid-Base Excess 09/18/2017 3.4* 0.0 - 2.0 mmol/L Final  . O2 Saturation 09/18/2017 85.8  % Final  . Patient temperature 09/18/2017 97.9   Final  . Collection site 09/18/2017 LEFT RADIAL   Final  . Drawn by 09/18/2017 [id number]   Final  . Sample type 09/18/2017 ARTERIAL DRAW   Final  . Chauncey Reading test (pass/fail) 09/18/2017 PASS  PASS Final  . O2 Content 09/19/2017 5.0  L/min Final  . Delivery systems 09/19/2017 NASAL CANNULA   Final  . pH, Arterial 09/19/2017 7.224* 7.350 - 7.450 Final   Comment: CRITICAL RESULT CALLED TO, READ BACK BY AND VERIFIED WITH:  Donella Stade, RN AT 0900 BY L KELLEY RRT, RCP ON7/24/19   . pCO2 arterial 09/19/2017 76.0* 32.0 - 48.0 mmHg Final   Comment: CRITICAL RESULT CALLED TO, READ BACK BY AND VERIFIED WITH:  Donella Stade, RN AT 0900 BY L KELLEY RRT RCP ON 09/19/17   . pO2, Arterial 09/19/2017 53.7* 83.0 - 108.0 mmHg Final  . Bicarbonate 09/19/2017 30.3* 20.0 - 28.0 mmol/L Final  . Acid-Base Excess 09/19/2017 3.2* 0.0 - 2.0 mmol/L Final  . O2 Saturation 09/19/2017 83.2  % Final  .  Patient temperature 09/19/2017 98.6   Final  . Collection site 09/19/2017 LEFT RADIAL   Final  . Drawn by 09/19/2017 [id number]   Final  . Sample type 09/19/2017 ARTERIAL DRAW   Final  . Chauncey Reading test (pass/fail) 09/19/2017 PASS  PASS Final  . FIO2 09/19/2017 60.00   Final  . Delivery systems 09/19/2017 BILEVEL POSITIVE AIRWAY PRESSURE   Final  . LHR 09/19/2017 18.0  resp/min Final  . Inspiratory PAP 09/19/2017 16.0    Final  . Expiratory PAP 09/19/2017 8.0   Final  . pH, Arterial 09/19/2017 7.264* 7.350 - 7.450 Final  . pCO2 arterial 09/19/2017 66.3* 32.0 - 48.0 mmHg Final   Comment: CRITICAL RESULT CALLED TO, READ BACK BY AND VERIFIED WITH: hanna weisner RN @12 :13 by Willey Blade on 09/19/17   . pO2, Arterial 09/19/2017 70.3* 83.0 - 108.0 mmHg Final  . Bicarbonate 09/19/2017 29.0* 20.0 - 28.0 mmol/L Final  . Acid-Base Excess 09/19/2017 2.6* 0.0 - 2.0 mmol/L Final  . O2 Saturation 09/19/2017 92.7  % Final  . Patient temperature 09/19/2017 98.6   Final  . Collection site 09/19/2017 LEFT BRACHIAL   Final  . Drawn by 09/19/2017 [id number]   Final  . Sample type 09/19/2017 ARTERIAL DRAW   Final  . Chauncey Reading test (pass/fail) 09/19/2017 PASS  PASS Final    (this displays the last labs from the last 3 days)  No results found for: TOTALPROTELP, ALBUMINELP, A1GS, A2GS, BETS, BETA2SER, GAMS, MSPIKE, SPEI (this displays SPEP labs)  No results found for: KPAFRELGTCHN, LAMBDASER, KAPLAMBRATIO (kappa/lambda light chains)  No results found for: HGBA, HGBA2QUANT, HGBFQUANT, HGBSQUAN (Hemoglobinopathy evaluation)   No results found for: LDH  No results found for: IRON, TIBC, IRONPCTSAT (Iron and TIBC)  No results found for: FERRITIN  Urinalysis No results found for: COLORURINE, APPEARANCEUR, LABSPEC, PHURINE, GLUCOSEU, HGBUR, BILIRUBINUR, KETONESUR, PROTEINUR, UROBILINOGEN, NITRITE, LEUKOCYTESUR   STUDIES: Dg Chest Portable 1 View  Result Date: 09/19/2017 CLINICAL DATA:  Respiratory failure EXAM: PORTABLE CHEST 1 VIEW COMPARISON:  None. FINDINGS: There is bibasilar atelectasis. There is also mild atelectatic change in the right mid lung. No edema or consolidation. Heart size and pulmonary vascularity are normal. No adenopathy. There is aortic atherosclerosis. No bone lesions. IMPRESSION: Atelectatic change in the right mid lung in both bases. No frank edema or consolidation. Heart size within normal limits. There  is aortic atherosclerosis. Aortic Atherosclerosis (ICD10-I70.0). Electronically Signed   By: Lowella Grip III M.D.   On: 09/19/2017 11:33    ELIGIBLE FOR AVAILABLE RESEARCH PROTOCOL: no  ASSESSMENT: 72 y.o[place], [place] woman  (1) status post right lumpectomy and axillary lymph node dissection 1989 for breast cancer, no pathologic information available  (a) status post 6 months of adjuvant chemotherapy  (b) status post adjuvant radiation  (2) status post right breast upper outer quadrant biopsy 07/31/2017 for a clinical T1c N0, stage IA invasive ductal carcinoma, grade 2 or 3, estrogen and progesterone receptor positive, HER-2 not amplified, with an MIB-1 of 10%  (3) status post right lumpectomy 0723 2019 for an mpT1c invasive ductal carcinoma, grade 2, with negative margins  (4)  Oncotype DX score of 10 predicts a 9-year risk of recurrence outside the breast of 3% if the patient's only systemic therapy is tamoxifen for 5 years.  It also predicts no significant benefit from chemotherapy.  (5) anastrozole started 10/12/2017  (6) genetics testing offered through [facility] Multi-cancer panel on 08/14/2017 showed: no deleterious mutations. The following genes were evaluated for sequence  changes and exonic deletions/duplications: APC, ATM, AXIN2, BARD1, BMPR1A, BRCA1, BRCA2, BRIP1, CDH1, CDK4, CDKN2A (p14ARF), CDKN2A (p16INK4a), CHEK2, CTNNA1, DICER1, EPCAM*, GREM1*, KIT, MEN1, MLH1, MSH2, MSH3, MSH6, MUTYH, NBN, NF1, PALB2, PDGFRA, PMS2, POLD1, POLE, PTEN, RAD50, RAD51C, RAD51D, SDHB, SDHC, SDHD, SMAD4, SMARCA4, STK11, TP53, TSC1, TSC2, VHL. The following genes were evaluated for sequence changes only: HOXB13*, NTHL1*, SDHA Results are negative unless otherwise  PLAN: Dioselina did well with her surgery and is now ready to start antiestrogens.  Today we discussed the difference between tamoxifen and anastrozole in detail. She understands that anastrozole and the aromatase inhibitors in general  work by blocking estrogen production. Accordingly vaginal dryness, decrease in bone density, and of course hot flashes can result. The aromatase inhibitors can also negatively affect the cholesterol profile, although that is a minor effect. One out of 5 women on aromatase inhibitors we will feel "old and achy". This arthralgia/myalgia syndrome, which resembles fibromyalgia clinically, does resolve with stopping the medications. Accordingly this is not a reason to not try an aromatase inhibitor but it is a frequent reason to stop it (in other words 20% of women will not be able to tolerate these medications).  Tamoxifen on the other hand does not block estrogen production. It does not "take away a woman's estrogen". It blocks the estrogen receptor in breast cells. Like anastrozole, it can also cause hot flashes. As opposed to anastrozole, tamoxifen has many estrogen-like effects. It is technically an estrogen receptor modulator. This means that in some tissues tamoxifen works like estrogen-- for example it helps strengthen the bones. It tends to improve the cholesterol profile. It can cause thickening of the endometrial lining, and even endometrial polyps or rarely cancer of the uterus.(The risk of uterine cancer due to tamoxifen is one additional cancer per thousand women year). It can cause vaginal wetness or stickiness. It can cause blood clots through this estrogen-like effect--the risk of blood clots with tamoxifen is exactly the same as with birth control pills or hormone replacement.  Neither of these agents causes mood changes or weight gain, despite the popular belief that they can have these side effects. We have data from studies comparing either of these drugs with placebo, and in those cases the control group had the same amount of weight gain and depression as the group that took the drug.  In her case I think anastrozole would be preferable.  We would not have to worry about clot issues.  She is  already on Prolia, which should take care of of the bone density issue from anastrozole.  She is very much in favor of this and I have gone ahead and sent the prescription to [facility].  She will see me again in 3 months.  Assuming she tolerates this well the plan will be to continue anastrozole for a total of 5 years.  As far as her lung problems is concerned she is planning to follow-up closely with her [facility] physicians  She knows to call for any other problems that may develop before the next visit.     Fatimata Talsma, Virgie Dad, MD  10/12/17 10:37 AM Medical Oncology and Hematology [facility] [address] [place], [place] [address] Tel. [phone]    Fax. [phone]  Alice Rieger, am acting as scribe for Chauncey Cruel MD.  I, Lurline Del MD, have reviewed the above documentation for accuracy and completeness, and I agree with the above.

## 2017-10-12 ENCOUNTER — Telehealth: Payer: Self-pay | Admitting: Oncology

## 2017-10-12 ENCOUNTER — Inpatient Hospital Stay: Payer: Commercial Indemnity | Attending: Oncology | Admitting: Oncology

## 2017-10-12 VITALS — BP 140/76 | HR 103 | Temp 97.8°F | Resp 21 | Ht 60.0 in | Wt 171.5 lb

## 2017-10-12 DIAGNOSIS — Z170 Estrogen receptor positive status [ER+]: Secondary | ICD-10-CM | POA: Diagnosis not present

## 2017-10-12 DIAGNOSIS — C50411 Malignant neoplasm of upper-outer quadrant of right female breast: Secondary | ICD-10-CM | POA: Insufficient documentation

## 2017-10-12 DIAGNOSIS — Z87891 Personal history of nicotine dependence: Secondary | ICD-10-CM

## 2017-10-12 MED ORDER — ANASTROZOLE 1 MG PO TABS: 1.0000 mg | ORAL_TABLET | Freq: Every day | ORAL | 4 refills | Status: AC

## 2017-10-12 NOTE — Telephone Encounter (Signed)
Gave patient date of appt on paper and our phone number.  (Printer not working).  Will send calendar.

## 2017-10-13 ENCOUNTER — Inpatient Hospital Stay (HOSPITAL_COMMUNITY)
Admission: AD | Admit: 2017-10-13 | Discharge: 2017-10-28 | DRG: 166 | Disposition: E | Payer: Medicare Other | Source: Other Acute Inpatient Hospital | Attending: Pulmonary Disease | Admitting: Pulmonary Disease

## 2017-10-13 ENCOUNTER — Inpatient Hospital Stay (HOSPITAL_COMMUNITY): Payer: Medicare Other

## 2017-10-13 DIAGNOSIS — R0902 Hypoxemia: Secondary | ICD-10-CM

## 2017-10-13 DIAGNOSIS — Y95 Nosocomial condition: Secondary | ICD-10-CM | POA: Diagnosis not present

## 2017-10-13 DIAGNOSIS — J939 Pneumothorax, unspecified: Secondary | ICD-10-CM | POA: Diagnosis not present

## 2017-10-13 DIAGNOSIS — Z79899 Other long term (current) drug therapy: Secondary | ICD-10-CM

## 2017-10-13 DIAGNOSIS — Z515 Encounter for palliative care: Secondary | ICD-10-CM | POA: Diagnosis not present

## 2017-10-13 DIAGNOSIS — J90 Pleural effusion, not elsewhere classified: Secondary | ICD-10-CM | POA: Diagnosis present

## 2017-10-13 DIAGNOSIS — C50919 Malignant neoplasm of unspecified site of unspecified female breast: Secondary | ICD-10-CM | POA: Diagnosis present

## 2017-10-13 DIAGNOSIS — J810 Acute pulmonary edema: Secondary | ICD-10-CM | POA: Diagnosis not present

## 2017-10-13 DIAGNOSIS — Z8042 Family history of malignant neoplasm of prostate: Secondary | ICD-10-CM

## 2017-10-13 DIAGNOSIS — Z9889 Other specified postprocedural states: Secondary | ICD-10-CM

## 2017-10-13 DIAGNOSIS — C799 Secondary malignant neoplasm of unspecified site: Secondary | ICD-10-CM | POA: Diagnosis present

## 2017-10-13 DIAGNOSIS — Z9911 Dependence on respirator [ventilator] status: Secondary | ICD-10-CM

## 2017-10-13 DIAGNOSIS — J80 Acute respiratory distress syndrome: Secondary | ICD-10-CM | POA: Diagnosis not present

## 2017-10-13 DIAGNOSIS — J156 Pneumonia due to other aerobic Gram-negative bacteria: Secondary | ICD-10-CM | POA: Diagnosis not present

## 2017-10-13 DIAGNOSIS — Z79811 Long term (current) use of aromatase inhibitors: Secondary | ICD-10-CM

## 2017-10-13 DIAGNOSIS — Z7951 Long term (current) use of inhaled steroids: Secondary | ICD-10-CM

## 2017-10-13 DIAGNOSIS — J9622 Acute and chronic respiratory failure with hypercapnia: Secondary | ICD-10-CM | POA: Diagnosis not present

## 2017-10-13 DIAGNOSIS — J910 Malignant pleural effusion: Secondary | ICD-10-CM | POA: Diagnosis present

## 2017-10-13 DIAGNOSIS — J9602 Acute respiratory failure with hypercapnia: Secondary | ICD-10-CM | POA: Diagnosis not present

## 2017-10-13 DIAGNOSIS — I5033 Acute on chronic diastolic (congestive) heart failure: Secondary | ICD-10-CM | POA: Diagnosis not present

## 2017-10-13 DIAGNOSIS — Z9189 Other specified personal risk factors, not elsewhere classified: Secondary | ICD-10-CM

## 2017-10-13 DIAGNOSIS — E876 Hypokalemia: Secondary | ICD-10-CM | POA: Diagnosis not present

## 2017-10-13 DIAGNOSIS — Z170 Estrogen receptor positive status [ER+]: Secondary | ICD-10-CM | POA: Diagnosis not present

## 2017-10-13 DIAGNOSIS — J441 Chronic obstructive pulmonary disease with (acute) exacerbation: Secondary | ICD-10-CM

## 2017-10-13 DIAGNOSIS — E669 Obesity, unspecified: Secondary | ICD-10-CM | POA: Diagnosis not present

## 2017-10-13 DIAGNOSIS — J9601 Acute respiratory failure with hypoxia: Secondary | ICD-10-CM

## 2017-10-13 DIAGNOSIS — E870 Hyperosmolality and hypernatremia: Secondary | ICD-10-CM | POA: Diagnosis not present

## 2017-10-13 DIAGNOSIS — Z9011 Acquired absence of right breast and nipple: Secondary | ICD-10-CM

## 2017-10-13 DIAGNOSIS — Z6831 Body mass index (BMI) 31.0-31.9, adult: Secondary | ICD-10-CM | POA: Diagnosis not present

## 2017-10-13 DIAGNOSIS — Z978 Presence of other specified devices: Secondary | ICD-10-CM

## 2017-10-13 DIAGNOSIS — R000 Tachycardia, unspecified: Secondary | ICD-10-CM | POA: Diagnosis not present

## 2017-10-13 DIAGNOSIS — Z66 Do not resuscitate: Secondary | ICD-10-CM | POA: Diagnosis not present

## 2017-10-13 DIAGNOSIS — E874 Mixed disorder of acid-base balance: Secondary | ICD-10-CM | POA: Diagnosis present

## 2017-10-13 DIAGNOSIS — E1165 Type 2 diabetes mellitus with hyperglycemia: Secondary | ICD-10-CM | POA: Diagnosis not present

## 2017-10-13 DIAGNOSIS — Z923 Personal history of irradiation: Secondary | ICD-10-CM

## 2017-10-13 DIAGNOSIS — Z9981 Dependence on supplemental oxygen: Secondary | ICD-10-CM

## 2017-10-13 DIAGNOSIS — R6521 Severe sepsis with septic shock: Secondary | ICD-10-CM | POA: Diagnosis not present

## 2017-10-13 DIAGNOSIS — J439 Emphysema, unspecified: Principal | ICD-10-CM | POA: Diagnosis present

## 2017-10-13 DIAGNOSIS — J398 Other specified diseases of upper respiratory tract: Secondary | ICD-10-CM | POA: Diagnosis not present

## 2017-10-13 DIAGNOSIS — Z87891 Personal history of nicotine dependence: Secondary | ICD-10-CM

## 2017-10-13 DIAGNOSIS — N179 Acute kidney failure, unspecified: Secondary | ICD-10-CM | POA: Diagnosis not present

## 2017-10-13 DIAGNOSIS — Z8679 Personal history of other diseases of the circulatory system: Secondary | ICD-10-CM

## 2017-10-13 DIAGNOSIS — J9621 Acute and chronic respiratory failure with hypoxia: Secondary | ICD-10-CM | POA: Diagnosis present

## 2017-10-13 DIAGNOSIS — A419 Sepsis, unspecified organism: Secondary | ICD-10-CM | POA: Diagnosis not present

## 2017-10-13 DIAGNOSIS — I110 Hypertensive heart disease with heart failure: Secondary | ICD-10-CM | POA: Diagnosis present

## 2017-10-13 DIAGNOSIS — Z888 Allergy status to other drugs, medicaments and biological substances status: Secondary | ICD-10-CM

## 2017-10-13 DIAGNOSIS — I10 Essential (primary) hypertension: Secondary | ICD-10-CM | POA: Clinically undetermined

## 2017-10-13 DIAGNOSIS — Z4659 Encounter for fitting and adjustment of other gastrointestinal appliance and device: Secondary | ICD-10-CM

## 2017-10-13 DIAGNOSIS — Z9689 Presence of other specified functional implants: Secondary | ICD-10-CM

## 2017-10-13 DIAGNOSIS — C50411 Malignant neoplasm of upper-outer quadrant of right female breast: Secondary | ICD-10-CM | POA: Diagnosis not present

## 2017-10-13 DIAGNOSIS — Z7189 Other specified counseling: Secondary | ICD-10-CM | POA: Diagnosis not present

## 2017-10-13 DIAGNOSIS — C50911 Malignant neoplasm of unspecified site of right female breast: Secondary | ICD-10-CM

## 2017-10-13 DIAGNOSIS — K219 Gastro-esophageal reflux disease without esophagitis: Secondary | ICD-10-CM | POA: Diagnosis not present

## 2017-10-13 DIAGNOSIS — Z800 Family history of malignant neoplasm of digestive organs: Secondary | ICD-10-CM

## 2017-10-13 DIAGNOSIS — R609 Edema, unspecified: Secondary | ICD-10-CM | POA: Diagnosis not present

## 2017-10-13 DIAGNOSIS — J9690 Respiratory failure, unspecified, unspecified whether with hypoxia or hypercapnia: Secondary | ICD-10-CM

## 2017-10-13 DIAGNOSIS — Z803 Family history of malignant neoplasm of breast: Secondary | ICD-10-CM

## 2017-10-13 DIAGNOSIS — Z9289 Personal history of other medical treatment: Secondary | ICD-10-CM

## 2017-10-13 DIAGNOSIS — I5030 Unspecified diastolic (congestive) heart failure: Secondary | ICD-10-CM | POA: Diagnosis not present

## 2017-10-13 DIAGNOSIS — R451 Restlessness and agitation: Secondary | ICD-10-CM

## 2017-10-13 DIAGNOSIS — R0600 Dyspnea, unspecified: Secondary | ICD-10-CM | POA: Diagnosis present

## 2017-10-13 DIAGNOSIS — Z789 Other specified health status: Secondary | ICD-10-CM

## 2017-10-13 DIAGNOSIS — Z91013 Allergy to seafood: Secondary | ICD-10-CM

## 2017-10-13 LAB — BASIC METABOLIC PANEL
Anion gap: 6 (ref 5–15)
BUN: 13 mg/dL (ref 8–23)
CALCIUM: 8 mg/dL — AB (ref 8.9–10.3)
CO2: 30 mmol/L (ref 22–32)
CREATININE: 0.7 mg/dL (ref 0.44–1.00)
Chloride: 104 mmol/L (ref 98–111)
GFR calc Af Amer: >60 mL/min (ref >60)
Glucose, Bld: 170 mg/dL — ABNORMAL HIGH (ref 70–99)
Potassium: 4.8 mmol/L (ref 3.5–5.1)
Sodium: 140 mmol/L (ref 135–145)

## 2017-10-13 LAB — TROPONIN I
Troponin I: <0.03 ng/mL (ref <0.03)
Troponin I: <0.03 ng/mL (ref <0.03)

## 2017-10-13 LAB — LACTIC ACID, PLASMA: LACTIC ACID, VENOUS: 1.2 mmol/L (ref 0.5–1.9)

## 2017-10-13 LAB — POCT I-STAT 3, ART BLOOD GAS (G3+)
Acid-Base Excess: 5 mmol/L — ABNORMAL HIGH (ref 0.0–2.0)
Acid-Base Excess: 4 mmol/L — ABNORMAL HIGH (ref 0.0–2.0)
BICARBONATE: 33.2 mmol/L — AB (ref 20.0–28.0)
Bicarbonate: 32.5 mmol/L — ABNORMAL HIGH (ref 20.0–28.0)
O2 SAT: 93 %
O2 Saturation: 91 %
PCO2 ART: 66.8 mmHg — AB (ref 32.0–48.0)
PH ART: 7.294 — AB (ref 7.350–7.450)
PO2 ART: 70 mmHg — AB (ref 83.0–108.0)
TCO2: 35 mmol/L — ABNORMAL HIGH (ref 22–32)
TCO2: 35 mmol/L — ABNORMAL HIGH (ref 22–32)
pCO2 arterial: 69.8 mmHg (ref 32.0–48.0)
pH, Arterial: 7.285 — ABNORMAL LOW (ref 7.350–7.450)
pO2, Arterial: 77 mmHg — ABNORMAL LOW (ref 83.0–108.0)

## 2017-10-13 LAB — GLUCOSE, CAPILLARY
GLUCOSE-CAPILLARY: 157 mg/dL — AB (ref 70–99)
Glucose-Capillary: 143 mg/dL — ABNORMAL HIGH (ref 70–99)
Glucose-Capillary: 166 mg/dL — ABNORMAL HIGH (ref 70–99)
Glucose-Capillary: 169 mg/dL — ABNORMAL HIGH (ref 70–99)

## 2017-10-13 LAB — PHOSPHORUS: Phosphorus: 1.2 mg/dL — ABNORMAL LOW (ref 2.5–4.6)

## 2017-10-13 LAB — HEPATIC FUNCTION PANEL
ALK PHOS: 85 U/L (ref 38–126)
ALT: 17 U/L (ref 0–44)
AST: 21 U/L (ref 15–41)
Albumin: 3.3 g/dL — ABNORMAL LOW (ref 3.5–5.0)
BILIRUBIN INDIRECT: 0.6 mg/dL (ref 0.3–0.9)
Bilirubin, Direct: 0.2 mg/dL (ref 0.0–0.2)
TOTAL PROTEIN: 6.8 g/dL (ref 6.5–8.1)
Total Bilirubin: 0.8 mg/dL (ref 0.3–1.2)

## 2017-10-13 LAB — MAGNESIUM: Magnesium: 2.3 mg/dL (ref 1.7–2.4)

## 2017-10-13 LAB — CBC WITH DIFFERENTIAL/PLATELET
Basophils Absolute: 0 10*3/uL (ref 0.0–0.1)
Basophils Relative: 0 %
EOS ABS: 0 10*3/uL (ref 0.0–0.7)
Eosinophils Relative: 0 %
HCT: 40.2 % (ref 36.0–46.0)
Hemoglobin: 11.3 g/dL — ABNORMAL LOW (ref 12.0–15.0)
LYMPHS PCT: 5 %
Lymphs Abs: 0.5 10*3/uL — ABNORMAL LOW (ref 0.7–4.0)
MCH: 31.7 pg (ref 26.0–34.0)
MCHC: 28.1 g/dL — AB (ref 30.0–36.0)
MCV: 112.9 fL — ABNORMAL HIGH (ref 78.0–100.0)
MONOS PCT: 2 %
Monocytes Absolute: 0.2 10*3/uL (ref 0.1–1.0)
NEUTROS ABS: 9.7 10*3/uL — AB (ref 1.7–7.7)
NEUTROS PCT: 93 %
PLATELETS: 216 10*3/uL (ref 150–400)
RBC: 3.56 MIL/uL — ABNORMAL LOW (ref 3.87–5.11)
RDW: 12.1 % (ref 11.5–15.5)
WBC: 10.4 10*3/uL (ref 4.0–10.5)

## 2017-10-13 LAB — URINALYSIS, ROUTINE W REFLEX MICROSCOPIC
BILIRUBIN URINE: NEGATIVE
Bacteria, UA: NONE SEEN
Glucose, UA: NEGATIVE mg/dL
KETONES UR: 20 mg/dL — AB
Leukocytes, UA: NEGATIVE
NITRITE: NEGATIVE
PH: 6 (ref 5.0–8.0)
Protein, ur: 30 mg/dL — AB
Specific Gravity, Urine: >1.046 — ABNORMAL HIGH (ref 1.005–1.030)

## 2017-10-13 LAB — MRSA PCR SCREENING: MRSA by PCR: NEGATIVE

## 2017-10-13 LAB — PROCALCITONIN: Procalcitonin: <0.1 ng/mL

## 2017-10-13 LAB — STREP PNEUMONIAE URINARY ANTIGEN: Strep Pneumo Urinary Antigen: NEGATIVE

## 2017-10-13 LAB — TRIGLYCERIDES: TRIGLYCERIDES: 67 mg/dL (ref <150)

## 2017-10-13 LAB — BRAIN NATRIURETIC PEPTIDE: B NATRIURETIC PEPTIDE 5: 39.4 pg/mL (ref 0.0–100.0)

## 2017-10-13 MED ORDER — METHYLPREDNISOLONE SODIUM SUCC 125 MG IJ SOLR
60.0000 mg | Freq: Four times a day (QID) | INTRAMUSCULAR | Status: DC
  Administered 2017-10-13 – 2017-10-14 (×5): 60 mg via INTRAVENOUS
  Filled 2017-10-13 (×5): qty 2

## 2017-10-13 MED ORDER — FAMOTIDINE 40 MG/5ML PO SUSR
20.0000 mg | Freq: Every day | ORAL | Status: DC
  Administered 2017-10-13 – 2017-10-22 (×10): 20 mg
  Filled 2017-10-13 (×11): qty 2.5

## 2017-10-13 MED ORDER — PROPOFOL 1000 MG/100ML IV EMUL
5.0000 ug/kg/min | INTRAVENOUS | Status: DC
  Administered 2017-10-13 (×2): 65 ug/kg/min via INTRAVENOUS
  Administered 2017-10-13: 5 ug/kg/min via INTRAVENOUS
  Administered 2017-10-13: 55 ug/kg/min via INTRAVENOUS
  Administered 2017-10-14 (×2): 65 ug/kg/min via INTRAVENOUS
  Administered 2017-10-14: 75 ug/kg/min via INTRAVENOUS
  Administered 2017-10-14: 65 ug/kg/min via INTRAVENOUS
  Administered 2017-10-14: 35 ug/kg/min via INTRAVENOUS
  Administered 2017-10-14: 75 ug/kg/min via INTRAVENOUS
  Administered 2017-10-14: 35 ug/kg/min via INTRAVENOUS
  Administered 2017-10-14: 75 ug/kg/min via INTRAVENOUS
  Administered 2017-10-15: 40 ug/kg/min via INTRAVENOUS
  Administered 2017-10-15: 35 ug/kg/min via INTRAVENOUS
  Filled 2017-10-13 (×13): qty 100

## 2017-10-13 MED ORDER — BUDESONIDE 0.5 MG/2ML IN SUSP
0.5000 mg | Freq: Two times a day (BID) | RESPIRATORY_TRACT | Status: DC
  Filled 2017-10-13 (×2): qty 2

## 2017-10-13 MED ORDER — FENTANYL BOLUS VIA INFUSION
25.0000 ug | INTRAVENOUS | Status: DC | PRN
  Filled 2017-10-13: qty 25

## 2017-10-13 MED ORDER — ANASTROZOLE 1 MG PO TABS
1.0000 mg | ORAL_TABLET | Freq: Every day | ORAL | Status: DC
  Administered 2017-10-13 – 2017-10-22 (×10): 1 mg
  Filled 2017-10-13 (×11): qty 1

## 2017-10-13 MED ORDER — MIDAZOLAM HCL 2 MG/2ML IJ SOLN
1.0000 mg | INTRAMUSCULAR | Status: DC | PRN
  Administered 2017-10-13 – 2017-10-14 (×2): 1 mg via INTRAVENOUS
  Filled 2017-10-13 (×2): qty 2

## 2017-10-13 MED ORDER — IPRATROPIUM-ALBUTEROL 0.5-2.5 (3) MG/3ML IN SOLN: 3.0000 mL | Freq: Four times a day (QID) | RESPIRATORY_TRACT | Status: DC

## 2017-10-13 MED ORDER — ATORVASTATIN CALCIUM 10 MG PO TABS: 10.0000 mg | ORAL_TABLET | Freq: Every day | ORAL | Status: DC

## 2017-10-13 MED ORDER — INSULIN ASPART 100 UNIT/ML ~~LOC~~ SOLN
2.0000 [IU] | SUBCUTANEOUS | Status: DC
  Administered 2017-10-13 (×3): 4 [IU] via SUBCUTANEOUS
  Administered 2017-10-13: 2 [IU] via SUBCUTANEOUS
  Administered 2017-10-14 (×5): 4 [IU] via SUBCUTANEOUS
  Administered 2017-10-14: 6 [IU] via SUBCUTANEOUS
  Administered 2017-10-14: 2 [IU] via SUBCUTANEOUS
  Administered 2017-10-15: 6 [IU] via SUBCUTANEOUS
  Administered 2017-10-15: 4 [IU] via SUBCUTANEOUS

## 2017-10-13 MED ORDER — VITAL HIGH PROTEIN PO LIQD
1000.0000 mL | ORAL | Status: DC
  Administered 2017-10-13 – 2017-10-15 (×3): 1000 mL

## 2017-10-13 MED ORDER — SODIUM CHLORIDE 0.9 % IV SOLN
1.0000 g | Freq: Two times a day (BID) | INTRAVENOUS | Status: DC
  Administered 2017-10-13: 1 g via INTRAVENOUS
  Filled 2017-10-13: qty 1

## 2017-10-13 MED ORDER — SODIUM CHLORIDE 0.9 % IV SOLN
20.00 | INTRAVENOUS | Status: DC
Start: ? — End: 2017-10-13

## 2017-10-13 MED ORDER — ANASTROZOLE 1 MG PO TABS: 1.0000 mg | ORAL_TABLET | Freq: Every day | ORAL | Status: DC

## 2017-10-13 MED ORDER — ARFORMOTEROL TARTRATE 15 MCG/2ML IN NEBU
15.0000 ug | INHALATION_SOLUTION | Freq: Two times a day (BID) | RESPIRATORY_TRACT | Status: DC
  Administered 2017-10-13: 15 ug via RESPIRATORY_TRACT
  Filled 2017-10-13: qty 2

## 2017-10-13 MED ORDER — PROPOFOL 100 MG/10ML IV EMUL
5.00 | INTRAVENOUS | Status: DC
Start: ? — End: 2017-10-13

## 2017-10-13 MED ORDER — LEVOFLOXACIN IN D5W 750 MG/150ML IV SOLN
750.0000 mg | Freq: Every day | INTRAVENOUS | Status: DC
  Filled 2017-10-13: qty 150

## 2017-10-13 MED ORDER — SODIUM CHLORIDE 0.9 % IV SOLN
2.0000 g | Freq: Three times a day (TID) | INTRAVENOUS | Status: DC
  Administered 2017-10-13 – 2017-10-15 (×6): 2 g via INTRAVENOUS
  Filled 2017-10-13 (×7): qty 2

## 2017-10-13 MED ORDER — CHLORHEXIDINE GLUCONATE 0.12% ORAL RINSE (MEDLINE KIT)
15.0000 mL | Freq: Two times a day (BID) | OROMUCOSAL | Status: DC
  Administered 2017-10-13 – 2017-10-22 (×19): 15 mL via OROMUCOSAL

## 2017-10-13 MED ORDER — FENTANYL 2500MCG IN NS 250ML (10MCG/ML) PREMIX INFUSION
25.0000 ug/h | INTRAVENOUS | Status: DC
  Administered 2017-10-13: 50 ug/h via INTRAVENOUS
  Filled 2017-10-13: qty 250

## 2017-10-13 MED ORDER — VANCOMYCIN HCL IN DEXTROSE 750-5 MG/150ML-% IV SOLN
750.0000 mg | Freq: Two times a day (BID) | INTRAVENOUS | Status: DC
  Administered 2017-10-13 – 2017-10-14 (×2): 750 mg via INTRAVENOUS
  Filled 2017-10-13 (×2): qty 150

## 2017-10-13 MED ORDER — LACTATED RINGERS IV SOLN
INTRAVENOUS | Status: DC
  Administered 2017-10-13: 09:00:00 via INTRAVENOUS

## 2017-10-13 MED ORDER — HEPARIN SODIUM (PORCINE) 5000 UNIT/ML IJ SOLN
5000.0000 [IU] | Freq: Three times a day (TID) | INTRAMUSCULAR | Status: DC
  Administered 2017-10-13: 5000 [IU] via SUBCUTANEOUS
  Filled 2017-10-13: qty 1

## 2017-10-13 MED ORDER — MIDAZOLAM HCL 2 MG/2ML IJ SOLN
1.0000 mg | INTRAMUSCULAR | Status: DC | PRN
  Administered 2017-10-14: 1 mg via INTRAVENOUS
  Filled 2017-10-13: qty 2

## 2017-10-13 MED ORDER — ORAL CARE MOUTH RINSE
15.0000 mL | OROMUCOSAL | Status: DC
  Administered 2017-10-13 – 2017-10-22 (×91): 15 mL via OROMUCOSAL

## 2017-10-13 MED ORDER — ATORVASTATIN CALCIUM 10 MG PO TABS
10.0000 mg | ORAL_TABLET | Freq: Every day | ORAL | Status: DC
  Administered 2017-10-13 – 2017-10-22 (×10): 10 mg
  Filled 2017-10-13 (×11): qty 1

## 2017-10-13 MED ORDER — PANTOPRAZOLE SODIUM 40 MG PO PACK
40.0000 mg | PACK | Freq: Every day | ORAL | Status: DC
  Filled 2017-10-13: qty 20

## 2017-10-13 MED ORDER — FENTANYL CITRATE (PF) 100 MCG/2ML IJ SOLN
50.0000 ug | Freq: Once | INTRAMUSCULAR | Status: AC
  Administered 2017-10-13: 50 ug via INTRAVENOUS
  Filled 2017-10-13: qty 2

## 2017-10-13 MED ORDER — ENOXAPARIN SODIUM 40 MG/0.4ML ~~LOC~~ SOLN
40.0000 mg | SUBCUTANEOUS | Status: DC
  Administered 2017-10-13 – 2017-10-16 (×4): 40 mg via SUBCUTANEOUS
  Filled 2017-10-13 (×5): qty 0.4

## 2017-10-13 MED ORDER — DEXTROSE 5 % IV SOLN
30.0000 mmol | Freq: Once | INTRAVENOUS | Status: AC
  Administered 2017-10-13: 30 mmol via INTRAVENOUS
  Filled 2017-10-13: qty 10

## 2017-10-13 MED ORDER — PRO-STAT SUGAR FREE PO LIQD
60.0000 mL | Freq: Two times a day (BID) | ORAL | Status: DC
  Administered 2017-10-13 – 2017-10-16 (×7): 60 mL
  Filled 2017-10-13 (×7): qty 60

## 2017-10-13 MED ORDER — IPRATROPIUM-ALBUTEROL 0.5-2.5 (3) MG/3ML IN SOLN
3.0000 mL | RESPIRATORY_TRACT | Status: DC
  Administered 2017-10-13 – 2017-10-15 (×11): 3 mL via RESPIRATORY_TRACT
  Filled 2017-10-13 (×9): qty 3

## 2017-10-13 MED ORDER — SODIUM CHLORIDE 0.9 % IV SOLN
250.0000 mL | INTRAVENOUS | Status: DC | PRN
  Administered 2017-10-15: 250 mL via INTRAVENOUS

## 2017-10-13 MED ORDER — ALBUTEROL SULFATE (2.5 MG/3ML) 0.083% IN NEBU
2.50 | INHALATION_SOLUTION | RESPIRATORY_TRACT | Status: DC
Start: ? — End: 2017-10-13

## 2017-10-13 MED ORDER — FENTANYL CITRATE (PF) 100 MCG/2ML IJ SOLN
50.0000 ug | INTRAMUSCULAR | Status: DC | PRN
  Administered 2017-10-13 (×2): 100 ug via INTRAVENOUS
  Administered 2017-10-14: 200 ug via INTRAVENOUS
  Administered 2017-10-14: 100 ug via INTRAVENOUS
  Administered 2017-10-14: 50 ug via INTRAVENOUS
  Filled 2017-10-13 (×5): qty 2

## 2017-10-13 NOTE — Progress Notes (Signed)
HISTORY & PHYSICAL  Patient Name: Ashley Conrad MRN: [id number] DOB: 10-Sep-1945    ADMISSION DATE:  10/05/2017 DATE OF SERVICE:  10/27/2017  CHIEF COMPLAINT: Acute hypoxemic and hypercapnic respiratory; loculated right pleural effusion   BRIEF  This 72 y.o. African-American female reformed smoker transferred to the [facility] Emergency Department via EMS from [facility] [place], [place]) with complaints of acute hypoxemic and hypercapnic respiratory failure and a loculated right pleural effusion.  I was contacted by Dr. Ardis Rowan at [facility] with a request to transfer the patient after she presented to [facility] emergency department with complaints of increasing dyspnea and increasing oxygen requirement.  She is a known COPD patient who uses to 5 LPM via nasal cannula at baseline.  Initially, the request was for higher level care in hopes of definitive treatment of a loculated right pleural effusion; however, during her ER stay at [facility], the patient clinically deteriorated and required endotracheal intubation and mechanical ventilatory support.  The patient was recently hospitalized (09/18/2017) at [facility], during which she underwent lumpectomy for ductal invasive carcinoma.  Apparently, the patient reported upon presentation to [facility] emergency department that she did not feel she had done well after her hospital discharge.  She initially presented with respiratory distress and oxygen saturation in the 70s on her 5 LPM via nasal cannula.  She also initially exhibited tachypnea and hypertension; however, as she deteriorated and required BiPAP support she became mildly hypotensive.  Trial of BiPAP therapy was initiated with IPAP 16, EPAP 10, FiO2 80%.  Follow-up ABGs demonstrated failure to improve her hypercapnic respiratory failure.  Conversation with Dr. Rozell Searing suggests that the patient was getting intravenous  Solu-Medrol along with aerosol therapies.  Further ER evaluation included chest x-ray, which raise concern for right-sided pleural effusion.  She had a CT angiogram of the chest which was negative for thromboembolic disease; however, the images were reported to be concerning for loculated pleural effusion.  The patient did transport with a CD, presumably containing CT images, which are being sent to radiology to be uploaded.  EVENTS  8/16: Presents to [facility] emergency department with complaints of increased dyspnea and increasing oxygen requirement.  Failed BiPAP support.  Intubated.  ER evaluation revealed a loculated right pleural effusion. 8/17: Transferred to [place] ICU afte rintubation   SUBJECTIVE/OVERNIGHT/INTERVAL HX 09/28/2017 - on vent. STill hypercapnic. On fent gtt 166mcg -> RASS -2 overnigt. Not on pressors. AFebrile. Making urine. Arrived in ICU few hours ago. No labs yet. On 100% fiow2/peep 5 -> pulse ox 95%    VITAL SIGNS: BP 122/81   Pulse 96   Temp 98.4 F (36.9 C) (Oral)   Resp (!) 23   Wt 79.2 kg   SpO2 94%   BMI 34.10 kg/m  Vent Mode: PRVC FiO2 (%):  [100 %] 100 % Set Rate:  [18 bmp-22 bmp] 22 bmp Vt Set:  [430 mL] 430 mL PEEP:  [5 cmH20] 5 cmH20 Plateau Pressure:  [27 cmH20-28 cmH20] 27 cmH20 INTAKE / OUTPUT: I/O last 3 completed shifts: In: 3.1 [I.V.:3.1] Out: 175 [Urine:175]  PHYSICAL EXAMINATION:  General Appearance:    Looks criticall ill OBESE - +  Head:    Normocephalic, without obvious abnormality, atraumatic  Eyes:    PERRL - yes, conjunctiva/corneas - clear      Ears:    Normal external ear canals, both ears  Nose:   NG tube -  no  Throat:  ETT TUBE - yes , OG tube - y  Neck:   Supple,  No enlargement/tenderness/nodules     Lungs:     Clear to auscultation bilaterally, Ventilator   Synchrony - yes  Chest wall:    No deformity  Heart:    S1 and S2 normal, no murmur, CVP - no.  Pressors - no  Abdomen:      Soft, no masses, no organomegaly  Genitalia:    Not done  Rectal:   not done  Extremities:   Extremities- intact     Skin:   Intact in exposed areas . Sacral area - intact per RN     Neurologic:   Sedation - fent gtt -> RASS - -2 . Moves all 4s - yes. CAM-ICU - not tested byuyt nodded appropriately . Orientation - na     LABS: Review of labs from [place] (9/62/2297 2119): Sodium 141, potassium 4.4, chloride 98, bicarbonate greater than 40, BUN 16, creatinine 0.6.  Glucose 131.  Calcium 8.7.  Protein 7.4.  Albumin 4.1.  Total bilirubin 0.4.  AST 29.  ALT 25.  Alkaline phosphatase 111.  CARDIAC ENZYMES (10/12/2017 2119): Troponin I less than 0.03.  Broke proBNP 120.  INR 1.1.  PTT 30.8.  D-dimer 510.  CBC (10/12/2017 2119): WBC 11.8 (73% neutrophils, 14% lymphocytes, 8% monocytes, 4% eosinophils, 1% basophils).  Hemoglobin 11.2, hematocrit 35.8.  MCV 97.5.  Platelets 264.  ABG (10/17/2017 0010): pH 7.18, PCO2 101, PO2 74 on BiPAP 16/10, FiO2 80% ABG (10/18/2017 0151): pH 7.10, PCO2 116, PO2 62 on AC 18/425/60/5  PULMONARY Recent Labs  Lab 10/27/2017 0513 10/19/2017 0613  PHART 7.285* 7.294*  PCO2ART 69.8* 66.8*  PO2ART 70.0* 77.0*  HCO3 33.2* 32.5*  TCO2 35* 35*  O2SAT 91.0 93.0    CBC No results for input(s): HGB, HCT, WBC, PLT in the last 168 hours.  COAGULATION No results for input(s): INR in the last 168 hours.  CARDIAC  No results for input(s): TROPONINI in the last 168 hours. No results for input(s): PROBNP in the last 168 hours.   CHEMISTRY No results for input(s): NA, K, CL, CO2, GLUCOSE, BUN, CREATININE, CALCIUM, MG, PHOS in the last 168 hours. CrCl cannot be calculated (Patient's most recent lab result is older than the maximum 21 days allowed.).   LIVER No results for input(s): AST, ALT, ALKPHOS, BILITOT, PROT, ALBUMIN, INR in the last 168 hours.   INFECTIOUS No results for input(s): LATICACIDVEN, PROCALCITON in the  last 168 hours.   ENDOCRINE CBG (last 3)  No results for input(s): GLUCAP in the last 72 hours.       IMAGING x48h  - image(s) personally visualized  -   highlighted in bold Dg Chest Port 1 View  Result Date: 10/10/2017 CLINICAL DATA:  Ventilator dependent. History of COPD and hypertension. EXAM: PORTABLE CHEST 1 VIEW COMPARISON:  09/19/2017 FINDINGS: Endotracheal tube placed with tip measuring 2.6 cm above the carina. Enteric tube tip is not visualized due to under penetration. Location cannot be confirmed. Cardiac enlargement. Pulmonary vascular congestion. Bilateral pleural effusions with atelectasis or consolidation in both lung bases, greater on the right, and progressing since previous study. No pneumothorax. IMPRESSION: Endotracheal tube with tip above the carina. Enteric tube tip is not visualized due to technique and position cannot be confirmed. Cardiac enlargement with pulmonary vascular congestion. Increasing bilateral pleural effusions and basilar atelectasis or consolidation, greater on the right. Electronically Signed  By: Lucienne Capers M.D.   On: 09/27/2017 05:36     ASSESSMENT / PLAN: Principal Problem:   Acute on chronic respiratory failure with hypoxia and hypercapnia (HCC) Active Problems:   Breast cancer (HCC)   COPD exacerbation (HCC)   Loculated pleural effusion   Acute on chronic respiratory failure with hypoxia and hypercapnia (HCC) ABG with hypercapnia indicates not ready for extubation  Plan Full vent support   COPD exacerbation (HCC) No wheezing currently due to steroid and nebs  Plan contniue solumedrol Continue bD Check RVP  Loculated pleural effusion On right side present prior to admit  Plan Get CT chest from OSH on PACS to decide chest tube v surgery v obs Cotniue abx as below for now  Anti-infectives (From admission, onward)   Start     Dose/Rate Route Frequency Ordered Stop   09/28/2017 2000  vancomycin (VANCOCIN) IVPB 750  mg/150 ml premix     750 mg 150 mL/hr over 60 Minutes Intravenous Every 12 hours 10/03/2017 0645     10/06/2017 1000  ceFEPIme (MAXIPIME) 1 g in sodium chloride 0.9 % 100 mL IVPB     1 g 200 mL/hr over 30 Minutes Intravenous Every 12 hours 10/21/2017 0645     10/08/2017 0800  levofloxacin (LEVAQUIN) IVPB 750 mg  Status:  Discontinued     750 mg 100 mL/hr over 90 Minutes Intravenous Daily 10/22/2017 0645 10/02/2017 0749       Agitation requiring sedation protocol Currently cooperative  Plan Change fent gtt to diprivan gtt RASS goal 0 to -2 and vent synchrony  Malignant neoplasm of upper-outer quadrant of right breast in female, estrogen receptor positive ([place]) opd issue    Hypertension Currently bp ok  Plan moniotor  No contraindication to deep vein thrombosis (DVT) prophylaxis Given cancer hx  Plan Change heparin to lovenox  At risk for stress ulcer h2 blockade  At risk for hyperglycemia icu hyperglycemia protocol     FAMILY  - Updates: 09/29/2017 --> no family at bedside  - Inter-disciplinary family meet or Palliative Care meeting due by:  DAy 7. Current LOS is LOS 0 days   DISPO Keep in ICU    The patient is critically ill with multiple organ systems failure and requires high complexity decision making for assessment and support, frequent evaluation and titration of therapies, application of advanced monitoring technologies and extensive interpretation of multiple databases.   Critical Care Time devoted to patient care services described in this note is  30  Minutes. This time reflects time of care of this signee Dr Brand Males. This critical care time does not reflect procedure time, or teaching time or supervisory time of PA/NP/Med student/Med Resident etc but could involve care discussion time    Dr. Brand Males, M.D., [facility].C.P Pulmonary and Critical Care Medicine Staff Physician [place] [facility] Pager: [phone], If no answer or between  15:00h - 7:00h: call [phone]  10/12/2017 8:05 AM

## 2017-10-13 NOTE — Assessment & Plan Note (Signed)
Currently bp ok  Plan moniotor

## 2017-10-13 NOTE — Assessment & Plan Note (Addendum)
ABG with hypercapnia indicates not ready for extubation but also worsening fio2/peep needs 10/14/2017. CXR with possible volume overload  Plan Full vent support Start lasix

## 2017-10-13 NOTE — Progress Notes (Signed)
Pharmacy Antibiotic Note  Ashley Conrad is a 72 y.o. female admitted on 10/22/2017 with VDRF, COPD exacerbation, possible HCAP.  Pharmacy has been consulted for Vancomycin and Cefepime. Levaquin d/c'd. SCr 0.7 on admit, CrCl~55-60.  Plan: Vancomycin 750 mg IV q12h Increase cefepime to 2g IV q8h Monitor clinical progress, c/s, renal function F/u de-escalation plan/LOT, vancomycin trough as indicated   Weight: 174 lb 9.7 oz (79.2 kg)  Temp (24hrs), Avg:98.4 F (36.9 C), Min:98.2 F (36.8 C), Max:98.6 F (37 C)  Recent Labs  Lab 10/14/2017 0658  WBC 10.4  CREATININE 0.70  LATICACIDVEN 1.2    Estimated Creatinine Clearance: 57.5 mL/min (by C-G formula based on SCr of 0.7 mg/dL).    Allergies  Allergen Reactions  . Cyclobenzaprine Other (See Comments)    Cognitive issues.  Marland Kitchen Shellfish Allergy Itching    Itching   Elicia Lamp, PharmD, BCPS Clinical Pharmacist Clinical phone [phone] Please check AMION for all [place] contact numbers 10/12/2017 12:02 PM

## 2017-10-13 NOTE — Assessment & Plan Note (Addendum)
Doing well on diprivan gtt  Plan Diprivan gtt RASS goal 0 to -2 and vent synchrony

## 2017-10-13 NOTE — Assessment & Plan Note (Signed)
h2 blockade

## 2017-10-13 NOTE — Progress Notes (Signed)
Verbal order to obtain a 6:00 ABG. Values given to MD. Vent changes made at this time.

## 2017-10-13 NOTE — Assessment & Plan Note (Signed)
icu hyperglycemia protocol

## 2017-10-13 NOTE — H&P (Signed)
HISTORY & PHYSICAL  Patient Name: Ashley Conrad MRN: [id number] DOB: 1945-12-02    ADMISSION DATE:  10/12/2017 DATE OF SERVICE:  10/27/2017  CHIEF COMPLAINT: Acute hypoxemic and hypercapnic respiratory; loculated right pleural effusion   HISTORY OF PRESENT ILLNESS  This 72 y.o. African-American female reformed smoker transferred to the [facility] Emergency Department via EMS from [facility] [place], [place]) with complaints of acute hypoxemic and hypercapnic respiratory failure and a loculated right pleural effusion.  I was contacted by Dr. Ardis Rowan at [facility] with a request to transfer the patient after she presented to [facility] emergency department with complaints of increasing dyspnea and increasing oxygen requirement.  She is a known COPD patient who uses to 5 LPM via nasal cannula at baseline.  Initially, the request was for higher level care in hopes of definitive treatment of a loculated right pleural effusion; however, during her ER stay at [facility], the patient clinically deteriorated and required endotracheal intubation and mechanical ventilatory support.  The patient was recently hospitalized (09/18/2017) at [facility], during which she underwent lumpectomy for ductal invasive carcinoma.  Apparently, the patient reported upon presentation to [facility] emergency department that she did not feel she had done well after her hospital discharge.  She initially presented with respiratory distress and oxygen saturation in the 70s on her 5 LPM via nasal cannula.  She also initially exhibited tachypnea and hypertension; however, as she deteriorated and required BiPAP support she became mildly hypotensive.  Trial of BiPAP therapy was initiated with IPAP 16, EPAP 10, FiO2 80%.  Follow-up ABGs demonstrated failure to improve her hypercapnic respiratory failure.  Conversation with Dr. Rozell Searing suggests that the patient  was getting intravenous Solu-Medrol along with aerosol therapies.  Further ER evaluation included chest x-ray, which raise concern for right-sided pleural effusion.  She had a CT angiogram of the chest which was negative for thromboembolic disease; however, the images were reported to be concerning for loculated pleural effusion.  The patient did transport with a CD, presumably containing CT images, which are being sent to radiology to be uploaded.  REVIEW OF SYSTEMS This patient is critically ill and cannot provide additional history nor review of systems due to Ventilated and Unable to speak.   PAST MEDICAL/SURGICAL/SOCIAL/FAMILY HISTORIES   Past Medical History:  Diagnosis Date  . Cancer (HCC)    BREAST  . COPD (chronic obstructive pulmonary disease) ([place])   . Dyspnea   . Family history of breast cancer   . Family history of prostate cancer   . Family history of stomach cancer   . GERD (gastroesophageal reflux disease)   . Headache   . Heart murmur    AGE  53    . History of right breast cancer   . Hypertension   . Oxygen dependent    4 LITERS   . Personal history of breast cancer 08/15/2017    Past Surgical History:  Procedure Laterality Date  . APPENDECTOMY    . BREAST LUMPECTOMY WITH RADIOACTIVE SEED LOCALIZATION Right 09/18/2017   Procedure: RIGHT BREAST PARTIAL MASTECTOMY WITH RADIOACTIVE SEED LOCALIZATION;  Surgeon: Excell Seltzer, MD;  Location: [facility];  Service: General;  Laterality: Right;  . CATARACT EXTRACTION, BILATERAL    . MASTECTOMY, PARTIAL Right 09/18/2017  . right lumpectomy    . TONSILECTOMY, ADENOIDECTOMY, BILATERAL MYRINGOTOMY AND TUBES      Social History   Tobacco Use  . Smoking status: Former Smoker  Last attempt to quit: 08/08/2012    Years since quitting: 5.1  . Smokeless tobacco: Never Used  Substance Use Topics  . Alcohol use: Not Currently    Family History  Problem Relation Age of Onset  . Breast cancer Mother 75  . Prostate  cancer Father 32       had radiation  . Prostate cancer Brother 43       has surgery  . Stomach cancer Paternal Aunt        early 57's  . Diabetes Maternal Grandmother   . Stomach cancer Paternal Aunt        early 68's    Allergies  Allergen Reactions  . Cyclobenzaprine Other (See Comments)    Cognitive issues.  . Shellfish Allergy Itching    Itching    Prior to Admission medications   Medication Sig Start Date End Date Taking? Authorizing Provider  acetaminophen (TYLENOL) 500 MG tablet Take 1,000 mg by mouth every 6 (six) hours as needed.    [provider]  albuterol (PROVENTIL HFA;VENTOLIN HFA) 108 (90 Base) MCG/ACT inhaler Inhale 2 puffs into the lungs 2 (two) times daily as needed for wheezing or shortness of breath.    [provider]  anastrozole (ARIMIDEX) 1 MG tablet Take 1 tablet (1 mg total) by mouth daily. 10/12/17   Magrinat, Virgie Dad, MD  atorvastatin (LIPITOR) 10 MG tablet Take 10 mg by mouth daily.    [provider]  budesonide-formoterol (SYMBICORT) 160-4.5 MCG/ACT inhaler Inhale 2 puffs into the lungs 2 (two) times daily.    [provider]  cetirizine (ZYRTEC) 10 MG tablet Take 10 mg by mouth daily.    [provider]  denosumab (PROLIA) 60 MG/ML SOSY injection Inject 60 mg into the skin every 6 (six) months.    [provider]  diltiazem (TIAZAC) 240 MG 24 hr capsule Take 240 mg by mouth daily.    [provider]  DULoxetine (CYMBALTA) 30 MG capsule Take 30 mg by mouth daily.    [provider]  Ergocalciferol (VITAMIN D2 PO) Take 50,000 Units by mouth every 30 (thirty) days.     [provider]  fluticasone (FLONASE) 50 MCG/ACT nasal spray Place 1 spray into both nostrils daily as needed (for allergies.).     [provider]  gabapentin (NEURONTIN) 300 MG capsule Take 300-600 mg by mouth 3 (three) times daily as needed. For pain. 08/29/17   [provider]  Magnesium  Oxide 400 (240 Mg) MG TABS Take 240 mg by mouth daily.    [provider]  montelukast (SINGULAIR) 10 MG tablet Take 10 mg by mouth at bedtime.    [provider]  omeprazole (PRILOSEC) 20 MG capsule Take 20 mg by mouth daily before breakfast.     [provider]  SPIRIVA RESPIMAT 2.5 MCG/ACT AERS Inhale 1 puff into the lungs daily. 08/13/17   [provider]    Current Facility-Administered Medications  Medication Dose Route Frequency Provider Last Rate Last Dose  . 0.9 %  sodium chloride infusion  250 mL Intravenous PRN Omar Person, NP      . arformoterol [facility]) nebulizer solution 15 mcg  15 mcg Nebulization BID Hayden Pedro M, NP      . budesonide (PULMICORT) nebulizer solution 0.5 mg  0.5 mg Nebulization BID Hayden Pedro M, NP      . fentaNYL (SUBLIMAZE) bolus via infusion 25 mcg  25 mcg Intravenous Q1H PRN Hayden Pedro  M, NP      . fentaNYL (SUBLIMAZE) injection 50 mcg  50 mcg Intravenous Once Omar Person, NP      . fentaNYL 2539mg in NS 255m(1048mml) infusion-PREMIX  25-400 mcg/hr Intravenous Continuous EubOmar PersonP      . heparin injection 5,000 Units  5,000 Units Subcutaneous Q8H Eubanks, Katalina M, NP      . ipratropium-albuterol (DUONEB) 0.5-2.5 (3) MG/3ML nebulizer solution 3 mL  3 mL Nebulization Q6H EubOmar PersonP      . midazolam (VERSED) injection 1 mg  1 mg Intravenous Q15 min PRN EubOmar PersonP      . midazolam (VERSED) injection 1 mg  1 mg Intravenous Q2H PRN EubOmar PersonP      . pantoprazole sodium (PROTONIX) 40 mg/20 mL oral suspension 40 mg  40 mg Per Tube Daily EubOmar PersonP        VITAL SIGNS: BP 126/83 (BP Location: Left Arm)   Pulse (!) 104   Temp 98.2 F (36.8 C) (Oral)   Resp 16   SpO2 92%   VENTILATOR SETTINGS: At the bedside, during initial evaluation, ventilator settings were changed to PRVC rate 18, tidal volume 430, FiO2 100%, PEEP 5.   I time was adjusted from 0.7 seconds to 1.2 seconds.  ABG was obtained prior to these adjustments.   INTAKE / OUTPUT: No intake/output data recorded.  PHYSICAL EXAMINATION: GENERAL: Intubated.  Sedated.  Well-developed.  No acute distress. HEAD: normocephalic, atraumatic EYE: PERRLA, EOM intact, no scleral icterus, no pallor. NOSE: nares are patent. No exudate. THROAT/ORAL CAVITY: Edentulous. No exudate. Mucous membranes are moist.  NECK: supple, no thyromegaly, no JVD, no lymphadenopathy. Trachea midline. CHEST/LUNG: symmetric in development and expansion. Good air entry.  Minutes breath sounds in the right hemithorax.  No crackles. No wheezes. HEART: Regular S1 and S2 without murmur, rub or gallop.  Tachycardia. ABDOMEN: soft, nontender, nondistended. Normoactive bowel sounds. No rebound. No guarding. No hepatosplenomegaly. EXTREMITIES: Edema: none. No cyanosis.  No clubbing. 2+ DP pulses LYMPHATIC: no cervical/axillary/inguinal lymph nodes appreciated MUSCULOSKELETAL: no joint tenderness, deformity or swelling. SKIN: No rash or lesion. NEUROLOGIC: Debated and sedated.  Doll's eyes intact. Corneal reflex intact. Spontaneous respirations intact. Cranial nerves II-XII are grossly symmetric and physiologic. Babinski absent. Gait was not assessed.   LABS: Review of labs from [facility]/[id number]): Sodium 141, potassium 4.4, chloride 98, bicarbonate greater than 40, BUN 16, creatinine 0.6.  Glucose 131.  Calcium 8.7.  Protein 7.4.  Albumin 4.1.  Total bilirubin 0.4.  AST 29.  ALT 25.  Alkaline phosphatase 111.  CARDIAC ENZYMES (10/12/2017 2119): Troponin I less than 0.03.  Broke proBNP 120.  INR 1.1.  PTT 30.8.  D-dimer 510.  CBC (10/12/2017 2119): WBC 11.8 (73% neutrophils, 14% lymphocytes, 8% monocytes, 4% eosinophils, 1% basophils).  Hemoglobin 11.2, hematocrit 35.8.  MCV 97.5.  Platelets 264.  ABG (09/27/2017 0010): pH 7.18, PCO2 101, PO2 74 on  BiPAP 16/10, FiO2 80% ABG (10/25/2017 0151): pH 7.10, PCO2 116, PO2 62 on AC 18/425/60/5   ABG Recent Labs  Lab 10/16/2017 0513  PHART 7.285*  PCO2ART 69.8*  PO2ART 70.0*    Cardiac Enzymes No results for input(s): TROPONINI, PROBNP in the last 168 hours.  Imaging No results found.   STUDIES:  [facility]17/2019 0140): An endotracheal tube with the tip at the origin of the right mainstem bronchus.  Cardiac  enlargement with mild vascular congestion.  Bilateral pleural effusions.  Atelectasis or infiltrate in the right mid and lower lungs and in the left lung base.  Pneumothorax.  CTA chest report from [facility] (10/12/2017 2315): Limited evaluation for distal emboli secondary to poor distal vessel contrast opacification and respiratory motion.  Moderate large loculated right pleural effusion with compressive atelectasis in the right middle, upper and lower lobes.  Severe emphysema.  New oval density within the lateral aspect of the right breast open (possibly related to the history of recent lumpectomy).   SIGNIFICANT EVENTS: 8/16: Presents to [facility] emergency department with complaints of increased dyspnea and increasing oxygen requirement.  Failed BiPAP support.  Intubated.  ER evaluation revealed a loculated right pleural effusion. 8/17: Transferred to [place] ICU.  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  Sedation with propofol/fentanyl. Albuterol/ipratropium every 4 hours scheduled.  On Symbicort 160/4.5 and Spiriva for maintenance therapy of COPD at home.  Also takes Singulair.   Solu-Medrol 60 mg IV every 6  hours. Will address definitive management of her right pleural effusion once she has stabilized on mechanical ventilatory support.  Will need to review chest CT images once uploaded into PACS.   CARDIOVASCULAR  Hypertension, currently normotensive Will wait to restart antihypertensive regimen at this time.    GASTROINTESTINAL  No acute issues GI prophylaxis: Famotidine   HEMATOLOGIC/ONCOLOGIC  Breast cancer: Invasive ductal carcinoma (pathology report 09/18/2017: Grade 2, ER positive, PR positive, HER-2 negative, Ki-67 negative.  TNM: pT1c, NX, MX) Continue Arimidex. DVT prophylaxis: Heparin   INFECTIOUS  No acute issues In light of her recent hospitalization, she will require empiric coverage for nosocomial infections.  Check procalcitonin.  If negative, low threshold to discontinue all antibiotic therapy. Empiric cefepime/Levaquin/vancomycin.   RENAL  No acute issues  ENDOCRINE  No acute issues   NEUROLOGIC  No acute issues   Admit to ICU under my service (Attending: Renee Pain, MD) with the diagnoses highlighted above in the active Hospital Problem List (ASSESSMENT).   FAMILY  - Updates: Family at the bedside updated  - Inter-disciplinary family meet or Palliative Care meeting due by:  10/19/2017  Critical care time: 60 minutes.  The treatment and management of the patient's condition was required based on the threat of imminent deterioration. This time reflects time spent by the physician evaluating, providing care and managing the critically ill patient's care. The time was spent at the immediate bedside (or on the same floor/unit and dedicated to this patient's care). Time involved in separately billable procedures is NOT included int he critical care time indicated above. Family meeting and update time may be included above if and only if the patient is unable/incompetent to participate in clinical interview and/or decision making, and the discussion was  necessary to determining treatment decisions.   Renee Pain, MD Board Certified by the ABIM, [place] Pager: [phone]  10/01/2017, 6:10 AM

## 2017-10-13 NOTE — Assessment & Plan Note (Addendum)
On right side present prior to admit. She is non-toxic and doubt this is loculated empyema. PCT < 0.1  Plan Get CT chest from  [place] on PACS to decide chest tube v surgery v obs Monitor PCT Abx as below - dc vanc Low threshold to DC all abx 10/15/17 depends on CT and PCT profile  Anti-infectives (From admission, onward)   Start     Dose/Rate Route Frequency Ordered Stop   10/22/2017 2000  vancomycin (VANCOCIN) IVPB 750 mg/150 ml premix  Status:  Discontinued     750 mg 150 mL/hr over 60 Minutes Intravenous Every 12 hours 10/24/2017 0645 10/14/17 0828   10/24/2017 1400  ceFEPIme (MAXIPIME) 2 g in sodium chloride 0.9 % 100 mL IVPB     2 g 200 mL/hr over 30 Minutes Intravenous Every 8 hours 10/09/2017 1203     10/26/2017 1000  ceFEPIme (MAXIPIME) 1 g in sodium chloride 0.9 % 100 mL IVPB  Status:  Discontinued     1 g 200 mL/hr over 30 Minutes Intravenous Every 12 hours 10/25/2017 0645 10/12/2017 1203   10/04/2017 0800  levofloxacin (LEVAQUIN) IVPB 750 mg  Status:  Discontinued     750 mg 100 mL/hr over 90 Minutes Intravenous Daily 10/03/2017 0645 10/18/2017 0749

## 2017-10-13 NOTE — Progress Notes (Addendum)
Pharmacy Antibiotic Note  Ashley Conrad is a 72 y.o. female admitted on 09/28/2017 with VDRF, possible HCAP.  Pharmacy has been consulted for Vancomycin and Cefepime and Levaquin dosing.  SCr 0.6.   Vancomycin 2 g IV given at [facility] [place]: Vancomycin 750 mg IV q12h Cefepime 1 g IV q12h Levaquin 750 mg IV q24h  Weight: 174 lb 9.7 oz (79.2 kg)  Temp (24hrs), Avg:98 F (36.7 C), Min:97.8 F (36.6 C), Max:98.2 F (36.8 C)  No results for input(s): WBC, CREATININE, LATICACIDVEN, VANCOTROUGH, VANCOPEAK, VANCORANDOM, GENTTROUGH, GENTPEAK, GENTRANDOM, TOBRATROUGH, TOBRAPEAK, TOBRARND, AMIKACINPEAK, AMIKACINTROU, AMIKACIN in the last 168 hours.  CrCl cannot be calculated (Patient's most recent lab result is older than the maximum 21 days allowed.).    Allergies  Allergen Reactions  . Cyclobenzaprine Other (See Comments)    Cognitive issues.  Marland Kitchen Shellfish Allergy Itching    Itching    Caryl Pina 10/01/2017 6:38 AM

## 2017-10-13 NOTE — Progress Notes (Signed)
Initial Nutrition Assessment  DOCUMENTATION CODES:  Obesity unspecified  INTERVENTION:  On high rate Diprivan. To reduce degree of overfeeding, Will begin trophic feed w/ additional of protein modulars.   Initiate TF via OGT with VHP at goal rate of 15 ml/h (360 ml per day) and Prostat 60 ml BID to provide 760 kcals (+565 from diprivan), 92 gm protein, 301 ml free water daily.  NUTRITION DIAGNOSIS:  Inadequate oral intake related to inability to eat as evidenced by NPO status.  GOAL:  Provide needs based on ASPEN/SCCM guidelines  MONITOR:  Vent status, Labs, Weight trends, I & O's, Diet advancement, TF tolerance  REASON FOR ASSESSMENT:  Consult, Ventilator Enteral/tube feeding initiation and management  ASSESSMENT:  72 y/o female PMHx Breast Cancer, HTN GERD, COPD (5L at baseline). Transferred to [facility] from OSH w/ respiratory failure and R pleural effusion. Had failed BiPAP support and required intubation. RD consulted for nutrition support.   Patient intubated, sedated. Family at bedside. They report the patient was at her baseline up until this past Monday and progressively became more SOB. Her intake was <50% of her normal since that time and worsened as week went on, only eating 2 packets of saltines the day before presentation At baseline, she does not take a MVI or follow any type of therapeutic diet. She did not require laxative/softeners.   Weight wise, family reports a UBW of 167-170 lbs. Per chart patient was 172.7 lbs in June. She presented this admission at 171.5 lbs.  Per review of Care Everywhere, patient appears to have maintained weight over last 6 months and has gradually gained over the past year.  Last year at this time was 167 lbs  Patient is currently intubated on ventilator support MV: 9.5 L/min Temp (24hrs), Avg:98.3 F (36.8 C), Min:98.2 F (36.8 C), Max:98.4 F (36.9 C) Propofol: 21.4 ml/hr-565 kcals/day  Physical Exam: WDL. No edema. Abdomen is soft/non  distended  Labs: Albumin:3.3, Phos:1.2, Glu:170,  Meds: IV Methylprednisolone, IV abx  Sedation/analgesia: Propofol  Recent Labs  Lab 10/07/2017 0658  NA 140  K 4.8  CL 104  CO2 30  BUN 13  CREATININE 0.70  CALCIUM 8.0*  MG 2.3  PHOS 1.2*  GLUCOSE 170*   NUTRITION - FOCUSED PHYSICAL EXAM:   Most Recent Value  Orbital Region  No depletion  Upper Arm Region  No depletion  Thoracic and Lumbar Region  No depletion  Buccal Region  No depletion  Temple Region  No depletion  Clavicle Bone Region  No depletion  Clavicle and Acromion Bone Region  No depletion  Scapular Bone Region  No depletion  Dorsal Hand  No depletion  Patellar Region  No depletion  Anterior Thigh Region  No depletion  Posterior Calf Region  No depletion  Edema (RD Assessment)  None  Hair  Reviewed  Eyes  Reviewed  Mouth  Reviewed  Skin  Reviewed  Nails  Reviewed     Diet Order:   Diet Order    None     EDUCATION NEEDS:  No education needs have been identified at this time  Skin:  Skin Assessment: Reviewed RN Assessment  Last BM:  Unknown  Height:  Ht Readings from Last 1 Encounters:  10/12/17 5' (1.524 m)   Weight:  Wt Readings from Last 1 Encounters:  10/01/2017 79.2 kg   Wt Readings from Last 10 Encounters:  10/15/2017 79.2 kg  10/12/17 77.8 kg  08/08/17 78.3 kg   Ideal Body Weight:  45.45  kg  BMI:  Body mass index is 34.1 kg/m.  Dosing weight 77.95 kg Estimated Nutritional Needs:  Kcal:  [phone] (11-14 kcal/kg bw) Protein:  >91g Pro (2g/kg ibw) Fluid:  Per MD goals  Burtis Junes RD, LDN, CNSC Clinical Nutrition Available Tues-Sat via Pager: [phone] 10/02/2017 11:17 AM

## 2017-10-13 NOTE — Assessment & Plan Note (Addendum)
Given cancer hx  Plan Sq lovenox

## 2017-10-13 NOTE — Assessment & Plan Note (Addendum)
No wheezing currently due to steroid and nebs  Plan contniue solumedrol but reduce dose Continue bD Await RVP From 10/08/2017

## 2017-10-13 NOTE — Assessment & Plan Note (Addendum)
opd issue Continue arimidex opd med

## 2017-10-14 ENCOUNTER — Inpatient Hospital Stay (HOSPITAL_COMMUNITY): Payer: Medicare Other

## 2017-10-14 ENCOUNTER — Inpatient Hospital Stay: Payer: Self-pay

## 2017-10-14 ENCOUNTER — Encounter (HOSPITAL_COMMUNITY): Payer: Self-pay

## 2017-10-14 ENCOUNTER — Other Ambulatory Visit: Payer: Self-pay

## 2017-10-14 DIAGNOSIS — J9621 Acute and chronic respiratory failure with hypoxia: Secondary | ICD-10-CM

## 2017-10-14 DIAGNOSIS — J9622 Acute and chronic respiratory failure with hypercapnia: Secondary | ICD-10-CM

## 2017-10-14 DIAGNOSIS — Z8679 Personal history of other diseases of the circulatory system: Secondary | ICD-10-CM

## 2017-10-14 LAB — RESPIRATORY PANEL BY PCR
Adenovirus: NOT DETECTED
Bordetella pertussis: NOT DETECTED
CORONAVIRUS OC43-RVPPCR: NOT DETECTED
Chlamydophila pneumoniae: NOT DETECTED
Coronavirus 229E: NOT DETECTED
Coronavirus HKU1: NOT DETECTED
Coronavirus NL63: NOT DETECTED
INFLUENZA A-RVPPCR: NOT DETECTED
INFLUENZA B-RVPPCR: NOT DETECTED
METAPNEUMOVIRUS-RVPPCR: NOT DETECTED
MYCOPLASMA PNEUMONIAE-RVPPCR: NOT DETECTED
PARAINFLUENZA VIRUS 1-RVPPCR: NOT DETECTED
PARAINFLUENZA VIRUS 2-RVPPCR: NOT DETECTED
PARAINFLUENZA VIRUS 4-RVPPCR: NOT DETECTED
Parainfluenza Virus 3: NOT DETECTED
RESPIRATORY SYNCYTIAL VIRUS-RVPPCR: NOT DETECTED
Rhinovirus / Enterovirus: NOT DETECTED

## 2017-10-14 LAB — BLOOD GAS, ARTERIAL
ACID-BASE EXCESS: 1.5 mmol/L (ref 0.0–2.0)
BICARBONATE: 29 mmol/L — AB (ref 20.0–28.0)
Drawn by: 365271
FIO2: 100
LHR: 13 {breaths}/min
O2 Saturation: 92.5 %
PEEP/CPAP: 8 cmH2O
Patient temperature: 98.6
VT: 430 mL
pCO2 arterial: 79.3 mmHg (ref 32.0–48.0)
pH, Arterial: 7.189 — CL (ref 7.350–7.450)
pO2, Arterial: 76.7 mmHg — ABNORMAL LOW (ref 83.0–108.0)

## 2017-10-14 LAB — POCT I-STAT 3, ART BLOOD GAS (G3+)
ACID-BASE EXCESS: 3 mmol/L — AB (ref 0.0–2.0)
BICARBONATE: 32.3 mmol/L — AB (ref 20.0–28.0)
O2 Saturation: 94 %
PO2 ART: 88 mmHg (ref 83.0–108.0)
Patient temperature: 98.6
TCO2: 35 mmol/L — ABNORMAL HIGH (ref 22–32)
pCO2 arterial: 75.2 mmHg (ref 32.0–48.0)
pH, Arterial: 7.241 — ABNORMAL LOW (ref 7.350–7.450)

## 2017-10-14 LAB — MAGNESIUM: Magnesium: 2.2 mg/dL (ref 1.7–2.4)

## 2017-10-14 LAB — CBC
HEMATOCRIT: 34.7 % — AB (ref 36.0–46.0)
HEMOGLOBIN: 9.6 g/dL — AB (ref 12.0–15.0)
MCH: 30.6 pg (ref 26.0–34.0)
MCHC: 27.7 g/dL — ABNORMAL LOW (ref 30.0–36.0)
MCV: 110.5 fL — AB (ref 78.0–100.0)
Platelets: 223 10*3/uL (ref 150–400)
RBC: 3.14 MIL/uL — ABNORMAL LOW (ref 3.87–5.11)
RDW: 12.6 % (ref 11.5–15.5)
WBC: 11.6 10*3/uL — ABNORMAL HIGH (ref 4.0–10.5)

## 2017-10-14 LAB — ALBUMIN: ALBUMIN: 3.2 g/dL — AB (ref 3.5–5.0)

## 2017-10-14 LAB — GLUCOSE, CAPILLARY
GLUCOSE-CAPILLARY: 146 mg/dL — AB (ref 70–99)
GLUCOSE-CAPILLARY: 158 mg/dL — AB (ref 70–99)
GLUCOSE-CAPILLARY: 166 mg/dL — AB (ref 70–99)
GLUCOSE-CAPILLARY: 187 mg/dL — AB (ref 70–99)
Glucose-Capillary: 169 mg/dL — ABNORMAL HIGH (ref 70–99)
Glucose-Capillary: 174 mg/dL — ABNORMAL HIGH (ref 70–99)
Glucose-Capillary: 208 mg/dL — ABNORMAL HIGH (ref 70–99)

## 2017-10-14 LAB — PHOSPHORUS: Phosphorus: 2.9 mg/dL (ref 2.5–4.6)

## 2017-10-14 LAB — LEGIONELLA PNEUMOPHILA SEROGP 1 UR AG: L. pneumophila Serogp 1 Ur Ag: NEGATIVE

## 2017-10-14 LAB — PROCALCITONIN: Procalcitonin: <0.1 ng/mL

## 2017-10-14 MED ORDER — FENTANYL 2500MCG IN NS 250ML (10MCG/ML) PREMIX INFUSION
25.0000 ug/h | INTRAVENOUS | Status: DC
  Administered 2017-10-14: 50 ug/h via INTRAVENOUS
  Filled 2017-10-14: qty 250

## 2017-10-14 MED ORDER — SODIUM CHLORIDE 0.9% FLUSH: 10.0000 mL | INTRAVENOUS | Status: DC | PRN

## 2017-10-14 MED ORDER — FENTANYL BOLUS VIA INFUSION
50.0000 ug | INTRAVENOUS | Status: DC | PRN
  Administered 2017-10-14: 50 ug via INTRAVENOUS
  Filled 2017-10-14: qty 50

## 2017-10-14 MED ORDER — METHYLPREDNISOLONE SODIUM SUCC 125 MG IJ SOLR
60.0000 mg | Freq: Two times a day (BID) | INTRAMUSCULAR | Status: DC
  Administered 2017-10-14: 60 mg via INTRAVENOUS
  Filled 2017-10-14: qty 2

## 2017-10-14 MED ORDER — SODIUM BICARBONATE 8.4 % IV SOLN
INTRAVENOUS | Status: DC
  Administered 2017-10-14: 16:00:00 via INTRAVENOUS
  Filled 2017-10-14 (×3): qty 150

## 2017-10-14 MED ORDER — FUROSEMIDE 10 MG/ML IJ SOLN
40.0000 mg | Freq: Three times a day (TID) | INTRAMUSCULAR | Status: DC
  Administered 2017-10-14 – 2017-10-15 (×4): 40 mg via INTRAVENOUS
  Filled 2017-10-14 (×4): qty 4

## 2017-10-14 MED ORDER — DILTIAZEM 12 MG/ML ORAL SUSPENSION
60.0000 mg | Freq: Four times a day (QID) | ORAL | Status: DC
  Administered 2017-10-14 – 2017-10-22 (×28): 60 mg
  Filled 2017-10-14 (×39): qty 6

## 2017-10-14 MED ORDER — MIDAZOLAM HCL 2 MG/2ML IJ SOLN
1.0000 mg | INTRAMUSCULAR | Status: DC | PRN
  Administered 2017-10-16 – 2017-10-18 (×8): 2 mg via INTRAVENOUS
  Administered 2017-10-18: 4 mg via INTRAVENOUS
  Administered 2017-10-18 – 2017-10-19 (×3): 2 mg via INTRAVENOUS
  Administered 2017-10-22: 1 mg via INTRAVENOUS
  Filled 2017-10-14 (×2): qty 2
  Filled 2017-10-14: qty 4
  Filled 2017-10-14: qty 2
  Filled 2017-10-14: qty 4
  Filled 2017-10-14 (×8): qty 2

## 2017-10-14 MED ORDER — CHLORHEXIDINE GLUCONATE CLOTH 2 % EX PADS
6.0000 | MEDICATED_PAD | Freq: Every day | CUTANEOUS | Status: DC
  Administered 2017-10-15 – 2017-10-23 (×8): 6 via TOPICAL

## 2017-10-14 MED ORDER — SODIUM CHLORIDE 0.9% FLUSH
10.0000 mL | Freq: Two times a day (BID) | INTRAVENOUS | Status: DC
  Administered 2017-10-14: 30 mL
  Administered 2017-10-15 – 2017-10-17 (×4): 10 mL
  Administered 2017-10-17: 20 mL
  Administered 2017-10-18 – 2017-10-21 (×8): 10 mL
  Administered 2017-10-22: 20 mL
  Administered 2017-10-22: 10 mL
  Administered 2017-10-23: 20 mL

## 2017-10-14 MED ORDER — MIDAZOLAM HCL 5 MG/ML IJ SOLN: 1.0000 mg | INTRAMUSCULAR | Status: DC | PRN

## 2017-10-14 MED ORDER — ALBUTEROL SULFATE (2.5 MG/3ML) 0.083% IN NEBU
2.5000 mg | INHALATION_SOLUTION | RESPIRATORY_TRACT | Status: DC | PRN
Start: 2017-10-14 — End: 2017-10-23
  Administered 2017-10-14 – 2017-10-17 (×2): 2.5 mg via RESPIRATORY_TRACT
  Filled 2017-10-14: qty 3

## 2017-10-14 MED ORDER — SODIUM BICARBONATE 8.4 % IV SOLN
50.0000 meq | Freq: Once | INTRAVENOUS | Status: AC
  Administered 2017-10-14: 50 meq via INTRAVENOUS
  Filled 2017-10-14: qty 50

## 2017-10-14 MED ORDER — FENTANYL CITRATE (PF) 100 MCG/2ML IJ SOLN: 50.0000 ug | Freq: Once | INTRAMUSCULAR | Status: AC

## 2017-10-14 NOTE — Progress Notes (Signed)
Peripherally Inserted Central Catheter/Midline Placement  The IV Nurse has discussed with the patient and/or persons authorized to consent for the patient, the purpose of this procedure and the potential benefits and risks involved with this procedure.  The benefits include less needle sticks, lab draws from the catheter, and the patient may be discharged home with the catheter. Risks include, but not limited to, infection, bleeding, blood clot (thrombus formation), and puncture of an artery; nerve damage and irregular heartbeat and possibility to perform a PICC exchange if needed/ordered by physician.  Alternatives to this procedure were also discussed.  Bard Power PICC patient education guide, fact sheet on infection prevention and patient information card has been provided to patient /or left at bedside.  Daughter at bedside signed consent.  PICC/Midline Placement Documentation  PICC Triple Lumen 53/79/43 PICC Right Basilic 46 cm 1 cm (Active)  Indication for Insertion or Continuance of Line Poor Vasculature-patient has had multiple peripheral attempts or PIVs lasting less than 24 hours;Vasoactive infusions;Prolonged intravenous therapies;Limited venous access - need for IV therapy >5 days (PICC only) 10/14/2017  5:52 PM  Exposed Catheter (cm) 1 cm 10/14/2017  5:52 PM  Site Assessment Clean;Dry;Intact 10/14/2017  5:52 PM  Lumen #1 Status Flushed;Saline locked;Blood return noted 10/14/2017  5:52 PM  Lumen #2 Status Flushed;Saline locked;Blood return noted 10/14/2017  5:52 PM  Lumen #3 Status Flushed;Saline locked;Blood return noted 10/14/2017  5:52 PM  Dressing Type Transparent 10/14/2017  5:52 PM  Dressing Status Clean;Dry;Intact;Antimicrobial disc in place 10/14/2017  5:52 PM  Line Care Connections checked and tightened 10/14/2017  5:52 PM  Line Adjustment (NICU/IV Team Only) No 10/14/2017  5:52 PM  Dressing Intervention New dressing 10/14/2017  5:52 PM  Dressing Change Due 10/21/17 10/14/2017  5:52 PM        Rolena Infante 10/14/2017, 5:53 PM

## 2017-10-14 NOTE — Progress Notes (Signed)
    Ct from [place] reviewd with Dr Abigail Miyamoto of radiology  - partially loculated  - possible pleural met  - worrisome for breast reated  - rt breast lumpectomy related hematoma - emphysema  Plan IR guided thora with labs ordered Ct needs opd followup   Dr. Brand Males, M.D., [facility].C.P Pulmonary and Critical Care Medicine Staff Physician, [place] Director - Interstitial Lung Disease  Program  Pulmonary [place] at [place], [place], [address]  Pager: [phone], If no answer or between  15:00h - 7:00h: call [phone] Telephone: [phone]

## 2017-10-14 NOTE — Assessment & Plan Note (Signed)
Takes po dilt at home. resstarted here in hospital last night  Plan Po dilt via tube

## 2017-10-14 NOTE — Progress Notes (Signed)
    ABG 7.18/70s/70s on RR 13 on vent which helps synchrony - on dipriova  Plan bic push and gtt Increase rr to 14 chagne sedation to fent Await thora - IR can only do it 10/15/17    Dr. Brand Males, M.D., [facility].C.P Pulmonary and Critical Care Medicine Staff Physician, [place] Director - Interstitial Lung Disease  Program  Pulmonary [place] at [place], [place], [address]  Pager: [phone], If no answer or between  15:00h - 7:00h: call [phone] Telephone: [phone]

## 2017-10-14 NOTE — Progress Notes (Signed)
Critical ABG results reported to RN.

## 2017-10-14 NOTE — Progress Notes (Signed)
Lung/Alveloar recruitment done at this time per MD. Lung recruitment perform per protocol. No complications noted, patient tolerated it well. No adverse effects noted. Patient heart rate stayed the same, patient blood pressure was stable. No hemodynamic instability noted. Patient resting comfortably at this time. Patient SATs are now 95-96% at this time.

## 2017-10-14 NOTE — Progress Notes (Signed)
Called by RN to check patient. Patient SPO2 is 88% and patient is not synchronous with vent. Suctioned patient and received very scant sputum. RN had bag lavaged patient with no change. Breathing treatment had been given. Recruitment maneuver done. SPO2 came up to 94% and then returned back to 90% and still not breathing along with vent. MD made aware and CXR was ordered.

## 2017-10-14 NOTE — Progress Notes (Signed)
ETT advanced per MD order from 21 at the lip to 23 at the lip.

## 2017-10-14 NOTE — Progress Notes (Addendum)
HISTORY & PHYSICAL  Patient Name: Ashley Conrad MRN: [id number] DOB: [birth year]    ADMISSION DATE:  10/21/2017 DATE OF SERVICE:  10/21/2017  CHIEF COMPLAINT: Acute hypoxemic and hypercapnic respiratory; loculated right pleural effusion   BRIEF  This 73 y.o. African-American female reformed smoker transferred to the [facility] Emergency Department via EMS from [facility] [place], [place]) with complaints of acute hypoxemic and hypercapnic respiratory failure and a loculated right pleural effusion.  I was contacted by Dr. Ardis Rowan at [facility] with a request to transfer the patient after she presented to [facility] emergency department with complaints of increasing dyspnea and increasing oxygen requirement.  She is a known COPD patient who uses to 5 LPM via nasal cannula at baseline.  Initially, the request was for higher level care in hopes of definitive treatment of a loculated right pleural effusion; however, during her ER stay at [facility], the patient clinically deteriorated and required endotracheal intubation and mechanical ventilatory support.  The patient was recently hospitalized (09/18/2017) at [facility], during which she underwent lumpectomy for ductal invasive carcinoma.  Apparently, the patient reported upon presentation to [facility] emergency department that she did not feel she had done well after her hospital discharge.  She initially presented with respiratory distress and oxygen saturation in the 70s on her 5 LPM via nasal cannula.  She also initially exhibited tachypnea and hypertension; however, as she deteriorated and required BiPAP support she became mildly hypotensive.  Trial of BiPAP therapy was initiated with IPAP 16, EPAP 10, FiO2 80%.  Follow-up ABGs demonstrated failure to improve her hypercapnic respiratory failure.  Conversation with Dr. Rozell Searing suggests that the patient was getting intravenous  Solu-Medrol along with aerosol therapies.  Further ER evaluation included chest x-ray, which raise concern for right-sided pleural effusion.  She had a CT angiogram of the chest which was negative for thromboembolic disease; however, the images were reported to be concerning for loculated pleural effusion.  The patient did transport with a CD, presumably containing CT images, which are being sent to radiology to be uploaded.  EVENTS  8/16: Presents to [facility] emergency department with complaints of increased dyspnea and increasing oxygen requirement.  Failed BiPAP support.  Intubated.  ER evaluation revealed a loculated right pleural effusion. 8/17: Transferred to [place] ICU afte rintubation  10/25/2017 -  - on vent. STill hypercapnic. On fent gtt 134mcg -> RASS -2 overnigt. Not on pressors. AFebrile. Making urine. Arrived in ICU few hours ago. No labs yet. On 100% fiow2/peep 5 -> pulse ox 95%   SUBJECTIVE/OVERNIGHT/INTERVAL HX 8/18 - on diprivan. INcreaed fio2 70%, peep 8. CXR still with RLL effusion/consolidation. Follows simple commands on diprivan. No fever. Makes urine. On Tube feeds    VITAL SIGNS: BP 118/76   Pulse (!) 108   Temp 98.1 F (36.7 C) (Oral)   Resp 18   Wt 82.3 kg   SpO2 92%   BMI 35.43 kg/m  Vent Mode: PRVC FiO2 (%):  [70 %] 70 % Set Rate:  [22 bmp] 22 bmp Vt Set:  [430 mL] 430 mL PEEP:  [5 cmH20-8 cmH20] 8 cmH20 Plateau Pressure:  [15 cmH20-27 cmH20] 18 cmH20 INTAKE / OUTPUT: I/O last 3 completed shifts: In: 2235.4 [I.V.:1344.5; NG/GT:230.5; IV Piggyback:660.4] Out: 900 [Urine:900]  PHYSICAL EXAMINATION:  General Appearance:    Looks criticall ill OBESE - +  Head:    Normocephalic, without obvious abnormality, atraumatic  Eyes:    PERRL - yes, conjunctiva/corneas - clear      Ears:    Normal external ear canals, both ears  Nose:   NG tube - no  Throat:  ETT TUBE - yes , OG tube - yes  Neck:   Supple,  No  enlargement/tenderness/nodules     Lungs:     Clear to auscultation bilaterally, Ventilator   Synchrony - yes. No wheeze  Chest wall:    No deformity  Heart:    S1 and S2 normal, no murmur, CVP - no.  Pressors - no  Abdomen:     Soft, no masses, no organomegaly  Genitalia:    Not done  Rectal:   not done  Extremities:   Extremities- intact. HAs sCD     Skin:   Intact in exposed areas . Sacral area - no decub reported by RN     Neurologic:   Sedation - diprivan gtt -> RASS - -3 currently . Moves all 4s - yes when awake        LABS: Review of labs from [facility] (7/89/3810 2119): Sodium 141, potassium 4.4, chloride 98, bicarbonate greater than 40, BUN 16, creatinine 0.6.  Glucose 131.  Calcium 8.7.  Protein 7.4.  Albumin 4.1.  Total bilirubin 0.4.  AST 29.  ALT 25.  Alkaline phosphatase 111.  CARDIAC ENZYMES (10/12/2017 2119): Troponin I less than 0.03.  Broke proBNP 120.  INR 1.1.  PTT 30.8.  D-dimer 510.  CBC (10/12/2017 2119): WBC 11.8 (73% neutrophils, 14% lymphocytes, 8% monocytes, 4% eosinophils, 1% basophils).  Hemoglobin 11.2, hematocrit 35.8.  MCV 97.5.  Platelets 264.  ABG (10/06/2017 0010): pH 7.18, PCO2 101, PO2 74 on BiPAP 16/10, FiO2 80% ABG (10/04/2017 0151): pH 7.10, PCO2 116, PO2 62 on AC 18/425/60/5  PULMONARY Recent Labs  Lab 10/09/2017 0513 10/20/2017 0613  PHART 7.285* 7.294*  PCO2ART 69.8* 66.8*  PO2ART 70.0* 77.0*  HCO3 33.2* 32.5*  TCO2 35* 35*  O2SAT 91.0 93.0    CBC Recent Labs  Lab 10/15/2017 0658 10/14/17 0334  HGB 11.3* 9.6*  HCT 40.2 34.7*  WBC 10.4 11.6*  PLT 216 223    COAGULATION No results for input(s): INR in the last 168 hours.  CARDIAC   Recent Labs  Lab 10/15/2017 0658 10/01/2017 1346 10/16/2017 2026  TROPONINI <0.03 <0.03 <0.03   No results for input(s): PROBNP in the last 168 hours.   CHEMISTRY Recent Labs  Lab 10/01/2017 0658 10/14/17 0334  NA 140  --   K 4.8  --   CL 104  --   CO2  30  --   GLUCOSE 170*  --   BUN 13  --   CREATININE 0.70  --   CALCIUM 8.0*  --   MG 2.3 2.2  PHOS 1.2* 2.9   Estimated Creatinine Clearance: 58.6 mL/min (by C-G formula based on SCr of 0.7 mg/dL).   LIVER Recent Labs  Lab 10/09/2017 0658  AST 21  ALT 17  ALKPHOS 85  BILITOT 0.8  PROT 6.8  ALBUMIN 3.3*     INFECTIOUS Recent Labs  Lab 10/18/2017 0658  LATICACIDVEN 1.2  PROCALCITON <0.10     ENDOCRINE CBG (last 3)  Recent Labs    10/14/17 0009 10/14/17 0343 10/14/17 0722  GLUCAP 158* 166* 187*         IMAGING x48h  - image(s) personally visualized  -   highlighted in bold Dg Abd 1 View  Result Date: 09/29/2017 CLINICAL DATA:  Encounter for OG tube placement. EXAM: ABDOMEN - 1 VIEW COMPARISON:  Chest radiograph 10/22/2017 FINDINGS: Images of the abdomen have limitations due to body habitus but there appears to be a nonobstructive bowel gas pattern with surgical clips in the pelvis. The orogastric tube is in the lower chest and likely in the mid to lower esophagus. Again noted are bibasilar chest densities, right side greater than left which are better characterized on the recent chest radiograph. IMPRESSION: Orogastric tube in the mid/distal esophagus region. Electronically Signed   By: Markus Daft M.D.   On: 10/06/2017 08:14   Dg Chest Port 1 View  Result Date: 10/14/2017 CLINICAL DATA:  Endotracheal tube. EXAM: PORTABLE CHEST 1 VIEW COMPARISON:  1 day prior FINDINGS: Endotracheal tube terminates 4.6 cm above carina.Nasogastric tube extends beyond the inferior aspect of the film. Midline trachea. Cardiomegaly accentuated by AP portable technique. Atherosclerosis in the transverse aorta. Small bilateral pleural effusions. No pneumothorax. Mild interstitial edema. Right greater than left base airspace disease. IMPRESSION: Minimal improvement in aeration. Persistent mild congestive heart failure with bilateral pleural effusions and right greater than left base airspace  disease. Aortic Atherosclerosis (ICD10-I70.0). Electronically Signed   By: Abigail Miyamoto M.D.   On: 10/14/2017 07:37   Dg Chest Port 1 View  Result Date: 10/04/2017 CLINICAL DATA:  Ventilator dependent. History of COPD and hypertension. EXAM: PORTABLE CHEST 1 VIEW COMPARISON:  09/19/2017 FINDINGS: Endotracheal tube placed with tip measuring 2.6 cm above the carina. Enteric tube tip is not visualized due to under penetration. Location cannot be confirmed. Cardiac enlargement. Pulmonary vascular congestion. Bilateral pleural effusions with atelectasis or consolidation in both lung bases, greater on the right, and progressing since previous study. No pneumothorax. IMPRESSION: Endotracheal tube with tip above the carina. Enteric tube tip is not visualized due to technique and position cannot be confirmed. Cardiac enlargement with pulmonary vascular congestion. Increasing bilateral pleural effusions and basilar atelectasis or consolidation, greater on the right. Electronically Signed   By: Lucienne Capers M.D.   On: 10/09/2017 05:36   Dg Abd Portable 1v  Result Date: 10/09/2017 CLINICAL DATA:  Check gastric catheter placement EXAM: PORTABLE ABDOMEN - 1 VIEW COMPARISON:  None. FINDINGS: Scattered large and small bowel gas is noted. Catheter is now seen within the stomach. No bony abnormality is noted. IMPRESSION: Gastric catheter within the stomach. Electronically Signed   By: Inez Catalina M.D.   On: 10/09/2017 10:28     ASSESSMENT / PLAN: Principal Problem:   Acute on chronic respiratory failure with hypoxia and hypercapnia (HCC) Active Problems:   COPD exacerbation (HCC)   Loculated pleural effusion   Agitation requiring sedation protocol   History of sinus tachycardia   Malignant neoplasm of upper-outer quadrant of right breast in female, estrogen receptor positive ([place])   Hypertension   No contraindication to deep vein thrombosis (DVT) prophylaxis   At risk for stress ulcer   At risk for  hyperglycemia   Breast cancer ([place])   Acute on chronic respiratory failure with hypoxia and hypercapnia (HCC) ABG with hypercapnia indicates not ready for extubation but also worsening fio2/peep needs 10/14/2017. CXR with possible volume overload  Plan Full vent support Start lasix   COPD exacerbation (HCC) No wheezing currently due to steroid and nebs  Plan contniue solumedrol but reduce dose Continue bD Await RVP From 10/18/2017  Loculated pleural effusion On right side present prior to admit. She is non-toxic and doubt this is loculated empyema. PCT <  0.1  Plan Get CT chest from  [place] on PACS to decide chest tube v surgery v obs Monitor PCT Abx as below - dc vanc Low threshold to DC all abx 10/15/17 depends on CT and PCT profile  Anti-infectives (From admission, onward)   Start     Dose/Rate Route Frequency Ordered Stop   10/08/2017 2000  vancomycin (VANCOCIN) IVPB 750 mg/150 ml premix  Status:  Discontinued     750 mg 150 mL/hr over 60 Minutes Intravenous Every 12 hours 10/06/2017 0645 10/14/17 0828   10/06/2017 1400  ceFEPIme (MAXIPIME) 2 g in sodium chloride 0.9 % 100 mL IVPB     2 g 200 mL/hr over 30 Minutes Intravenous Every 8 hours 10/24/2017 1203     09/29/2017 1000  ceFEPIme (MAXIPIME) 1 g in sodium chloride 0.9 % 100 mL IVPB  Status:  Discontinued     1 g 200 mL/hr over 30 Minutes Intravenous Every 12 hours 10/02/2017 0645 10/10/2017 1203   10/06/2017 0800  levofloxacin (LEVAQUIN) IVPB 750 mg  Status:  Discontinued     750 mg 100 mL/hr over 90 Minutes Intravenous Daily 10/22/2017 0645 10/10/2017 0749       Agitation requiring sedation protocol Doing well on diprivan gtt  Plan Diprivan gtt RASS goal 0 to -2 and vent synchrony  Malignant neoplasm of upper-outer quadrant of right breast in female, estrogen receptor positive ([place]) opd issue Continue arimidex opd med   Hypertension Currently bp ok  Plan moniotor  No contraindication to deep vein thrombosis (DVT)  prophylaxis Given cancer hx  Plan Sq lovenox   At risk for stress ulcer h2 blockade  At risk for hyperglycemia icu hyperglycemia protocol  History of sinus tachycardia Takes po dilt at home. resstarted here in hospital last night  Plan Po dilt via tube     FAMILY  - Updates: 10/14/2017 --> husband updated   - Inter-disciplinary family meet or Palliative Care meeting due by:  DAy 7. Current LOS is LOS 1 days   DISPO Keep in ICU     The patient is critically ill with multiple organ systems failure and requires high complexity decision making for assessment and support, frequent evaluation and titration of therapies, application of advanced monitoring technologies and extensive interpretation of multiple databases.   Critical Care Time devoted to patient care services described in this note is  3-  Minutes. This time reflects time of care of this signee Dr Brand Males. This critical care time does not reflect procedure time, or teaching time or supervisory time of PA/NP/Med student/Med Resident etc but could involve care discussion time    Dr. Brand Males, M.D., [facility]C.P Pulmonary and Critical Care Medicine Staff Physician [place] [facility] Pager: [phone], If no answer or between  15:00h - 7:00h: call [phone]  10/14/2017 8:35 AM

## 2017-10-14 NOTE — Plan of Care (Signed)
  Problem: Education: Goal: Knowledge of General Education information will improve Description Including pain rating scale, medication(s)/side effects and non-pharmacologic comfort measures Outcome: Progressing   

## 2017-10-15 ENCOUNTER — Inpatient Hospital Stay (HOSPITAL_COMMUNITY): Payer: Medicare Other

## 2017-10-15 DIAGNOSIS — J810 Acute pulmonary edema: Secondary | ICD-10-CM

## 2017-10-15 DIAGNOSIS — I10 Essential (primary) hypertension: Secondary | ICD-10-CM

## 2017-10-15 LAB — POCT I-STAT 3, ART BLOOD GAS (G3+)
ACID-BASE EXCESS: 7 mmol/L — AB (ref 0.0–2.0)
Bicarbonate: 34.5 mmol/L — ABNORMAL HIGH (ref 20.0–28.0)
O2 SAT: 93 %
PH ART: 7.354 (ref 7.350–7.450)
TCO2: 36 mmol/L — AB (ref 22–32)
pCO2 arterial: 61.7 mmHg — ABNORMAL HIGH (ref 32.0–48.0)
pO2, Arterial: 72 mmHg — ABNORMAL LOW (ref 83.0–108.0)

## 2017-10-15 LAB — BASIC METABOLIC PANEL
Anion gap: 7 (ref 5–15)
Anion gap: 8 (ref 5–15)
BUN: 52 mg/dL — AB (ref 8–23)
BUN: 42 mg/dL — AB (ref 8–23)
CALCIUM: 7.9 mg/dL — AB (ref 8.9–10.3)
CALCIUM: 7.4 mg/dL — AB (ref 8.9–10.3)
CHLORIDE: 97 mmol/L — AB (ref 98–111)
CO2: 35 mmol/L — ABNORMAL HIGH (ref 22–32)
CO2: 31 mmol/L (ref 22–32)
CREATININE: 1.35 mg/dL — AB (ref 0.44–1.00)
Chloride: 99 mmol/L (ref 98–111)
Creatinine, Ser: 1.52 mg/dL — ABNORMAL HIGH (ref 0.44–1.00)
GFR calc Af Amer: 38 mL/min — ABNORMAL LOW (ref >60)
GFR calc Af Amer: 44 mL/min — ABNORMAL LOW (ref >60)
GFR calc non Af Amer: 38 mL/min — ABNORMAL LOW (ref >60)
GFR, EST NON AFRICAN AMERICAN: 33 mL/min — AB (ref >60)
GLUCOSE: 236 mg/dL — AB (ref 70–99)
Glucose, Bld: 221 mg/dL — ABNORMAL HIGH (ref 70–99)
Potassium: 3.1 mmol/L — ABNORMAL LOW (ref 3.5–5.1)
Potassium: 3.5 mmol/L (ref 3.5–5.1)
SODIUM: 142 mmol/L (ref 135–145)
Sodium: 135 mmol/L (ref 135–145)

## 2017-10-15 LAB — BLOOD GAS, ARTERIAL
Acid-Base Excess: 4.7 mmol/L — ABNORMAL HIGH (ref 0.0–2.0)
Acid-Base Excess: 3.8 mmol/L — ABNORMAL HIGH (ref 0.0–2.0)
Acid-Base Excess: 4.4 mmol/L — ABNORMAL HIGH (ref 0.0–2.0)
BICARBONATE: 31.6 mmol/L — AB (ref 20.0–28.0)
BICARBONATE: 30.9 mmol/L — AB (ref 20.0–28.0)
Bicarbonate: 33.1 mmol/L — ABNORMAL HIGH (ref 20.0–28.0)
Drawn by: 441661
Drawn by: 441661
FIO2: 1
FIO2: 100
FIO2: 1
MECHVT: 430 mL
O2 SAT: 93.2 %
O2 Saturation: 94.7 %
O2 Saturation: 92.9 %
PATIENT TEMPERATURE: 98.6
PATIENT TEMPERATURE: 98.6
PATIENT TEMPERATURE: 98.6
PCO2 ART: 98.5 mmHg — AB (ref 32.0–48.0)
PCO2 ART: 70 mmHg — AB (ref 32.0–48.0)
PEEP/CPAP: 12 cmH2O
PEEP: 12 cmH2O
PH ART: 7.153 — AB (ref 7.350–7.450)
PH ART: 7.187 — AB (ref 7.350–7.450)
PO2 ART: 68.4 mmHg — AB (ref 83.0–108.0)
RATE: 14 resp/min
RATE: 24 resp/min
VT: 430 mL
pCO2 arterial: 86.5 mmHg (ref 32.0–48.0)
pH, Arterial: 7.267 — ABNORMAL LOW (ref 7.350–7.450)
pO2, Arterial: 82.8 mmHg — ABNORMAL LOW (ref 83.0–108.0)
pO2, Arterial: 84 mmHg (ref 83.0–108.0)

## 2017-10-15 LAB — CBC WITH DIFFERENTIAL/PLATELET
BASOS ABS: 0 10*3/uL (ref 0.0–0.1)
Basophils Relative: 0 %
EOS PCT: 0 %
Eosinophils Absolute: 0 10*3/uL (ref 0.0–0.7)
HEMATOCRIT: 32.2 % — AB (ref 36.0–46.0)
HEMOGLOBIN: 9.9 g/dL — AB (ref 12.0–15.0)
Lymphocytes Relative: 2 %
Lymphs Abs: 0.3 10*3/uL — ABNORMAL LOW (ref 0.7–4.0)
MCH: 35.1 pg — ABNORMAL HIGH (ref 26.0–34.0)
MCHC: 30.7 g/dL (ref 30.0–36.0)
MCV: 114.2 fL — AB (ref 78.0–100.0)
MONOS PCT: 5 %
Monocytes Absolute: 0.8 10*3/uL (ref 0.1–1.0)
NEUTROS PCT: 93 %
Neutro Abs: 14.8 10*3/uL — ABNORMAL HIGH (ref 1.7–7.7)
Platelets: 215 10*3/uL (ref 150–400)
RBC: 2.82 MIL/uL — AB (ref 3.87–5.11)
RDW: 13.1 % (ref 11.5–15.5)
WBC: 15.9 10*3/uL — AB (ref 4.0–10.5)

## 2017-10-15 LAB — PROCALCITONIN: PROCALCITONIN: 0.11 ng/mL

## 2017-10-15 LAB — GLUCOSE, CAPILLARY
GLUCOSE-CAPILLARY: 194 mg/dL — AB (ref 70–99)
GLUCOSE-CAPILLARY: 185 mg/dL — AB (ref 70–99)
Glucose-Capillary: 229 mg/dL — ABNORMAL HIGH (ref 70–99)
Glucose-Capillary: 211 mg/dL — ABNORMAL HIGH (ref 70–99)
Glucose-Capillary: 140 mg/dL — ABNORMAL HIGH (ref 70–99)
Glucose-Capillary: 162 mg/dL — ABNORMAL HIGH (ref 70–99)

## 2017-10-15 LAB — PHOSPHORUS: PHOSPHORUS: 5.6 mg/dL — AB (ref 2.5–4.6)

## 2017-10-15 LAB — LACTATE DEHYDROGENASE: LDH: 265 U/L — AB (ref 98–192)

## 2017-10-15 LAB — PROTEIN, TOTAL: Total Protein: 5.2 g/dL — ABNORMAL LOW (ref 6.5–8.1)

## 2017-10-15 LAB — MAGNESIUM: Magnesium: 2.9 mg/dL — ABNORMAL HIGH (ref 1.7–2.4)

## 2017-10-15 MED ORDER — INSULIN ASPART 100 UNIT/ML ~~LOC~~ SOLN
3.0000 [IU] | SUBCUTANEOUS | Status: DC
  Administered 2017-10-15: 6 [IU] via SUBCUTANEOUS
  Administered 2017-10-15: 3 [IU] via SUBCUTANEOUS
  Administered 2017-10-15: 9 [IU] via SUBCUTANEOUS
  Administered 2017-10-16: 6 [IU] via SUBCUTANEOUS
  Administered 2017-10-16: 3 [IU] via SUBCUTANEOUS
  Administered 2017-10-16 (×4): 6 [IU] via SUBCUTANEOUS
  Administered 2017-10-17 (×2): 9 [IU] via SUBCUTANEOUS
  Administered 2017-10-17: 6 [IU] via SUBCUTANEOUS
  Administered 2017-10-17: 3 [IU] via SUBCUTANEOUS
  Administered 2017-10-17 – 2017-10-18 (×4): 6 [IU] via SUBCUTANEOUS
  Administered 2017-10-18: 9 [IU] via SUBCUTANEOUS
  Administered 2017-10-18 (×2): 6 [IU] via SUBCUTANEOUS

## 2017-10-15 MED ORDER — DEXMEDETOMIDINE HCL IN NACL 400 MCG/100ML IV SOLN
0.4000 ug/kg/h | INTRAVENOUS | Status: DC
  Administered 2017-10-15 (×2): 0.9 ug/kg/h via INTRAVENOUS
  Administered 2017-10-15: 0.4 ug/kg/h via INTRAVENOUS
  Administered 2017-10-16: 0.7 ug/kg/h via INTRAVENOUS
  Administered 2017-10-16 (×2): 0.9 ug/kg/h via INTRAVENOUS
  Administered 2017-10-16: 1 ug/kg/h via INTRAVENOUS
  Administered 2017-10-16: 0.9 ug/kg/h via INTRAVENOUS
  Administered 2017-10-17 (×2): 1.2 ug/kg/h via INTRAVENOUS
  Administered 2017-10-17: 1 ug/kg/h via INTRAVENOUS
  Administered 2017-10-17 – 2017-10-18 (×5): 1.2 ug/kg/h via INTRAVENOUS
  Administered 2017-10-19 (×2): 0.2 ug/kg/h via INTRAVENOUS
  Administered 2017-10-20: 0.4 ug/kg/h via INTRAVENOUS
  Administered 2017-10-20: 0.6 ug/kg/h via INTRAVENOUS
  Administered 2017-10-21: 0.8 ug/kg/h via INTRAVENOUS
  Administered 2017-10-21: 1 ug/kg/h via INTRAVENOUS
  Administered 2017-10-21: 0.4 ug/kg/h via INTRAVENOUS
  Administered 2017-10-21 – 2017-10-22 (×2): 0.5 ug/kg/h via INTRAVENOUS
  Filled 2017-10-15 (×24): qty 100

## 2017-10-15 MED ORDER — MORPHINE 100MG IN NS 100ML (1MG/ML) PREMIX INFUSION
2.0000 mg/h | INTRAVENOUS | Status: DC
  Administered 2017-10-15: 2 mg/h via INTRAVENOUS
  Filled 2017-10-15 (×2): qty 100

## 2017-10-15 MED ORDER — IPRATROPIUM-ALBUTEROL 0.5-2.5 (3) MG/3ML IN SOLN
3.0000 mL | RESPIRATORY_TRACT | Status: DC
  Administered 2017-10-15 (×4): 3 mL via RESPIRATORY_TRACT
  Filled 2017-10-15 (×5): qty 3

## 2017-10-15 MED ORDER — CISATRACURIUM BESYLATE 20 MG/10ML IV SOLN
10.0000 mg | Freq: Once | INTRAVENOUS | Status: AC
  Administered 2017-10-15: 10 mg via INTRAVENOUS
  Filled 2017-10-15 (×2): qty 10

## 2017-10-15 MED ORDER — MAGNESIUM SULFATE 2 GM/50ML IV SOLN
2.0000 g | Freq: Once | INTRAVENOUS | Status: AC
  Administered 2017-10-15: 2 g via INTRAVENOUS
  Filled 2017-10-15: qty 50

## 2017-10-15 MED ORDER — ALBUTEROL (5 MG/ML) CONTINUOUS INHALATION SOLN
10.0000 mg/h | INHALATION_SOLUTION | RESPIRATORY_TRACT | Status: AC
  Administered 2017-10-15: 10 mg/h via RESPIRATORY_TRACT
  Filled 2017-10-15: qty 20

## 2017-10-15 MED ORDER — KETAMINE HCL-SODIUM CHLORIDE 100-0.9 MG/10ML-% IV SOSY: 0.5000 mg/kg | PREFILLED_SYRINGE | Freq: Once | INTRAVENOUS | Status: DC

## 2017-10-15 MED ORDER — POTASSIUM CHLORIDE 20 MEQ/15ML (10%) PO SOLN
40.0000 meq | Freq: Three times a day (TID) | ORAL | Status: AC
  Administered 2017-10-15 (×2): 40 meq
  Filled 2017-10-15 (×2): qty 30

## 2017-10-15 MED ORDER — CISATRACURIUM BOLUS VIA INFUSION
10.0000 mg | Freq: Once | INTRAVENOUS | Status: DC
  Filled 2017-10-15: qty 10

## 2017-10-15 MED ORDER — SODIUM CHLORIDE 0.9 % IV SOLN
2.0000 g | INTRAVENOUS | Status: DC
  Administered 2017-10-16 – 2017-10-19 (×4): 2 g via INTRAVENOUS
  Filled 2017-10-15 (×4): qty 2

## 2017-10-15 MED ORDER — SODIUM CHLORIDE 0.9 % IV SOLN
INTRAVENOUS | Status: DC
  Administered 2017-10-19 – 2017-10-23 (×2): via INTRAVENOUS

## 2017-10-15 MED ORDER — SODIUM CHLORIDE 0.9 % IV SOLN
0.5000 mg/kg/h | INTRAVENOUS | Status: AC
  Administered 2017-10-15: 0.5 mg/kg/h via INTRAVENOUS
  Filled 2017-10-15: qty 2

## 2017-10-15 MED ORDER — FENTANYL BOLUS VIA INFUSION
25.0000 ug | INTRAVENOUS | Status: DC | PRN
  Administered 2017-10-15 – 2017-10-22 (×14): 25 ug via INTRAVENOUS
  Filled 2017-10-15: qty 25

## 2017-10-15 MED ORDER — FENTANYL 2500MCG IN NS 250ML (10MCG/ML) PREMIX INFUSION
25.0000 ug/h | INTRAVENOUS | Status: DC
  Administered 2017-10-15: 300 ug/h via INTRAVENOUS
  Administered 2017-10-15: 200 ug/h via INTRAVENOUS
  Administered 2017-10-16 (×2): 300 ug/h via INTRAVENOUS
  Administered 2017-10-16: 400 ug/h via INTRAVENOUS
  Administered 2017-10-17: 350 ug/h via INTRAVENOUS
  Administered 2017-10-17: 400 ug/h via INTRAVENOUS
  Filled 2017-10-15 (×7): qty 250

## 2017-10-15 MED ORDER — FUROSEMIDE 10 MG/ML IJ SOLN
40.0000 mg | Freq: Four times a day (QID) | INTRAMUSCULAR | Status: AC
  Administered 2017-10-15 (×3): 40 mg via INTRAVENOUS
  Filled 2017-10-15 (×3): qty 4

## 2017-10-15 MED ORDER — LIDOCAINE HCL (PF) 1 % IJ SOLN
INTRAMUSCULAR | Status: AC
  Filled 2017-10-15: qty 30

## 2017-10-15 MED ORDER — CISATRACURIUM BESYLATE 20 MG/10ML IV SOLN
10.0000 mg | Freq: Once | INTRAVENOUS | Status: AC
  Administered 2017-10-15: 10 mg via INTRAVENOUS
  Filled 2017-10-15: qty 10

## 2017-10-15 MED ORDER — METHYLPREDNISOLONE SODIUM SUCC 125 MG IJ SOLR
40.0000 mg | Freq: Four times a day (QID) | INTRAMUSCULAR | Status: DC
  Administered 2017-10-15 – 2017-10-18 (×14): 40 mg via INTRAVENOUS
  Filled 2017-10-15 (×14): qty 2

## 2017-10-15 MED ORDER — KETAMINE PEDS BOLUS VIA INFUSION
0.5000 mg/kg | Freq: Once | INTRAVENOUS | Status: AC
  Administered 2017-10-15: 41.15 mg via INTRAVENOUS
  Filled 2017-10-15: qty 50

## 2017-10-15 MED FILL — Fentanyl Citrate Preservative Free (PF) Inj 100 MCG/2ML: INTRAMUSCULAR | Qty: 2 | Status: AC

## 2017-10-15 NOTE — Progress Notes (Signed)
Critical ABG results verbal given to MD Carson Myrtle. Intervention was made to increase rate to 24.

## 2017-10-15 NOTE — Progress Notes (Signed)
MD aware of patient increase PIP/Plat pressures. Pt is dyssynchronous with the vent, MD wants to decrease the trigger sensitivity to -10. Trigger sensitivity is -10 at this time.

## 2017-10-15 NOTE — Progress Notes (Signed)
[place] notified of new blood gas result. Will continue to monitor.

## 2017-10-15 NOTE — Procedures (Signed)
Arterial Catheter Insertion Procedure Note Ashley Conrad [id number] Jun 02, 1945  Procedure: Insertion of Arterial Catheter  Indications: Blood pressure monitoring and Frequent blood sampling  Procedure Details Consent: Risks of procedure as well as the alternatives and risks of each were explained to the (patient/caregiver).  Consent for procedure obtained. Time Out: Verified patient identification, verified procedure, site/side was marked, verified correct patient position, special equipment/implants available, medications/allergies/relevent history reviewed, required imaging and test results available.  Performed  Maximum sterile technique was used including antiseptics, cap, gloves, gown, hand hygiene, mask and sheet. Skin prep: Chlorhexidine; local anesthetic administered 20 gauge catheter was inserted into left radial artery using the Seldinger technique. ULTRASOUND GUIDANCE USED: NO Evaluation Blood flow good; BP tracing good. Complications: No apparent complications.   [name] 10/15/2017

## 2017-10-15 NOTE — Progress Notes (Signed)
HISTORY & PHYSICAL  Patient Name: Ashley Conrad MRN: [id number] DOB: 07-Feb-1946    ADMISSION DATE:  10/10/2017 DATE OF SERVICE:  09/27/2017  CHIEF COMPLAINT: Acute hypoxemic and hypercapnic respiratory; loculated right pleural effusion   BRIEF  This 72 y.o. African-American female reformed smoker transferred to the [facility] Emergency Department via EMS from [facility] [place], [place]) with complaints of acute hypoxemic and hypercapnic respiratory failure and a loculated right pleural effusion.  I was contacted by Dr. Ardis Rowan at [facility] with a request to transfer the patient after she presented to [facility] emergency department with complaints of increasing dyspnea and increasing oxygen requirement.  She is a known COPD patient who uses to 5 LPM via nasal cannula at baseline.  Initially, the request was for higher level care in hopes of definitive treatment of a loculated right pleural effusion; however, during her ER stay at [facility], the patient clinically deteriorated and required endotracheal intubation and mechanical ventilatory support.  The patient was recently hospitalized (09/18/2017) at [facility], during which she underwent lumpectomy for ductal invasive carcinoma.  Apparently, the patient reported upon presentation to [facility] emergency department that she did not feel she had done well after her hospital discharge.  She initially presented with respiratory distress and oxygen saturation in the 70s on her 5 LPM via nasal cannula.  She also initially exhibited tachypnea and hypertension; however, as she deteriorated and required BiPAP support she became mildly hypotensive.  Trial of BiPAP therapy was initiated with IPAP 16, EPAP 10, FiO2 80%.  Follow-up ABGs demonstrated failure to improve her hypercapnic respiratory failure.  Conversation with Dr. Rozell Searing suggests that the patient was getting intravenous  Solu-Medrol along with aerosol therapies.  Further ER evaluation included chest x-ray, which raise concern for right-sided pleural effusion.  She had a CT angiogram of the chest which was negative for thromboembolic disease; however, the images were reported to be concerning for loculated pleural effusion.  The patient did transport with a CD, presumably containing CT images, which are being sent to radiology to be uploaded.  EVENTS  8/16: Presents to [facility] emergency department with complaints of increased dyspnea and increasing oxygen requirement.  Failed BiPAP support.  Intubated.  ER evaluation revealed a loculated right pleural effusion. 8/17: Transferred to [place] ICU afte rintubation  09/29/2017 -  - on vent. STill hypercapnic. On fent gtt 199mcg -> RASS -2 overnigt. Not on pressors. AFebrile. Making urine. Arrived in ICU few hours ago. No labs yet. On 100% fiow2/peep 5 -> pulse ox 95%   SUBJECTIVE/OVERNIGHT/INTERVAL HX 8/18 - on diprivan. INcreaed fio2 70%, peep 8. CXR still with RLL effusion/consolidation. Follows simple commands on diprivan. No fever. Makes urine. On Tube feeds    VITAL SIGNS: BP 116/61 (BP Location: Right Leg)   Pulse 98   Temp 97.8 F (36.6 C) (Oral)   Resp (!) 24   Ht 5' (1.524 m)   Wt 82 kg   SpO2 93%   BMI 35.31 kg/m  Vent Mode: PRVC FiO2 (%):  [100 %] 100 % Set Rate:  [14 bmp-24 bmp] 24 bmp Vt Set:  [430 mL] 430 mL PEEP:  [8 cmH20-12 cmH20] 12 cmH20 Plateau Pressure:  [17 cmH20-35 cmH20] 23 cmH20 INTAKE / OUTPUT: I/O last 3 completed shifts: In: 4392.3 [I.V.:2633.6; NG/GT:980; IV Piggyback:778.7] Out: 1980 [Urine:1980]  PHYSICAL EXAMINATION: General: acutely ill appearing female, sedate, NAD Neuro: Sedate, unresponsive, withdraws all  ext to pain HEENT: [place]/AT, PERRL, EOM-I and MMM Hrt: RRR, Nl S1/S2 and -M/R/G Lung: Decreased BS L>R, otherwise coarse Abdomen: Soft, NT, ND and +BS Ext: -edema and  -tenderness Skin: Intact  LABS: Review of labs from [place] (0/11/9321 2119): Sodium 141, potassium 4.4, chloride 98, bicarbonate greater than 40, BUN 16, creatinine 0.6.  Glucose 131.  Calcium 8.7.  Protein 7.4.  Albumin 4.1.  Total bilirubin 0.4.  AST 29.  ALT 25.  Alkaline phosphatase 111.  CARDIAC ENZYMES (10/12/2017 2119): Troponin I less than 0.03.  Broke proBNP 120.  INR 1.1.  PTT 30.8.  D-dimer 510.  CBC (10/12/2017 2119): WBC 11.8 (73% neutrophils, 14% lymphocytes, 8% monocytes, 4% eosinophils, 1% basophils).  Hemoglobin 11.2, hematocrit 35.8.  MCV 97.5.  Platelets 264.  ABG (10/20/2017 0010): pH 7.18, PCO2 101, PO2 74 on BiPAP 16/10, FiO2 80% ABG (10/09/2017 0151): pH 7.10, PCO2 116, PO2 62 on AC 18/425/60/5  PULMONARY Recent Labs  Lab 10/16/2017 0513 10/02/2017 0613 10/14/17 1444 10/14/17 1724 10/15/17 0111 10/15/17 0420 10/15/17 0600  PHART 7.285* 7.294* 7.189* 7.241* 7.153* 7.187* 7.267*  PCO2ART 69.8* 66.8* 79.3* 75.2* 98.5* 86.5* 70.0*  PO2ART 70.0* 77.0* 76.7* 88.0 82.8* 84.0 68.4*  HCO3 33.2* 32.5* 29.0* 32.3* 33.1* 31.6* 30.9*  TCO2 35* 35*  --  35*  --   --   --   O2SAT 91.0 93.0 92.5 94.0 93.2 94.7 92.9   CBC Recent Labs  Lab 10/06/2017 0658 10/14/17 0334 10/15/17 0339  HGB 11.3* 9.6* 9.9*  HCT 40.2 34.7* 32.2*  WBC 10.4 11.6* 15.9*  PLT 216 223 215   COAGULATION No results for input(s): INR in the last 168 hours.  CARDIAC   Recent Labs  Lab 09/30/2017 0658 10/05/2017 1346 10/14/2017 2026  TROPONINI <0.03 <0.03 <0.03   No results for input(s): PROBNP in the last 168 hours.  CHEMISTRY Recent Labs  Lab 10/08/2017 0658 10/14/17 0334 10/14/17 2343 10/15/17 0339  NA 140  --  135  --   K 4.8  --  3.5  --   CL 104  --  97*  --   CO2 30  --  31  --   GLUCOSE 170*  --  221*  --   BUN 13  --  42*  --   CREATININE 0.70  --  1.35*  --   CALCIUM 8.0*  --  7.4*  --   MG 2.3 2.2  --  2.9*  PHOS 1.2* 2.9  --  5.6*    Estimated Creatinine Clearance: 34.7 mL/min (A) (by C-G formula based on SCr of 1.35 mg/dL (H)).  LIVER Recent Labs  Lab 10/12/2017 0658 10/14/17 1443 10/15/17 0339  AST 21  --   --   ALT 17  --   --   ALKPHOS 85  --   --   BILITOT 0.8  --   --   PROT 6.8  --  5.2*  ALBUMIN 3.3* 3.2*  --    INFECTIOUS Recent Labs  Lab 10/19/2017 0658 10/14/17 0334 10/15/17 0339  LATICACIDVEN 1.2  --   --   PROCALCITON <0.10 <0.10 0.11   ENDOCRINE CBG (last 3)  Recent Labs    10/14/17 2342 10/15/17 0353 10/15/17 0755  GLUCAP 208* 185* 229*   IMAGING x48h  - image(s) personally visualized  -   highlighted in bold Dg Chest Port 1 View  Result Date: 10/15/2017 CLINICAL DATA:  Hypoxia EXAM: PORTABLE CHEST 1 VIEW COMPARISON:  10/14/2017, 10/16/2017, CT chest 10/12/2017 FINDINGS: Endotracheal tube tip about 3 cm superior to carina. Esophageal tube tip below the diaphragm but non visualized. Left upper extremity catheter tip overlies the proximal right atrium. Moderate bilateral pleural effusions with loculation and layering on the right. Cardiomegaly with vascular congestion. Dense airspace disease at both lung bases. Aortic atherosclerosis. No pneumothorax. IMPRESSION: 1. Support lines and tubes as above. 2. No significant interval change in moderate bilateral pleural effusions right greater than left with loculation and layering on the right. Continued airspace disease at both bases. 3. Cardiomegaly Electronically Signed   By: Donavan Foil M.D.   On: 10/15/2017 00:41   Dg Chest Port 1 View  Result Date: 10/14/2017 CLINICAL DATA:  Intubated and change in respiratory status. EXAM: PORTABLE CHEST 1 VIEW COMPARISON:  Earlier today at 0457 hours FINDINGS: 1147 hours. Endotracheal tube terminates 2.4 cm above carina.Nasogastric tube extends beyond the inferior aspect of the film. Cardiomegaly accentuated by AP portable technique. Atherosclerosis in the transverse aorta. Layering right pleural effusion,  moderate, similar. Tiny left pleural effusion is not significantly changed. No pneumothorax. mild pulmonary venous congestion remains. Right greater than left base airspace disease is similar. IMPRESSION: No significant change since earlier today. Cardiomegaly with mild congestive heart failure, bilateral pleural effusions, and bibasilar airspace disease. Aortic Atherosclerosis (ICD10-I70.0). Electronically Signed   By: Abigail Miyamoto M.D.   On: 10/14/2017 12:36   Dg Chest Port 1 View  Result Date: 10/14/2017 CLINICAL DATA:  Endotracheal tube. EXAM: PORTABLE CHEST 1 VIEW COMPARISON:  1 day prior FINDINGS: Endotracheal tube terminates 4.6 cm above carina.Nasogastric tube extends beyond the inferior aspect of the film. Midline trachea. Cardiomegaly accentuated by AP portable technique. Atherosclerosis in the transverse aorta. Small bilateral pleural effusions. No pneumothorax. Mild interstitial edema. Right greater than left base airspace disease. IMPRESSION: Minimal improvement in aeration. Persistent mild congestive heart failure with bilateral pleural effusions and right greater than left base airspace disease. Aortic Atherosclerosis (ICD10-I70.0). Electronically Signed   By: Abigail Miyamoto M.D.   On: 10/14/2017 07:37   Dg Abd Portable 1v  Result Date: 10/14/2017 CLINICAL DATA:  Check gastric catheter placement EXAM: PORTABLE ABDOMEN - 1 VIEW COMPARISON:  None. FINDINGS: Scattered large and small bowel gas is noted. Catheter is now seen within the stomach. No bony abnormality is noted. IMPRESSION: Gastric catheter within the stomach. Electronically Signed   By: Inez Catalina M.D.   On: 09/27/2017 10:28   [place] Ekg Site Rite  Result Date: 10/14/2017 If Site Rite image not attached, placement could not be confirmed due to current cardiac rhythm.    ASSESSMENT / PLAN: Principal Problem:   Acute on chronic respiratory failure with hypoxia and hypercapnia (HCC) Active Problems:   Malignant neoplasm of  upper-outer quadrant of right breast in female, estrogen receptor positive (HCC)   Breast cancer (HCC)   COPD exacerbation (HCC)   Loculated pleural effusion   Hypertension   Agitation requiring sedation protocol   No contraindication to deep vein thrombosis (DVT) prophylaxis   At risk for stress ulcer   At risk for hyperglycemia   History of sinus tachycardia   Acute on chronic respiratory failure with hypoxia and hypercapnia (HCC) ABG with hypercapnia indicates not ready for extubation but also worsening fio2/peep needs 10/14/2017. CXR with possible volume overload Plan: Maintain on full vent support Increase RR to 30 and decrease I time to 0.7 Replace a-line Adjust vent for ABG Titrate O2 for sat of 88-92% Active diureses today and  give PO K D/C Bicarb drip  COPD exacerbation (HCC) No wheezing currently due to steroid and nebs Plan: Continue solumedrol at current dose Continue BD RVP negative  Loculated pleural effusion On right side present prior to admit. She is non-toxic and doubt this is loculated empyema. PCT < 0.1 Plan: Thora per IR today for loculated pleural effusion Monitor PCT Abx as below - dc vanc Maintain on abx for now given WBC, unsure how effective is PCT while patient is on chemotherapy  Agitation requiring sedation protocol Plan: Propofol drip, will continue for today given vent needs Change to fentanyl drip from morphine Will add precedex  Malignant neoplasm of upper-outer quadrant of right breast in female, estrogen receptor positive ([place]) Continue arimidex opd med  Hypertension Currently bp ok Plan: Monitor Diureses  No contraindication to deep vein thrombosis (DVT) prophylaxis Given cancer hx Plan: Sq lovenox   At risk for stress ulcer h2 blockade  At risk for hyperglycemia icu hyperglycemia protocol  History of sinus tachycardia Takes po dilt at home. resstarted here in hospital last night  Plan Po dilt via tube  FAMILY   - Updates: 10/15/2017 --> husband updated  - Inter-disciplinary family meet or Palliative Care meeting due by:  Day 7. Current LOS is LOS 2 days  DISPO Keep in ICU, daughter updated bedside.  The patient is critically ill with multiple organ systems failure and requires high complexity decision making for assessment and support, frequent evaluation and titration of therapies, application of advanced monitoring technologies and extensive interpretation of multiple databases.   Critical Care Time devoted to patient care services described in this note is  354  Minutes. This time reflects time of care of this signee Dr [name]. This critical care time does not reflect procedure time, or teaching time or supervisory time of PA/NP/Med student/Med Resident etc but could involve care discussion time.  Rush Farmer, M.D. [facility] Pulmonary/Critical Care Medicine. Pager: [phone]. After hours pager: [phone].  10/15/2017 9:13 AM

## 2017-10-15 NOTE — Progress Notes (Signed)
Critical ABG results reported to RN.

## 2017-10-15 NOTE — Progress Notes (Signed)
Attempted right sided diagnostic bedside thoracentesis today - no fluid was able to be removed pleural space, possibly due to loculation. Patient tolerated procedure well, no complications, follow up CXR showed no pneumothorax and unchanged small loculated right pleural effusion.  Patient discussed with Dr. Barbie Banner today - plan for now to have patient brought down to IR once she is able for image guided diagnostic thoracentesis.  Please call IR with any questions or concerns.  Candiss Norse, PA-C 10/15/17 1423

## 2017-10-15 NOTE — Progress Notes (Signed)
[place] Progress Note Patient Name: Ashley Conrad DOB: 1945/03/15 MRN: [id number]   Date of Service  10/15/2017  HPI/Events of Note  Patient desaturating in the 80s even on 100% fio2. Peak pressures alarming continuously. plateau variable.   eICU Interventions  She is behaving like asthmatic, very tight, needing more E time to exhale out completely to avoid dynamic hyperinflation and auto-peep. She was double triggering the vent which was adding to her hyperinflation and air trapping. Increased sensitivity on vent to -10 cm and since then she is not double triggering. Also peak pressures came down from 45 to 25 after getting nimbex 10mg  iv x1. ? Fentanyl chest syndrome. Will stop fentanyl and start morphine. Also will give albuterol 10mg  inhaled over an hour and increase duoneb frequency to every 3 hours. Check ABG. Will give ketamine also for 2 hours to see if it helps with bronchodilation and sedation without dropping her BP on propofol further. She may need nimbex infusion later in the night if she starts having increased vent asynchrony and peak pressures alarming. I repeated CXR just now which is showing right sided loculated effusion. She is to have thoracentesis today morning. Her saturation has improved from 86 to 92 after receiving nimbex. She is making urine.      Intervention Category Major Interventions: Respiratory failure - evaluation and management  Sharia Reeve 10/15/2017, 12:49 AM

## 2017-10-15 NOTE — Progress Notes (Signed)
100 ml's of Fentanyl wasted, in sink, with Delphia Grates, RN.

## 2017-10-15 NOTE — Progress Notes (Signed)
Critical ABG results verbally given to RN.

## 2017-10-15 NOTE — Progress Notes (Signed)
Pt has bilateral restricted extremities, per verbal order MD Panchal wants me to stick brachial artery. RRT was able to get ABG right brachial artery revealing Marked Respiratory Acidosis.

## 2017-10-16 ENCOUNTER — Inpatient Hospital Stay (HOSPITAL_COMMUNITY): Payer: Medicare Other

## 2017-10-16 LAB — GLUCOSE, CAPILLARY
GLUCOSE-CAPILLARY: 151 mg/dL — AB (ref 70–99)
GLUCOSE-CAPILLARY: 158 mg/dL — AB (ref 70–99)
Glucose-Capillary: 155 mg/dL — ABNORMAL HIGH (ref 70–99)
Glucose-Capillary: 165 mg/dL — ABNORMAL HIGH (ref 70–99)
Glucose-Capillary: 148 mg/dL — ABNORMAL HIGH (ref 70–99)
Glucose-Capillary: 195 mg/dL — ABNORMAL HIGH (ref 70–99)

## 2017-10-16 LAB — BASIC METABOLIC PANEL
ANION GAP: 9 (ref 5–15)
BUN: 63 mg/dL — ABNORMAL HIGH (ref 8–23)
CALCIUM: 8.5 mg/dL — AB (ref 8.9–10.3)
CO2: 34 mmol/L — AB (ref 22–32)
Chloride: 101 mmol/L (ref 98–111)
Creatinine, Ser: 1.34 mg/dL — ABNORMAL HIGH (ref 0.44–1.00)
GFR calc Af Amer: 44 mL/min — ABNORMAL LOW (ref >60)
GFR calc non Af Amer: 38 mL/min — ABNORMAL LOW (ref >60)
GLUCOSE: 171 mg/dL — AB (ref 70–99)
Potassium: 4.3 mmol/L (ref 3.5–5.1)
Sodium: 144 mmol/L (ref 135–145)

## 2017-10-16 LAB — CBC WITH DIFFERENTIAL/PLATELET
Abs Immature Granulocytes: 0.1 10*3/uL (ref 0.0–0.1)
BASOS ABS: 0 10*3/uL (ref 0.0–0.1)
BASOS PCT: 0 %
EOS ABS: 0 10*3/uL (ref 0.0–0.7)
EOS PCT: 0 %
HCT: 33.2 % — ABNORMAL LOW (ref 36.0–46.0)
HEMOGLOBIN: 9.5 g/dL — AB (ref 12.0–15.0)
Immature Granulocytes: 1 %
Lymphocytes Relative: 3 %
Lymphs Abs: 0.4 10*3/uL — ABNORMAL LOW (ref 0.7–4.0)
MCH: 30.6 pg (ref 26.0–34.0)
MCHC: 28.6 g/dL — AB (ref 30.0–36.0)
MCV: 107.1 fL — ABNORMAL HIGH (ref 78.0–100.0)
MONO ABS: 0.7 10*3/uL (ref 0.1–1.0)
Monocytes Relative: 5 %
Neutro Abs: 11.8 10*3/uL — ABNORMAL HIGH (ref 1.7–7.7)
Neutrophils Relative %: 91 %
PLATELETS: 220 10*3/uL (ref 150–400)
RBC: 3.1 MIL/uL — ABNORMAL LOW (ref 3.87–5.11)
RDW: 13.3 % (ref 11.5–15.5)
WBC: 13 10*3/uL — ABNORMAL HIGH (ref 4.0–10.5)

## 2017-10-16 LAB — BLOOD GAS, ARTERIAL
ACID-BASE EXCESS: 10.5 mmol/L — AB (ref 0.0–2.0)
Bicarbonate: 35.1 mmol/L — ABNORMAL HIGH (ref 20.0–28.0)
DRAWN BY: 44166
FIO2: 90
MECHVT: 430 mL
O2 Saturation: 86.5 %
PCO2 ART: 53.3 mmHg — AB (ref 32.0–48.0)
PEEP/CPAP: 12 cmH2O
PH ART: 7.435 (ref 7.350–7.450)
Patient temperature: 98.6
RATE: 30 resp/min
pO2, Arterial: 51.5 mmHg — ABNORMAL LOW (ref 83.0–108.0)

## 2017-10-16 LAB — POCT I-STAT 3, ART BLOOD GAS (G3+)
Acid-Base Excess: 7 mmol/L — ABNORMAL HIGH (ref 0.0–2.0)
BICARBONATE: 36.5 mmol/L — AB (ref 20.0–28.0)
O2 Saturation: 89 %
PCO2 ART: 85.2 mmHg — AB (ref 32.0–48.0)
PO2 ART: 69 mmHg — AB (ref 83.0–108.0)
TCO2: 39 mmol/L — AB (ref 22–32)
pH, Arterial: 7.24 — ABNORMAL LOW (ref 7.350–7.450)

## 2017-10-16 LAB — MAGNESIUM: MAGNESIUM: 2.8 mg/dL — AB (ref 1.7–2.4)

## 2017-10-16 LAB — PHOSPHORUS: Phosphorus: 3 mg/dL (ref 2.5–4.6)

## 2017-10-16 MED ORDER — METOLAZONE 5 MG PO TABS
5.0000 mg | ORAL_TABLET | Freq: Every day | ORAL | Status: AC
  Administered 2017-10-16: 5 mg via ORAL
  Filled 2017-10-16: qty 1

## 2017-10-16 MED ORDER — ACETYLCYSTEINE 20 % IN SOLN
3.0000 mL | RESPIRATORY_TRACT | Status: DC
  Administered 2017-10-17 (×3): 3 mL via RESPIRATORY_TRACT
  Filled 2017-10-16 (×4): qty 4

## 2017-10-16 MED ORDER — VECURONIUM BROMIDE 10 MG IV SOLR: 10.0000 mg | INTRAVENOUS | Status: DC | PRN

## 2017-10-16 MED ORDER — BUDESONIDE 0.5 MG/2ML IN SUSP
0.5000 mg | Freq: Two times a day (BID) | RESPIRATORY_TRACT | Status: DC
  Administered 2017-10-17 – 2017-10-23 (×14): 0.5 mg via RESPIRATORY_TRACT
  Filled 2017-10-16 (×16): qty 2

## 2017-10-16 MED ORDER — FUROSEMIDE 10 MG/ML IJ SOLN
40.0000 mg | Freq: Two times a day (BID) | INTRAMUSCULAR | Status: DC
  Administered 2017-10-16: 40 mg via INTRAVENOUS
  Filled 2017-10-16: qty 4

## 2017-10-16 MED ORDER — IPRATROPIUM-ALBUTEROL 0.5-2.5 (3) MG/3ML IN SOLN
3.0000 mL | RESPIRATORY_TRACT | Status: DC
  Administered 2017-10-16 – 2017-10-21 (×27): 3 mL via RESPIRATORY_TRACT
  Filled 2017-10-16 (×2): qty 3
  Filled 2017-10-16: qty 6
  Filled 2017-10-16 (×22): qty 3

## 2017-10-16 MED ORDER — ETOMIDATE 2 MG/ML IV SOLN
INTRAVENOUS | Status: AC
  Filled 2017-10-16: qty 20

## 2017-10-16 MED ORDER — ACETAZOLAMIDE SODIUM 500 MG IJ SOLR
250.0000 mg | Freq: Four times a day (QID) | INTRAMUSCULAR | Status: AC
  Administered 2017-10-16 (×3): 250 mg via INTRAVENOUS
  Filled 2017-10-16 (×3): qty 250

## 2017-10-16 MED ORDER — IPRATROPIUM-ALBUTEROL 0.5-2.5 (3) MG/3ML IN SOLN
RESPIRATORY_TRACT | Status: AC
  Administered 2017-10-16: 3 mL via RESPIRATORY_TRACT
  Filled 2017-10-16: qty 3

## 2017-10-16 MED ORDER — ARFORMOTEROL TARTRATE 15 MCG/2ML IN NEBU
15.0000 ug | INHALATION_SOLUTION | Freq: Two times a day (BID) | RESPIRATORY_TRACT | Status: DC
  Administered 2017-10-17 – 2017-10-23 (×14): 15 ug via RESPIRATORY_TRACT
  Filled 2017-10-16 (×16): qty 2

## 2017-10-16 MED ORDER — LORAZEPAM 2 MG/ML IJ SOLN: 4.0000 mg | Freq: Once | INTRAMUSCULAR | Status: DC

## 2017-10-16 MED ORDER — VITAL HIGH PROTEIN PO LIQD
1000.0000 mL | ORAL | Status: DC
  Administered 2017-10-16 – 2017-10-21 (×5): 1000 mL
  Filled 2017-10-16: qty 1000

## 2017-10-16 MED ORDER — ETOMIDATE 2 MG/ML IV SOLN
20.0000 mg | Freq: Once | INTRAVENOUS | Status: AC
  Administered 2017-10-16: 20 mg via INTRAVENOUS

## 2017-10-16 MED ORDER — ROCURONIUM BROMIDE 50 MG/5ML IV SOLN
50.0000 mg | Freq: Once | INTRAVENOUS | Status: AC
  Administered 2017-10-16: 50 mg via INTRAVENOUS

## 2017-10-16 MED ORDER — ETOMIDATE 2 MG/ML IV SOLN: 50.0000 mg | Freq: Once | INTRAVENOUS | Status: DC

## 2017-10-16 MED ORDER — FUROSEMIDE 10 MG/ML IJ SOLN
10.0000 mg/h | INTRAVENOUS | Status: DC
  Administered 2017-10-16: 10 mg/h via INTRAVENOUS
  Filled 2017-10-16: qty 21

## 2017-10-16 NOTE — Progress Notes (Addendum)
HISTORY & PHYSICAL  Patient Name: Ashley Conrad MRN: [id number] DOB: [birth year]    ADMISSION DATE:  10/06/2017 DATE OF SERVICE:  10/16/2017  CHIEF COMPLAINT: Acute hypoxemic and hypercapnic respiratory; loculated right pleural effusion   BRIEF  Ashley Conrad is 72 y.o. African-American female reformed smoker transferred to the [facility] Emergency Department via EMS from [facility] [place], [place]) with complaints of acute hypoxemic and hypercapnic respiratory failure and a loculated right pleural effusion.  I was contacted by Dr. Ardis Rowan at [facility] with a request to transfer the patient after she presented to [facility] emergency department with complaints of increasing dyspnea and increasing oxygen requirement.  She is a known COPD patient who uses to 5 LPM via nasal cannula at baseline.  Initially, the request was for higher level care in hopes of definitive treatment of a loculated right pleural effusion; however, during her ER stay at [facility], the patient clinically deteriorated and required endotracheal intubation and mechanical ventilatory support.  The patient was recently hospitalized (09/18/2017) at [facility], during which she underwent lumpectomy for ductal invasive carcinoma.  Apparently, the patient reported upon presentation to [facility] emergency department that she did not feel she had done well after her hospital discharge.  She initially presented with respiratory distress and oxygen saturation in the 70s on her 5 LPM via nasal cannula.  She also initially exhibited tachypnea and hypertension; however, as she deteriorated and required BiPAP support she became mildly hypotensive.  Trial of BiPAP therapy was initiated with IPAP 16, EPAP 10, FiO2 80%.  Follow-up ABGs demonstrated failure to improve her hypercapnic respiratory failure.  Conversation with Dr. Rozell Searing suggests that the patient was  getting intravenous Solu-Medrol along with aerosol therapies.  Further ER evaluation included chest x-ray, which raise concern for right-sided pleural effusion.  She had a CT angiogram of the chest which was negative for thromboembolic disease; however, the images were reported to be concerning for loculated pleural effusion.  The patient did transport with a CD, presumably containing CT images, which are being sent to radiology to be uploaded.  EVENTS  8/16: Presents to [facility] emergency department with complaints of increased dyspnea and increasing oxygen requirement.  Failed BiPAP support.  Intubated.  ER evaluation revealed a loculated right pleural effusion. 8/17: Transferred to [place]. [facility] ICU afte rintubation  09/27/2017 -  - on vent. STill hypercapnic. On fent gtt 14mcg -> RASS -2 overnigt. Not on pressors. AFebrile. Making urine. Arrived in ICU few hours ago. No labs yet. On 100% fiow2/peep 5 -> pulse ox 95%  10/15/17 - IR bedside thoracentesis attempt with no fluid produced. Will return for IR guided thoracentesis 8/19.    SUBJECTIVE/OVERNIGHT/INTERVAL HX 8/20 - off diprivan. INcreaed fio2 90, peep 12, sats ~94%. CXR still with bilateral effusion/consolidation/atelectasis, worse on the right. No fever. Makes urine. On Tube feeds.    VITAL SIGNS: Blood pressure (!) 135/51, pulse 86, temperature 100 F (37.8 C), temperature source Oral, resp. rate (!) 30, height 5' (1.524 m), weight 82.3 kg, SpO2 94 %. Vent Vent Mode: PRVC FiO2 (%):  [90 %-100 %] 90 % Set Rate:  [24 bmp-30 bmp] 30 bmp Vt Set:  [430 mL] 430 mL PEEP:  [12 cmH20] 12 cmH20 Plateau Pressure:  [18 cmH20-27 cmH20] 25 cmH20   INTAKE / OUTPUT:  Intake/Output Summary (Last 24 hours) at 10/16/2017 0655 Last data filed at 10/16/2017 0600 Gross per 24 hour  Intake 1975.05 ml  Output 3585 ml  Net -1609.95 ml    PHYSICAL EXAMINATION:  Constitution: acutely ill appearing, sedated HEENT:  intubated, NG tube Cardio: regular rate & rhythm, no m/r/g Respiratory: clear to auscultation  Abdominal: hypoactive BS, distended, soft  GU: foley cath in palce Neuro: sedated Skin: clean/dry/intact   LABS: Review of labs from [place] (08/16/5091 2119): Sodium 141, potassium 4.4, chloride 98, bicarbonate greater than 40, BUN 16, creatinine 0.6.  Glucose 131.  Calcium 8.7.  Protein 7.4.  Albumin 4.1.  Total bilirubin 0.4.  AST 29.  ALT 25.  Alkaline phosphatase 111.  CARDIAC ENZYMES (10/12/2017 2119): Troponin I less than 0.03.  Broke proBNP 120.  INR 1.1.  PTT 30.8.  D-dimer 510.  CBC (10/12/2017 2119): WBC 11.8 (73% neutrophils, 14% lymphocytes, 8% monocytes, 4% eosinophils, 1% basophils).  Hemoglobin 11.2, hematocrit 35.8.  MCV 97.5.  Platelets 264.  ABG (10/10/2017 0010): pH 7.18, PCO2 101, PO2 74 on BiPAP 16/10, FiO2 80% ABG (10/05/2017 0151): pH 7.10, PCO2 116, PO2 62 on AC 18/425/60/5  PULMONARY Recent Labs  Lab 10/10/2017 0513 10/22/2017 0613  10/14/17 1724 10/15/17 0111 10/15/17 0420 10/15/17 0600 10/15/17 0944 10/16/17 0330  PHART 7.285* 7.294*   < > 7.241* 7.153* 7.187* 7.267* 7.354 7.435  PCO2ART 69.8* 66.8*   < > 75.2* 98.5* 86.5* 70.0* 61.7* 53.3*  PO2ART 70.0* 77.0*   < > 88.0 82.8* 84.0 68.4* 72.0* 51.5*  HCO3 33.2* 32.5*   < > 32.3* 33.1* 31.6* 30.9* 34.5* 35.1*  TCO2 35* 35*  --  35*  --   --   --  36*  --   O2SAT 91.0 93.0   < > 94.0 93.2 94.7 92.9 93.0 86.5   < > = values in this interval not displayed.   CBC Recent Labs  Lab 10/14/17 0334 10/15/17 0339 10/16/17 0346  HGB 9.6* 9.9* 9.5*  HCT 34.7* 32.2* 33.2*  WBC 11.6* 15.9* 13.0*  PLT 223 215 220   COAGULATION No results for input(s): INR in the last 168 hours.  CARDIAC   Recent Labs  Lab 10/08/2017 0658 09/27/2017 1346 10/18/2017 2026  TROPONINI <0.03 <0.03 <0.03   No results for input(s): PROBNP in the last 168 hours.  CHEMISTRY Recent Labs  Lab  10/22/2017 0658 10/14/17 0334 10/14/17 2343 10/15/17 0339 10/15/17 0940 10/16/17 0346  NA 140  --  135  --  142 144  K 4.8  --  3.5  --  3.1* 4.3  CL 104  --  97*  --  99 101  CO2 30  --  31  --  35* 34*  GLUCOSE 170*  --  221*  --  236* 171*  BUN 13  --  42*  --  52* 63*  CREATININE 0.70  --  1.35*  --  1.52* 1.34*  CALCIUM 8.0*  --  7.4*  --  7.9* 8.5*  MG 2.3 2.2  --  2.9*  --  2.8*  PHOS 1.2* 2.9  --  5.6*  --  3.0   Estimated Creatinine Clearance: 35 mL/min (A) (by C-G formula based on SCr of 1.34 mg/dL (H)).  LIVER Recent Labs  Lab 10/03/2017 0658 10/14/17 1443 10/15/17 0339  AST 21  --   --   ALT 17  --   --   ALKPHOS 85  --   --   BILITOT 0.8  --   --   PROT 6.8  --  5.2*  ALBUMIN 3.3* 3.2*  --    INFECTIOUS Recent Labs  Lab 10/26/2017 0658 10/14/17 0334 10/15/17 0339  LATICACIDVEN 1.2  --   --   PROCALCITON <0.10 <0.10 0.11   ENDOCRINE CBG (last 3)  Recent Labs    10/15/17 1931 10/16/17 0026 10/16/17 0351  GLUCAP 162* 151* 155*   IMAGING x48h  - image(s) personally visualized  -   highlighted in bold  Dg Chest Port 1 View  Result Date: 10/16/2017 CLINICAL DATA:  Initial evaluation for endotracheal tube placement. EXAM: PORTABLE CHEST 1 VIEW COMPARISON:  Prior radiograph from 10/15/2017 FINDINGS: Endotracheal tube in place with tip position 3.7 cm above the carina. Left PICC line tip overlies the cavoatrial junction. Enteric tube courses in the abdomen. Stable cardiomegaly. Mediastinal silhouette unchanged. Aortic atherosclerosis. Persistent partially loculated right pleural effusion with associated right basilar opacity, likely atelectasis. Small left pleural effusion, slightly worsened. Dense left basilar opacity also likely atelectasis. Mild pulmonary interstitial congestion. No pneumothorax. Osseous structures unchanged. IMPRESSION: 1. Tip of endotracheal tube position 3.7 cm above the carina. 2. Similar partially loculated right pleural effusion with  associated right basilar opacity, likely atelectasis. 3. Slightly worsened left pleural effusion with associated atelectasis. Electronically Signed   By: Jeannine Boga M.D.   On: 10/16/2017 06:12   Dg Chest Port 1 View  Result Date: 10/15/2017 CLINICAL DATA:  Right thoracentesis.  No fluid removed. EXAM: PORTABLE CHEST 1 VIEW COMPARISON:  10/15/2017 FINDINGS: Small partial loculated right pleural effusion. Right basilar airspace disease likely reflecting atelectasis. Trace left pleural effusion. No pneumothorax. Endotracheal tube 4.5 cm above the carina. Nasogastric tube coursing below the diaphragm. Left-sided PICC line with the tip projecting over the SVC. Stable cardiomediastinal silhouette. IMPRESSION: 1. No right pneumothorax status post attempted thoracentesis. 2. Small partially loculated right pleural effusion. Electronically Signed   By: Kathreen Devoid   On: 10/15/2017 12:49   Dg Chest Port 1 View  Result Date: 10/15/2017 CLINICAL DATA:  Hypoxia EXAM: PORTABLE CHEST 1 VIEW COMPARISON:  10/14/2017, 10/22/2017, CT chest 10/12/2017 FINDINGS: Endotracheal tube tip about 3 cm superior to carina. Esophageal tube tip below the diaphragm but non visualized. Left upper extremity catheter tip overlies the proximal right atrium. Moderate bilateral pleural effusions with loculation and layering on the right. Cardiomegaly with vascular congestion. Dense airspace disease at both lung bases. Aortic atherosclerosis. No pneumothorax. IMPRESSION: 1. Support lines and tubes as above. 2. No significant interval change in moderate bilateral pleural effusions right greater than left with loculation and layering on the right. Continued airspace disease at both bases. 3. Cardiomegaly Electronically Signed   By: Donavan Foil M.D.   On: 10/15/2017 00:41   Dg Chest Port 1 View  Result Date: 10/14/2017 CLINICAL DATA:  Intubated and change in respiratory status. EXAM: PORTABLE CHEST 1 VIEW COMPARISON:  Earlier today  at 0457 hours FINDINGS: 1147 hours. Endotracheal tube terminates 2.4 cm above carina.Nasogastric tube extends beyond the inferior aspect of the film. Cardiomegaly accentuated by AP portable technique. Atherosclerosis in the transverse aorta. Layering right pleural effusion, moderate, similar. Tiny left pleural effusion is not significantly changed. No pneumothorax. mild pulmonary venous congestion remains. Right greater than left base airspace disease is similar. IMPRESSION: No significant change since earlier today. Cardiomegaly with mild congestive heart failure, bilateral pleural effusions, and bibasilar airspace disease. Aortic Atherosclerosis (ICD10-I70.0). Electronically Signed   By: Abigail Miyamoto M.D.   On: 10/14/2017 12:36   [place] Ekg Site Rite  Result Date: 10/14/2017 If [facility]  image not attached, placement could not be confirmed due to current cardiac rhythm.  US Thoracentesis Asp Pleural Space W/img Guide  Result Date: 10/15/2017 INDICATION: Right sided pleural effusion. History of COPD now on ventilator. Request for diagnostic thoracentesis of loculated right pleural effusion EXAM: ULTRASOUND GUIDED RIGHT THORACENTESIS MEDICATIONS: 10 mL 2% lidocaine. COMPLICATIONS: None immediate. PROCEDURE: An ultrasound guided thoracentesis was thoroughly discussed with the patient's family and questions answered. The benefits, risks, alternatives and complications were also discussed. The patient's family understands and wishes to proceed with the procedure. Written consent was obtained from daughter due to patient being sedated and intubated. Ultrasound was performed to localize and mark an adequate pocket of fluid in the right chest. The area was then prepped and draped in the normal sterile fashion. 2% Lidocaine was used for local anesthesia. Under ultrasound guidance a 6 Fr Safe-T-Centesis catheter was introduced. Thoracentesis was performed - fluid was unable to be aspirate after multiple attempts. The  catheter was removed and a dressing applied. FINDINGS: No fluid was removed during thoracentesis. IMPRESSION: Ultrasound guided right thoracentesis yielding no pleural fluid. Read by Candiss Norse, PA-C Electronically Signed   By: Marybelle Killings M.D.   On: 10/15/2017 14:29   . sodium chloride 10 mL/hr at 10/16/17 0600  . sodium chloride    . ceFEPime (MAXIPIME) IV Stopped (10/16/17 0513)  . dexmedetomidine (PRECEDEX) IV infusion 0.9 mcg/kg/hr (10/16/17 0600)  . fentaNYL infusion INTRAVENOUS 300 mcg/hr (10/16/17 0600)  . propofol (DIPRIVAN) infusion Stopped (10/15/17 1041)   ASSESSMENT / PLAN: Principal Problem:   Acute on chronic respiratory failure with hypoxia and hypercapnia (HCC) Active Problems:   Malignant neoplasm of upper-outer quadrant of right breast in female, estrogen receptor positive (HCC)   Breast cancer (HCC)   COPD exacerbation (HCC)   Loculated pleural effusion   Hypertension   Agitation requiring sedation protocol   No contraindication to deep vein thrombosis (DVT) prophylaxis   At risk for stress ulcer   At risk for hyperglycemia   History of sinus tachycardia  Acute on chronic respiratory failure with hypoxia and hypercapnia ([place]) Intubated 8/16. Mrs. Willene Conrad's ABG improved but fio2/peep needs still worsening 10/16/2017. CXR, improved, still possible volume overload. Lasix 40mg x3 yesterday. NS 122 cc/hr precedex .6 mcg/kg, fentanyl 279mcg/hr; FiO2 90%, RR 30, PEEP 12, I:E 1:1.7 Plan Maintain on full vent support  Decrease RR to 26, keep I time to 0.7 Titrate O2 for sat of 88-92% Continue diuresis, see below ABG and CXR Patient would not want longterm ventilatory support but not time for that discussion yet  COPD exacerbation (HCC) No wheezing currently due to steroid and nebs Plan: Continue solumedrol at current dose Continue BD RVP negative Active diureses  Loculated pleural effusion On right side present prior to admit. She is non-toxic and doubt  this is loculated empyema. PCT < 0.1. Beside thoracentesis yesterday with no output.  Plan: IR guided thoracentesis today for loculated pleural effusion, bedside failed yesterday will move to IR to perform today Am CBC, BMP, Mg, Phos Cefepime day 4/7 as below - vanc d/ced Maintain on cefepime for now given WBC, unsure how effective is PCT while patient is on chemotherapy F/u cxr s/p thoracentesis   Agitation requiring sedation protocol Plan: Propofol stopped Fentanyl & precedex will continue for today given vent needs  Malignant neoplasm of upper-outer quadrant of right breast in female, estrogen receptor positive ([place]) Continue arimidex opd med  AKI: Cr improved 1.52 > 1.3. ~baseline .7; BUN inc  Cont lasix 40mg  qd bid  Hypertension Currently bp ok Plan: Monitor  No contraindication to deep vein thrombosis (DVT) prophylaxis Given cancer hx Plan: Sq lovenox   At risk for stress ulcer h2 blockade  At risk for hyperglycemia icu hyperglycemia protocol  History of sinus tachycardia Takes po dilt at home. resstarted here in hospital via tube   FAMILY  - Updates:--> husband updated  - Inter-disciplinary family meet or Palliative Care meeting due by:  Day 7. Current LOS is LOS 3 days  DISPO Keep in ICU  Marty Heck, DO 10/16/2017, 7:02 AM Pager: [phone]  Attending Note:  72 year old female with recent diagnosis of breast cancer who presents to PCCM with respiratory failure, ARDS and fluid overload.  On exam, coarse BS noted diffusely with crackles.  I reviewed CXR myself, pulmonary edema noted and pleural effusion.  Will add lasix drip, diamox and zaroxolyn.  Replace electrolytes.  BMET in AM.  Adjust vent for alkalosis.  Maintain PEEP at current level.  PCCM will continue to follow.  Daughter updated at length bedside.  The patient is critically ill with multiple organ systems failure and requires high complexity decision making for assessment and support,  frequent evaluation and titration of therapies, application of advanced monitoring technologies and extensive interpretation of multiple databases.   Critical Care Time devoted to patient care services described in this note is  32  Minutes. This time reflects time of care of this signee Dr [name]. This critical care time does not reflect procedure time, or teaching time or supervisory time of PA/NP/Med student/Med Resident etc but could involve care discussion time.  Rush Farmer, M.D. [facility] Pulmonary/Critical Care Medicine. Pager: [phone]. After hours pager: [phone].

## 2017-10-16 NOTE — Procedures (Signed)
Intubation Procedure Note Ashley Conrad [id number] 1945/11/04  Procedure: Intubation Indications: Respiratory insufficiency  Procedure Details Consent: Unable to obtain consent because of emergent medical necessity. Time Out: Verified patient identification, verified procedure, site/side was marked, verified correct patient position, special equipment/implants available, medications/allergies/relevent history reviewed, required imaging and test results available.  Performed  Maximum sterile technique was used including gloves and hand hygiene.  MAC    Evaluation Hemodynamic Status: BP stable throughout; O2 sats: stable throughout Patient's Current Condition: stable Complications: No apparent complications Patient did tolerate procedure well. Chest X-ray ordered to verify placement.  CXR: pending.   Ashley Conrad 10/16/2017

## 2017-10-16 NOTE — Progress Notes (Addendum)
Nutrition Follow-up  DOCUMENTATION CODES:   Obesity unspecified  INTERVENTION:    Vital High Protein at 45 ml/h (1080 ml per day)  D/C Pro-stat   Provides 1080 kcal, 95 gm protein, 903 ml free water daily  NUTRITION DIAGNOSIS:   Inadequate oral intake related to inability to eat as evidenced by NPO status.  Ongoing  GOAL:   Provide needs based on ASPEN/SCCM guidelines  Unmet, being addressed with TF  MONITOR:   Vent status, Labs, Weight trends, I & O's, Diet advancement, TF tolerance  REASON FOR ASSESSMENT:   Consult, Ventilator Enteral/tube feeding initiation and management  ASSESSMENT:   72 y/o female PMHx Breast Cancer, HTN GERD, COPD (5L at baseline). Transferred to [facility] from OSH w/ respiratory failure and R pleural effusion. Had failed BiPAP support and required intubation. RD consulted for nutrition support.    Being treated for respiratory failure, ARDS, and fluid overload. Lasix added. Plans for IR guided thoracentesis today.  Patient remains intubated on ventilator support MV: 6.2 L/min Temp (24hrs), Avg:98.9 F (37.2 C), Min:98 F (36.7 C), Max:100 F (37.8 C)  Propofol: off Will change TF regimen to better meet nutrition needs now that Propofol is off.   OGT in place. Currently receiving Vital High Protein at 15 ml/h with Pro-stat 60 ml BID providing 760 kcal, 92 gm protein, 301 ml free water daily.   Labs reviewed.  CBG's: [phone] Medications reviewed and include solu-medrol, fentanyl, precedex, and lasix.    Diet Order:   Diet Order    None      EDUCATION NEEDS:   No education needs have been identified at this time  Skin:  Skin Assessment: Reviewed RN Assessment  Last BM:  PTA  Height:   Ht Readings from Last 1 Encounters:  10/14/17 5' (1.524 m)    Weight:   Wt Readings from Last 1 Encounters:  10/16/17 82.3 kg    Ideal Body Weight:  45.45 kg  BMI:  Body mass index is 35.43 kg/m.  Estimated Nutritional Needs:    Kcal:  [id number]  Protein:  91 gm  Fluid:  1.5-2 L    Molli Barrows, RD, LDN, [place] Pager [phone] After Hours Pager [phone]

## 2017-10-17 ENCOUNTER — Inpatient Hospital Stay (HOSPITAL_COMMUNITY): Payer: Medicare Other

## 2017-10-17 DIAGNOSIS — A419 Sepsis, unspecified organism: Secondary | ICD-10-CM

## 2017-10-17 DIAGNOSIS — I5030 Unspecified diastolic (congestive) heart failure: Secondary | ICD-10-CM

## 2017-10-17 DIAGNOSIS — J80 Acute respiratory distress syndrome: Secondary | ICD-10-CM

## 2017-10-17 DIAGNOSIS — R609 Edema, unspecified: Secondary | ICD-10-CM

## 2017-10-17 DIAGNOSIS — R6521 Severe sepsis with septic shock: Secondary | ICD-10-CM

## 2017-10-17 DIAGNOSIS — N179 Acute kidney failure, unspecified: Secondary | ICD-10-CM

## 2017-10-17 DIAGNOSIS — J90 Pleural effusion, not elsewhere classified: Secondary | ICD-10-CM

## 2017-10-17 DIAGNOSIS — J9602 Acute respiratory failure with hypercapnia: Secondary | ICD-10-CM

## 2017-10-17 DIAGNOSIS — J9601 Acute respiratory failure with hypoxia: Secondary | ICD-10-CM

## 2017-10-17 LAB — CBC WITH DIFFERENTIAL/PLATELET
BASOS PCT: 0 %
Basophils Absolute: 0 10*3/uL (ref 0.0–0.1)
Eosinophils Absolute: 0 10*3/uL (ref 0.0–0.7)
Eosinophils Relative: 0 %
HEMATOCRIT: 33.9 % — AB (ref 36.0–46.0)
Hemoglobin: 9.6 g/dL — ABNORMAL LOW (ref 12.0–15.0)
LYMPHS ABS: 0.5 10*3/uL — AB (ref 0.7–4.0)
Lymphocytes Relative: 3 %
MCH: 30.8 pg (ref 26.0–34.0)
MCHC: 28.3 g/dL — ABNORMAL LOW (ref 30.0–36.0)
MCV: 108.7 fL — ABNORMAL HIGH (ref 78.0–100.0)
MONOS PCT: 6 %
Monocytes Absolute: 1 10*3/uL (ref 0.1–1.0)
NEUTROS ABS: 15.8 10*3/uL — AB (ref 1.7–7.7)
Neutrophils Relative %: 91 %
Platelets: 220 10*3/uL (ref 150–400)
RBC: 3.12 MIL/uL — AB (ref 3.87–5.11)
RDW: 13.9 % (ref 11.5–15.5)
WBC: 17.3 10*3/uL — ABNORMAL HIGH (ref 4.0–10.5)

## 2017-10-17 LAB — PROTIME-INR
INR: 1.18
Prothrombin Time: 14.9 seconds (ref 11.4–15.2)

## 2017-10-17 LAB — BLOOD GAS, ARTERIAL
Acid-Base Excess: 8.9 mmol/L — ABNORMAL HIGH (ref 0.0–2.0)
Bicarbonate: 33.8 mmol/L — ABNORMAL HIGH (ref 20.0–28.0)
Drawn by: 511911
FIO2: 90
LHR: 30 {breaths}/min
O2 SAT: 93.2 %
PCO2 ART: 55.2 mmHg — AB (ref 32.0–48.0)
PEEP: 18 cmH2O
Patient temperature: 98.6
VT: 430 mL
pH, Arterial: 7.404 (ref 7.350–7.450)
pO2, Arterial: 72 mmHg — ABNORMAL LOW (ref 83.0–108.0)

## 2017-10-17 LAB — GRAM STAIN

## 2017-10-17 LAB — BASIC METABOLIC PANEL
Anion gap: 8 (ref 5–15)
BUN: 90 mg/dL — ABNORMAL HIGH (ref 8–23)
CO2: 34 mmol/L — ABNORMAL HIGH (ref 22–32)
Calcium: 8.3 mg/dL — ABNORMAL LOW (ref 8.9–10.3)
Chloride: 102 mmol/L (ref 98–111)
Creatinine, Ser: 1.62 mg/dL — ABNORMAL HIGH (ref 0.44–1.00)
GFR calc Af Amer: 35 mL/min — ABNORMAL LOW (ref >60)
GFR calc non Af Amer: 30 mL/min — ABNORMAL LOW (ref >60)
Glucose, Bld: 198 mg/dL — ABNORMAL HIGH (ref 70–99)
Potassium: 3.5 mmol/L (ref 3.5–5.1)
Sodium: 144 mmol/L (ref 135–145)

## 2017-10-17 LAB — BODY FLUID CELL COUNT WITH DIFFERENTIAL
Lymphs, Fluid: 10 %
Monocyte-Macrophage-Serous Fluid: 35 % — ABNORMAL LOW (ref 50–90)
NEUTROPHIL FLUID: 55 % — AB (ref 0–25)
WBC FLUID: 3208 uL — AB (ref 0–1000)

## 2017-10-17 LAB — POCT I-STAT 3, ART BLOOD GAS (G3+)
ACID-BASE EXCESS: 9 mmol/L — AB (ref 0.0–2.0)
Acid-Base Excess: 9 mmol/L — ABNORMAL HIGH (ref 0.0–2.0)
Acid-Base Excess: 8 mmol/L — ABNORMAL HIGH (ref 0.0–2.0)
BICARBONATE: 35.6 mmol/L — AB (ref 20.0–28.0)
BICARBONATE: 35.1 mmol/L — AB (ref 20.0–28.0)
BICARBONATE: 34.2 mmol/L — AB (ref 20.0–28.0)
O2 Saturation: 96 %
O2 Saturation: 96 %
O2 Saturation: 96 %
PCO2 ART: 51.9 mmHg — AB (ref 32.0–48.0)
PH ART: 7.426 (ref 7.350–7.450)
PO2 ART: 80 mmHg — AB (ref 83.0–108.0)
Patient temperature: 98.3
TCO2: 37 mmol/L — AB (ref 22–32)
TCO2: 37 mmol/L — ABNORMAL HIGH (ref 22–32)
TCO2: 36 mmol/L — ABNORMAL HIGH (ref 22–32)
pCO2 arterial: 60.5 mmHg — ABNORMAL HIGH (ref 32.0–48.0)
pCO2 arterial: 58.4 mmHg — ABNORMAL HIGH (ref 32.0–48.0)
pH, Arterial: 7.377 (ref 7.350–7.450)
pH, Arterial: 7.387 (ref 7.350–7.450)
pO2, Arterial: 85 mmHg (ref 83.0–108.0)
pO2, Arterial: 86 mmHg (ref 83.0–108.0)

## 2017-10-17 LAB — ECHOCARDIOGRAM COMPLETE
Height: 60 in
WEIGHTICAEL: 2878.33 [oz_av]

## 2017-10-17 LAB — LACTATE DEHYDROGENASE, PLEURAL OR PERITONEAL FLUID: LD, Fluid: 1018 U/L — ABNORMAL HIGH (ref 3–23)

## 2017-10-17 LAB — GLUCOSE, CAPILLARY
GLUCOSE-CAPILLARY: 186 mg/dL — AB (ref 70–99)
GLUCOSE-CAPILLARY: 162 mg/dL — AB (ref 70–99)
GLUCOSE-CAPILLARY: 190 mg/dL — AB (ref 70–99)
Glucose-Capillary: 138 mg/dL — ABNORMAL HIGH (ref 70–99)
Glucose-Capillary: 159 mg/dL — ABNORMAL HIGH (ref 70–99)
Glucose-Capillary: 204 mg/dL — ABNORMAL HIGH (ref 70–99)
Glucose-Capillary: 238 mg/dL — ABNORMAL HIGH (ref 70–99)

## 2017-10-17 LAB — PROTEIN, PLEURAL OR PERITONEAL FLUID: Total protein, fluid: 3.8 g/dL

## 2017-10-17 LAB — PHOSPHORUS: Phosphorus: 4.7 mg/dL — ABNORMAL HIGH (ref 2.5–4.6)

## 2017-10-17 LAB — CULTURE, RESPIRATORY

## 2017-10-17 LAB — CULTURE, RESPIRATORY W GRAM STAIN

## 2017-10-17 LAB — MAGNESIUM: Magnesium: 3 mg/dL — ABNORMAL HIGH (ref 1.7–2.4)

## 2017-10-17 LAB — LACTATE DEHYDROGENASE: LDH: 166 U/L (ref 98–192)

## 2017-10-17 MED ORDER — FENTANYL CITRATE (PF) 2500 MCG/50ML IJ SOLN
25.0000 ug/h | Status: DC
  Administered 2017-10-17 – 2017-10-18 (×3): 400 ug/h via INTRAVENOUS
  Administered 2017-10-20: 50 ug/h via INTRAVENOUS
  Filled 2017-10-17 (×4): qty 100

## 2017-10-17 MED ORDER — PERFLUTREN LIPID MICROSPHERE
1.0000 mL | INTRAVENOUS | Status: AC | PRN
  Administered 2017-10-17: 2 mL via INTRAVENOUS
  Filled 2017-10-17: qty 10

## 2017-10-17 MED ORDER — NOREPINEPHRINE 16 MG/250ML-% IV SOLN
0.0000 ug/min | INTRAVENOUS | Status: DC
  Administered 2017-10-17: 2 ug/min via INTRAVENOUS
  Administered 2017-10-19: 5 ug/min via INTRAVENOUS
  Filled 2017-10-17 (×3): qty 250

## 2017-10-17 MED ORDER — CHLORHEXIDINE GLUCONATE 0.12 % MT SOLN
OROMUCOSAL | Status: AC
  Filled 2017-10-17: qty 15

## 2017-10-17 MED ORDER — ENOXAPARIN SODIUM 30 MG/0.3ML ~~LOC~~ SOLN
30.0000 mg | SUBCUTANEOUS | Status: DC
  Administered 2017-10-17 – 2017-10-23 (×7): 30 mg via SUBCUTANEOUS
  Filled 2017-10-17 (×7): qty 0.3

## 2017-10-17 MED ORDER — NOREPINEPHRINE 4 MG/250ML-% IV SOLN
0.0000 ug/min | INTRAVENOUS | Status: DC
  Administered 2017-10-16: 10 ug/min via INTRAVENOUS

## 2017-10-17 NOTE — Progress Notes (Signed)
HISTORY & PHYSICAL  Patient Name: Ashley Conrad MRN: [id number] DOB: 17-Dec-1945    ADMISSION DATE:  10/12/2017 DATE OF SERVICE:  10/16/2017  CHIEF COMPLAINT: Acute hypoxemic and hypercapnic respiratory; loculated right pleural effusion  BRIEF  Ashley Conrad is 72 y.o. African-American female reformed smoker transferred to the [facility] Emergency Department via EMS from [facility] [place], [place]) with complaints of acute hypoxemic and hypercapnic respiratory failure and a loculated right pleural effusion.  I was contacted by Dr. Ardis Rowan at [facility] with a request to transfer the patient after she presented to [facility] emergency department with complaints of increasing dyspnea and increasing oxygen requirement.  She is a known COPD patient who uses to 5 LPM via nasal cannula at baseline.  Initially, the request was for higher level care in hopes of definitive treatment of a loculated right pleural effusion; however, during her ER stay at [facility], the patient clinically deteriorated and required endotracheal intubation and mechanical ventilatory support.  The patient was recently hospitalized (09/18/2017) at [facility], during which she underwent lumpectomy for ductal invasive carcinoma.  Apparently, the patient reported upon presentation to [facility] emergency department that she did not feel she had done well after her hospital discharge.  She initially presented with respiratory distress and oxygen saturation in the 70s on her 5 LPM via nasal cannula.  She also initially exhibited tachypnea and hypertension; however, as she deteriorated and required BiPAP support she became mildly hypotensive.  Trial of BiPAP therapy was initiated with IPAP 16, EPAP 10, FiO2 80%.  Follow-up ABGs demonstrated failure to improve her hypercapnic respiratory failure.  Conversation with Dr. Rozell Searing suggests that the patient was getting  intravenous Solu-Medrol along with aerosol therapies.  Further ER evaluation included chest x-ray, which raise concern for right-sided pleural effusion.  She had a CT angiogram of the chest which was negative for thromboembolic disease; however, the images were reported to be concerning for loculated pleural effusion.  The patient did transport with a CD, presumably containing CT images, which are being sent to radiology to be uploaded.  EVENTS  8/16: Presents to [facility] emergency department with complaints of increased dyspnea and increasing oxygen requirement.  Failed BiPAP support.  Intubated.  ER evaluation revealed a loculated right pleural effusion. 8/17: Transferred to [place] ICU afte rintubation  10/08/2017 -  - on vent. STill hypercapnic. On fent gtt 134mcg -> RASS -2 overnigt. Not on pressors. AFebrile. Making urine. Arrived in ICU few hours ago. No labs yet. On 100% fiow2/peep 5 -> pulse ox 95%  10/15/17 - IR bedside thoracentesis attempt with no fluid produced. Will return for IR guided thoracentesis 8/19.   8/20 - off diprivan. Increaed fio2 90, peep 12, sats ~94%. CXR still with bilateral effusion/consolidation/atelectasis, worse on the right. No fever. Makes urine. On Tube feeds..   SUBJECTIVE/OVERNIGHT/INTERVAL HX ETT replaced yesterday, desat overnight and thora was done with 1200 cc of bloody fluid removed, this AM desat and hypotension, bronch done with no mucous plugging and repeat CXR with no evidence of PTX noted.  VITAL SIGNS: Blood pressure (!) 135/51, pulse 86, temperature 100 F (37.8 C), temperature source Oral, resp. rate (!) 30, height 5' (1.524 m), weight 82.3 kg, SpO2 94 %. Vent Vent Mode: PRVC FiO2 (%):  [90 %-100 %] 100 % Set Rate:  [26 bmp-60 bmp] 30 bmp Vt Set:  [430 mL] 430 mL PEEP:  [12 [id number]  [place] Pressure:  [27 AUQ33-35 KTG25] 63 SLH73   INTAKE / OUTPUT:  Intake/Output Summary (Last 24 hours) at  10/17/2017 1011 Last data filed at 10/17/2017 0950 Gross per 24 hour  Intake 3022.56 ml  Output 3175 ml  Net -152.44 ml    PHYSICAL EXAMINATION:  Constitution: Acutely ill appearing, mild respiratory distress HEENT: Intubated, NGT in place, [place]/AT, PERRL, EOM-I and MMM Cardio: RRR, Nl S1/S2 and -M/R/G Respiratory: Decreased BS L>R Abdominal: Soft, NT, ND and hypoactive BS GU: Foley in place Neuro: Sedated but withdraws to pain Skin: clean/dry/intact   LABS: Review of labs from [place] (06/25/7679 2119): Sodium 141, potassium 4.4, chloride 98, bicarbonate greater than 40, BUN 16, creatinine 0.6.  Glucose 131.  Calcium 8.7.  Protein 7.4.  Albumin 4.1.  Total bilirubin 0.4.  AST 29.  ALT 25.  Alkaline phosphatase 111.  CARDIAC ENZYMES (10/12/2017 2119): Troponin I less than 0.03.  Broke proBNP 120.  INR 1.1.  PTT 30.8.  D-dimer 510.  CBC (10/12/2017 2119): WBC 11.8 (73% neutrophils, 14% lymphocytes, 8% monocytes, 4% eosinophils, 1% basophils).  Hemoglobin 11.2, hematocrit 35.8.  MCV 97.5.  Platelets 264.  ABG (10/21/2017 0010): pH 7.18, PCO2 101, PO2 74 on BiPAP 16/10, FiO2 80% ABG (10/04/2017 0151): pH 7.10, PCO2 116, PO2 62 on AC 18/425/60/5  PULMONARY Recent Labs  Lab 10/14/17 1724  10/15/17 0944 10/16/17 0330 10/16/17 2257 10/17/17 0108 10/17/17 0405  PHART 7.241*   < > 7.354 7.435 7.240* 7.377 7.387  PCO2ART 75.2*   < > 61.7* 53.3* 85.2* 60.5* 58.4*  PO2ART 88.0   < > 72.0* 51.5* 69.0* 85.0 86.0  HCO3 32.3*   < > 34.5* 35.1* 36.5* 35.6* 35.1*  TCO2 35*  --  36*  --  39* 37* 37*  O2SAT 94.0   < > 93.0 86.5 89.0 96.0 96.0   < > = values in this interval not displayed.   CBC Recent Labs  Lab 10/15/17 0339 10/16/17 0346 10/17/17 0352  HGB 9.9* 9.5* 9.6*  HCT 32.2* 33.2* 33.9*  WBC 15.9* 13.0* 17.3*  PLT 215 220 220   COAGULATION Recent Labs  Lab 10/17/17 0018  INR 1.18   CARDIAC   Recent Labs  Lab 10/17/2017 0658  10/05/2017 1346 10/15/2017 2026  TROPONINI <0.03 <0.03 <0.03   No results for input(s): PROBNP in the last 168 hours.  CHEMISTRY Recent Labs  Lab 10/19/2017 1572 10/14/17 0334 10/14/17 2343 10/15/17 0339 10/15/17 0940 10/16/17 0346 10/17/17 0352  NA 140  --  135  --  142 144 144  K 4.8  --  3.5  --  3.1* 4.3 3.5  CL 104  --  97*  --  99 101 102  CO2 30  --  31  --  35* 34* 34*  GLUCOSE 170*  --  221*  --  236* 171* 198*  BUN 13  --  42*  --  52* 63* 90*  CREATININE 0.70  --  1.35*  --  1.52* 1.34* 1.62*  CALCIUM 8.0*  --  7.4*  --  7.9* 8.5* 8.3*  MG 2.3 2.2  --  2.9*  --  2.8* 3.0*  PHOS 1.2* 2.9  --  5.6*  --  3.0 4.7*   Estimated Creatinine Clearance: 28.8 mL/min (A) (by C-G formula based on SCr of 1.62 mg/dL (H)).  LIVER Recent Labs  Lab 10/18/2017 0658 10/14/17 1443 10/15/17 0339 10/17/17 0018  AST 21  --   --   --  ALT 17  --   --   --   ALKPHOS 85  --   --   --   BILITOT 0.8  --   --   --   PROT 6.8  --  5.2*  --   ALBUMIN 3.3* 3.2*  --   --   INR  --   --   --  1.18   INFECTIOUS Recent Labs  Lab 10/12/2017 0658 10/14/17 0334 10/15/17 0339  LATICACIDVEN 1.2  --   --   PROCALCITON <0.10 <0.10 0.11   ENDOCRINE CBG (last 3)  Recent Labs    10/17/17 0043 10/17/17 0402 10/17/17 0711  GLUCAP 138* 186* 204*   IMAGING x48h  - image(s) personally visualized  -   highlighted in bold  Dg Chest Port 1 View  Result Date: 10/17/2017 CLINICAL DATA:  Status post bronchoscopy. EXAM: PORTABLE CHEST 1 VIEW COMPARISON:  Radiograph of same day. FINDINGS: Stable cardiomediastinal silhouette. Endotracheal and nasogastric tubes are unchanged in position. Left-sided PICC line is unchanged in position. No pneumothorax is noted. Stable bibasilar subsegmental atelectasis is noted. No significant pleural effusion is noted. Bony thorax is unremarkable. IMPRESSION: Stable support apparatus. Stable bibasilar subsegmental atelectasis. Electronically Signed   By: Marijo Conception, M.D.    On: 10/17/2017 09:04   Dg Chest Port 1 View  Result Date: 10/17/2017 CLINICAL DATA:  Post right thoracentesis last night. EXAM: PORTABLE CHEST 1 VIEW COMPARISON:  10/17/2016 FINDINGS: Endotracheal tube has tip 3.8 cm above the carina. Nasogastric tube courses into the stomach and off the inferior portion of the film as tip is not visualized. Left-sided PICC line has tip just below the cavoatrial junction. Lungs are adequately inflated demonstrate stable left base opacification likely small effusion with atelectasis. Slight worsening right base opacification likely atelectasis and small amount of pleural fluid. No evidence of pneumothorax. Cardiomediastinal silhouette and remainder of the exam is unchanged. IMPRESSION: Slight worsening opacification right base likely atelectasis with small amount of pleural fluid. Stable left base opacification likely small amount of pleural fluid and atelectasis. No pneumothorax. Tubes and lines as described. Electronically Signed   By: Marin Olp M.D.   On: 10/17/2017 08:34   Dg Chest Port 1 View  Result Date: 10/17/2017 CLINICAL DATA:  Right pleural effusion post thoracentesis EXAM: PORTABLE CHEST 1 VIEW COMPARISON:  10/16/2016, 10/15/2017, 10/14/2016 FINDINGS: Endotracheal tube tip is about 2.2 cm superior to the carina. Esophageal tube tip is below the diaphragm. Small right pleural effusion, decreased. No pneumothorax on the right. Small left pleural effusion. Consolidation at the left lung base. Stable cardiomediastinal silhouette with aortic atherosclerosis. Left upper extremity catheter tip at the cavoatrial region. Hyperlucency at the left CP angle. IMPRESSION: 1. Support lines and tubes as above. 2. Small right pleural effusion, decreased compared to prior. No right pneumothorax. 3. Small left pleural effusion. Hyperlucency at the left CP angle is questionable for pneumothorax. Attention on follow-up radiograph recommended. 4. Subsegmental atelectasis right  base. Consolidation at the left lung base, atelectasis or pneumonia Electronically Signed   By: Donavan Foil M.D.   On: 10/17/2017 00:29   Dg Chest Port 1 View  Result Date: 10/16/2017 CLINICAL DATA:  72 y/o F; desaturation tonight. History of respiratory failure. Intubated. EXAM: PORTABLE CHEST 1 VIEW COMPARISON:  10/16/2017 chest radiograph. FINDINGS: Stable cardiac silhouette given projection and technique. Endotracheal and enteric tubes are stable. Stable right PICC line tip projecting over lower SVC. Stable small to moderate bilateral effusions. Stable bibasilar opacities,  right greater than left. No pneumothorax. Bones are unremarkable. IMPRESSION: Stable right larger than left pleural effusions and associated bibasilar opacities which Conrad represent atelectasis or pneumonia. Stable lines and tubes. Electronically Signed   By: Kristine Garbe M.D.   On: 10/16/2017 22:57   Dg Chest Port 1 View  Result Date: 10/16/2017 CLINICAL DATA:  ET tube, OG tube EXAM: PORTABLE CHEST 1 VIEW COMPARISON:  10/16/2017 FINDINGS: Endotracheal tube is 2.6 cm above the carina. NG tube enters the stomach. Layering small to moderate bilateral effusions, right greater than left with bilateral lower lobe atelectasis or infiltrates, also greater on the right. Heart is normal size. IMPRESSION: Endotracheal tube and NG tube in expected position as above. Small to moderate bilateral effusions with bilateral lower lobe atelectasis or pneumonia, right greater than left. Electronically Signed   By: Rolm Baptise M.D.   On: 10/16/2017 11:43   Dg Chest Port 1 View  Result Date: 10/16/2017 CLINICAL DATA:  Initial evaluation for endotracheal tube placement. EXAM: PORTABLE CHEST 1 VIEW COMPARISON:  Prior radiograph from 10/15/2017 FINDINGS: Endotracheal tube in place with tip position 3.7 cm above the carina. Left PICC line tip overlies the cavoatrial junction. Enteric tube courses in the abdomen. Stable cardiomegaly.  Mediastinal silhouette unchanged. Aortic atherosclerosis. Persistent partially loculated right pleural effusion with associated right basilar opacity, likely atelectasis. Small left pleural effusion, slightly worsened. Dense left basilar opacity also likely atelectasis. Mild pulmonary interstitial congestion. No pneumothorax. Osseous structures unchanged. IMPRESSION: 1. Tip of endotracheal tube position 3.7 cm above the carina. 2. Similar partially loculated right pleural effusion with associated right basilar opacity, likely atelectasis. 3. Slightly worsened left pleural effusion with associated atelectasis. Electronically Signed   By: Jeannine Boga M.D.   On: 10/16/2017 06:12   Dg Chest Port 1 View  Result Date: 10/15/2017 CLINICAL DATA:  Right thoracentesis.  No fluid removed. EXAM: PORTABLE CHEST 1 VIEW COMPARISON:  10/15/2017 FINDINGS: Small partial loculated right pleural effusion. Right basilar airspace disease likely reflecting atelectasis. Trace left pleural effusion. No pneumothorax. Endotracheal tube 4.5 cm above the carina. Nasogastric tube coursing below the diaphragm. Left-sided PICC line with the tip projecting over the SVC. Stable cardiomediastinal silhouette. IMPRESSION: 1. No right pneumothorax status post attempted thoracentesis. 2. Small partially loculated right pleural effusion. Electronically Signed   By: Kathreen Devoid   On: 10/15/2017 12:49   US Thoracentesis Asp Pleural Space W/img Guide  Result Date: 10/15/2017 INDICATION: Right sided pleural effusion. History of COPD now on ventilator. Request for diagnostic thoracentesis of loculated right pleural effusion EXAM: ULTRASOUND GUIDED RIGHT THORACENTESIS MEDICATIONS: 10 mL 2% lidocaine. COMPLICATIONS: None immediate. PROCEDURE: An ultrasound guided thoracentesis was thoroughly discussed with the patient's family and questions answered. The benefits, risks, alternatives and complications were also discussed. The patient's family  understands and wishes to proceed with the procedure. Written consent was obtained from daughter due to patient being sedated and intubated. Ultrasound was performed to localize and mark an adequate pocket of fluid in the right chest. The area was then prepped and draped in the normal sterile fashion. 2% Lidocaine was used for local anesthesia. Under ultrasound guidance a 6 Fr Safe-T-Centesis catheter was introduced. Thoracentesis was performed - fluid was unable to be aspirate after multiple attempts. The catheter was removed and a dressing applied. FINDINGS: No fluid was removed during thoracentesis. IMPRESSION: Ultrasound guided right thoracentesis yielding no pleural fluid. Read by Candiss Norse, PA-C Electronically Signed   By: Marybelle Killings M.D.   On: 10/15/2017  14:29   . sodium chloride Stopped (10/17/17 0857)  . sodium chloride    . ceFEPime (MAXIPIME) IV Stopped (10/17/17 7017)  . dexmedetomidine (PRECEDEX) IV infusion 1.2 mcg/kg/hr (10/17/17 0950)  . feeding supplement (VITAL HIGH PROTEIN) 1,000 mL (10/16/17 1347)  . fentaNYL infusion INTRAVENOUS    . furosemide (LASIX) infusion 10 mg/hr (10/17/17 0800)  . norepinephrine (LEVOPHED) Adult infusion 2 mcg/min (10/17/17 0900)  . propofol (DIPRIVAN) infusion Stopped (10/15/17 1041)   ASSESSMENT / PLAN: Principal Problem:   Acute on chronic respiratory failure with hypoxia and hypercapnia (HCC) Active Problems:   Malignant neoplasm of upper-outer quadrant of right breast in female, estrogen receptor positive (HCC)   Breast cancer (HCC)   COPD exacerbation (HCC)   Loculated pleural effusion   Hypertension   Agitation requiring sedation protocol   No contraindication to deep vein thrombosis (DVT) prophylaxis   At risk for stress ulcer   At risk for hyperglycemia   History of sinus tachycardia  Acute on chronic respiratory failure with hypoxia and hypercapnia ([place]) Intubated 8/16. S/p right thoracentesis w/ acute hypercapnea o/n,  improved with increased RR 30, FiO2 100%. CXR showing improvement of right pleural effusion.  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 Collected micro resp.  precedex 1 mcg/kg/hr, fentanyl 434mcg/hr; Now FiO2 90%, RR 30, PEEP 12, I:E 1:1.7 Plan Increase PEEP to 18 and titrate O2 for sats Continue deep sedation Bronch today confirmed no mucous plugging No PTX on CXR, no need for CT Culture, cytology, gram stain right bronch wash Echo pending  Hold diureses for today given hypotension Patient would not want longterm ventilatory support, will need to discuss trach options with family  COPD exacerbation ([place]) No wheezing currently due to steroid and nebs Plan: Continue solumedrol at current dose Pulmicort, brovana nebs BID, duonebs q4h RVP negative Lasix on hold given hypotension  Loculated pleural effusion Bedside right-sided thoracentesis o/n with 1200cc bloody pleural fluid obtained. Negative pleural fluid gram stain.  PCT < 0.1.  Plan: Pleural fluid pH pending - send out lab that likely will be unreliable due to sitting too long Pleural culture and cytology pending  Am CBC, BMP, Mg, Phos Cefepime day 5/7 as below - vanc d/ced Maintain on cefepime for now given WBC, PCT is not effected with anything but leukemia and lymphoma so likely effective here. F/u cxr s/p thoracentesis   Agitation requiring sedation protocol Plan: Propofol stopped Fentanyl & precedex will continue for today given vent needs  Malignant neoplasm of upper-outer quadrant of right breast in female, estrogen receptor positive ([place]) Continue arimidex opd med  AKI: Cr 1.6 from 1.3. ~baseline .7; BUN inc   Hypertension Monitor  No contraindication to deep vein thrombosis (DVT) prophylaxis Given cancer hx  Sq lovenox   At risk for stress ulcer h2 blockade  At risk for hyperglycemia icu hyperglycemia protocol  History of sinus tachycardia Takes po dilt at home.  resstarted here in hospital via tube   FAMILY  - Updates:--> husband updated, will need to get family together for discussion regarding plan of care, today is day 6 of intubation.  - Inter-disciplinary family meet or Palliative Care meeting due by:  Day 7. Current LOS is LOS 4 days  DISPO Keep in ICU  The patient is critically ill with multiple organ systems failure and requires high complexity decision making for assessment and support, frequent evaluation and titration of therapies, application of advanced monitoring technologies and extensive interpretation of multiple databases.  Critical Care Time devoted to patient care services described in this note is  60  Minutes. This time reflects time of care of this signee Dr [name]. This critical care time does not reflect procedure time, or teaching time or supervisory time of PA/NP/Med student/Med Resident etc but could involve care discussion time.  Rush Farmer, M.D. [facility] Pulmonary/Critical Care Medicine. Pager: [phone]. After hours pager: [phone].

## 2017-10-17 NOTE — Progress Notes (Signed)
200 mls Fentanyl wasted and witnessed by April Lewis RN

## 2017-10-17 NOTE — Progress Notes (Signed)
Pox staying in mid-90's on 14 PEEP, 90% FIO2. ABG shows PaO2 86 on these settings. Will decrease PEEP to 12.

## 2017-10-17 NOTE — Progress Notes (Signed)
Thoracentesis lab work pH sent to the lab per MD Hammonds verbal order.

## 2017-10-17 NOTE — Progress Notes (Signed)
CPT done via Bed, patient tolerated it fairly well.

## 2017-10-17 NOTE — Procedures (Signed)
Bronchoscopy Procedure Note Ashley Conrad [id number] 08-20-1945  Procedure: Bronchoscopy Indications: Obtain specimens for culture and/or other diagnostic studies and Remove secretions  Procedure Details Consent: Unable to obtain consent because of emergent medical necessity. Time Out: Verified patient identification, verified procedure, site/side was marked, verified correct patient position, special equipment/implants available, medications/allergies/relevent history reviewed, required imaging and test results available.  Performed  In preparation for procedure, patient was given 100% FiO2 and bronchoscope lubricated. Sedation: Benzodiazepines and Fentanyl  Airway entered and the following bronchi were examined: RUL, RML, RLL, LUL, LLL and Bronchi.   Purulent secretions L>R removed and airway cleared. Bronchoscope removed.  , Patient placed back on 100% FiO2 at conclusion of procedure.    Evaluation Hemodynamic Status: BP stable throughout; O2 sats: stable throughout Patient's Current Condition: stable Specimens:  Sent purulent fluid Complications: No apparent complications Patient did tolerate procedure well.   [name] 10/17/2017

## 2017-10-17 NOTE — Progress Notes (Signed)
Reviewed repeat ABG, hypercapnea resolved, now only her chronic compensated hypercapnea. Hypoxia also much improved. Will decrease PEEP form 16 to 14 on the vent. Repeat ABG at 5am.

## 2017-10-17 NOTE — Progress Notes (Signed)
I was called emergently to patient's bedside by [facility] to evaluate her given worsening hypoxia. On exam patient being bagged by RT with Pox 84-86% with fair waveform. Breath sounds decreased bilaterally anteriorly, faint expiratory wheezing bilaterally posteriorly. Small amount thin blood tinged secretions on suctioning of ETT. Placed patient back on the vent on her prior vent settings which were PRVC 100% FIO2, 16 PEEP, RR 26 (breathing over that rate at 30), Vt 430. Pox still in mid 80's. Intermittently low tidal volumes. High peak pressure but is not biting ETT. Again suctioned ETT and no mucous plugs. Concerned that she is in COPD exacerbation on top of severe baseline COPD and now moderate-to-large right-sided pleural effusion. CXR obtained and on my review showed ETT in good position, no infiltrates, no pneumothorax. Right-sided effusion and associated compressive atelectasis appear unchanged in comparison to prior.   A/P: - in short term, increased sedation in order to improve oxygenation. After sedation was increased, required levophed infusion. If oxygenation cannot be improved may need paralytic but want to better sedate more first.  - she is on symbicort, incruse, and albuterol at home; currently is only receiving solumedrol here. Will add pulmicort and brovana nebs BID and duonebs q4hrs - although I do not see any signs of mucous plugging on exam or signs of it on CXR, cannot rule out the possibility. Will start chest PT q4hrs with Mucomyst nebs. Re-eval need for this if she improved from other interventions - CTA Chest from 10/16 on my review shows no PE. However, she is theoretically hypercoagulable given her breast cancer and is at higher risk for PE. Will check BLE dopplers to r/o DVT. Will order TTE. - Obtained ABG which showed pH 7.24 / pCO2 85 / paO2 69 / HCO3 36 (this on the vent settings listed above). Increased RR from 26 to 30 since we are going to sedate her further which may decrease  her own respiratory drive. Do not appreciate signs of air-trapping on vent. - Performed ultrasound-guided right-sided thoracentesis, yielding 1200cc of dark red frankly bloody pleural fluid. Patient tolerated the procedure well. I am highly suspicious that this may represent a malignant pleural effusion. Sent fluid for cytology, culture, cell count/diff, total protein, LDH, pH.  - CXR post procedure on my review shows resolution of the pleural effusion; no pneumothorax. - repeat ABG at 1am.   60 minutes nonprocedural critical care time  Vernie Murders, MD Pulmonary & Critical Care Medicine Pager: [phone]

## 2017-10-17 NOTE — Progress Notes (Signed)
Called lab, pH of the pleural fluid has been pending for 2 hours now. The lab staff said this is a send out test to [facility]. I spoke to lab supervisor who confirmed this. The pH will be artificially too low after sitting for several hours to days to get a send-out pH, rendering the test useless. However it appears I have no other options to do the test in house at this time.

## 2017-10-17 NOTE — Progress Notes (Signed)
Bilateral lower extremity venous duplex has been completed. Negative for DVT. Results were given to the patient's nurse, Silva Bandy.  10/17/17 10:01 AM Ashley Conrad RVT

## 2017-10-17 NOTE — Procedures (Addendum)
Thoracentesis Procedure Note  Pre-operative Diagnosis: Right-sided Pleural effusion  Post-operative Diagnosis: same  Indications: Diagnostic and therapeutic  Procedure Details  Consent: Informed consent was obtained. Risks of the procedure were discussed including: infection, bleeding, pain, pneumothorax.  Under sterile conditions the patient was positioned. Betadine solution and sterile drapes were utilized.  1% plain (7cc) lidocaine was used to anesthetize the rib space. Ultrasound guidance used and large amount of pleural fluid visualized on ultrasound. Needle visualized on ultrasound entering the pleural space. Fluid was obtained without any difficulties and minimal blood loss.  A dressing was applied to the wound and wound care instructions were provided.   Findings 1200 ml of frankly bloody pleural fluid was obtained. A sample was sent to Pathology for cytogenetics, flow, and cell counts, as well as for infection analysis.  Complications:  None; patient tolerated the procedure well.          Condition: stable  Plan A follow up chest x-ray was ordered. Tylenol 650 mg. for pain.  Attending Attestation: I performed the procedure.  Vernie Murders, MD Pulmonary & Critical Care Medicine Pager: [phone]

## 2017-10-17 NOTE — Progress Notes (Signed)
Noted BBS to ausculation Markedly diminished with decrease aeration throughout, little to no chest excursion. Near absent BBS. MD Elink aware of my findings.

## 2017-10-17 NOTE — Progress Notes (Signed)
Patient with decreased sats and blood pressure.  lasix infusion paused levophed started RT and Dr Nelda Marseille called to bedside.

## 2017-10-17 NOTE — Progress Notes (Signed)
  Echocardiogram 2D Echocardiogram with definity has been performed.  Darlina Sicilian M 10/17/2017, 7:44 AM

## 2017-10-17 NOTE — Progress Notes (Signed)
Spoke with family extensively today, met with 10 family members, had a long discussion regarding plan of care and what is appropriate in patient care.  Informed them that we are still waiting on cytology from the pleural fluid but that I am very concerned about her ability to survive this admission since she had multiple episodes of desaturation and hypotension almost at a daily bases.    After a long discussion, the family asked for time to talk amongst themselves.    Family spoke amongst themselves, 1 hour later they decided on no CPR/cardioversion.  Informed by bedside RN.   Will place no CPR/cardioversion orders in.  The patient is critically ill with multiple organ systems failure and requires high complexity decision making for assessment and support, frequent evaluation and titration of therapies, application of advanced monitoring technologies and extensive interpretation of multiple databases.   Critical Care Time devoted to patient care services described in this note is  60  Minutes. This time reflects time of care of this signee Dr [name]. This critical care time does not reflect procedure time, or teaching time or supervisory time of PA/NP/Med student/Med Resident etc but could involve care discussion time.  Rush Farmer, M.D. [facility] Pulmonary/Critical Care Medicine. Pager: [phone]. After hours pager: [phone].

## 2017-10-17 NOTE — Progress Notes (Signed)
Called to the bedside due to vent excessively alarming and patient desaturation. On arrival noted SATs are 82% on 100% and 16cmh2o Peep. Pt volumes on the vent was 180-234ml. Which is significantly low. Also noted PIP in 50's and little to no chest excursion with each vent delivered respiration. Patient was immediately taken off the vent due to hemodynamic instability at this point and time, heart rate was declining in the 50's and SATs were declining in the upper 70's. Patient was manually ventilated with a peep valve at 78cm2o. Tolerating it well. SATs began to gradually improved overtime with manual ventilation, and also heart rate began to increase to baseline which has been 90's-100's throughout the night. MD called to the bedside to assess,  RRT will continue to monitor patient throughout the night.

## 2017-10-17 NOTE — Progress Notes (Addendum)
HISTORY & PHYSICAL  Patient Name: Ashley Conrad MRN: [id number] DOB: 06-26-1945    ADMISSION DATE:  10/17/2017 DATE OF SERVICE:  10/16/2017  CHIEF COMPLAINT: Acute hypoxemic and hypercapnic respiratory; loculated right pleural effusion   BRIEF  [name] is 72 y.o. African-American female reformed smoker transferred to the [facility] Emergency Department via EMS from [facility] [place], [place]) with complaints of acute hypoxemic and hypercapnic respiratory failure and a loculated right pleural effusion.  I was contacted by Dr. Ardis Rowan at [facility] with a request to transfer the patient after she presented to [facility] emergency department with complaints of increasing dyspnea and increasing oxygen requirement.  She is a known COPD patient who uses to 5 LPM via nasal cannula at baseline.  Initially, the request was for higher level care in hopes of definitive treatment of a loculated right pleural effusion; however, during her ER stay at [facility], the patient clinically deteriorated and required endotracheal intubation and mechanical ventilatory support.  The patient was recently hospitalized (09/18/2017) at [facility], during which she underwent lumpectomy for ductal invasive carcinoma.  Apparently, the patient reported upon presentation to [facility] emergency department that she did not feel she had done well after her hospital discharge.  She initially presented with respiratory distress and oxygen saturation in the 70s on her 5 LPM via nasal cannula.  She also initially exhibited tachypnea and hypertension; however, as she deteriorated and required BiPAP support she became mildly hypotensive.  Trial of BiPAP therapy was initiated with IPAP 16, EPAP 10, FiO2 80%.  Follow-up ABGs demonstrated failure to improve her hypercapnic respiratory failure.  Conversation with Dr. Rozell Searing suggests that the patient was  getting intravenous Solu-Medrol along with aerosol therapies.  Further ER evaluation included chest x-ray, which raise concern for right-sided pleural effusion.  She had a CT angiogram of the chest which was negative for thromboembolic disease; however, the images were reported to be concerning for loculated pleural effusion.  The patient did transport with a CD, presumably containing CT images, which are being sent to radiology to be uploaded.  EVENTS  8/16: Presents to [facility] emergency department with complaints of increased dyspnea and increasing oxygen requirement.  Failed BiPAP support.  Intubated.  ER evaluation revealed a loculated right pleural effusion. 8/17: Transferred to [place] ICU afte rintubation  10/02/2017 -  - on vent. STill hypercapnic. On fent gtt 153mcg -> RASS -2 overnigt. Not on pressors. AFebrile. Making urine. Arrived in ICU few hours ago. No labs yet. On 100% fiow2/peep 5 -> pulse ox 95%  10/15/17 - IR bedside thoracentesis attempt with no fluid produced. Will return for IR guided thoracentesis 8/19.   8/20 - off diprivan. Increaed fio2 90, peep 12, sats ~94%. CXR still with bilateral effusion/consolidation/atelectasis, worse on the right. No fever. Makes urine. On Tube feeds..   SUBJECTIVE/OVERNIGHT/INTERVAL HX 8/21 - ET replaced yesterday. Bedside thoracentesis o/n with 1200cc frank bloody pleural fluid. Further sedated o/n with RR 26 > 30 due to increasing hypoxia & acidosis, levo added to support. Able to stop levo. Now on 90% fio2/PEEP 12 w/sats @ 95%.     VITAL SIGNS: Blood pressure (!) 135/51, pulse 86, temperature 100 F (37.8 C), temperature source Oral, resp. rate (!) 30, height 5' (1.524 m), weight 82.3 kg, SpO2 94 %. Vent Vent Mode: PRVC FiO2 (%):  [90 %-100 %] 90 % Set Rate:  [26 bmp-30 bmp] 30  bmp Vt Set:  [430 mL] 430 mL PEEP:  [12 cmH20-16 cmH20] 12 cmH20 Plateau Pressure:  [26 cmH20-29 cmH20] 28 cmH20   INTAKE /  OUTPUT:  Intake/Output Summary (Last 24 hours) at 10/17/2017 0646 Last data filed at 10/17/2017 0600 Gross per 24 hour  Intake 2310.74 ml  Output 2525 ml  Net -214.26 ml    PHYSICAL EXAMINATION:  Constitution: acutely ill appearing, sedated HEENT: intubated, NG tube Cardio: regular rate & rhythm, no m/r/g Respiratory: clear to auscultation  Abdominal: hypoactive BS, distended, soft  GU: foley cath in palce Neuro: sedated Skin: clean/dry/intact   LABS: Review of labs from [place] (06/20/5359 2119): Sodium 141, potassium 4.4, chloride 98, bicarbonate greater than 40, BUN 16, creatinine 0.6.  Glucose 131.  Calcium 8.7.  Protein 7.4.  Albumin 4.1.  Total bilirubin 0.4.  AST 29.  ALT 25.  Alkaline phosphatase 111.  CARDIAC ENZYMES (10/12/2017 2119): Troponin I less than 0.03.  Broke proBNP 120.  INR 1.1.  PTT 30.8.  D-dimer 510.  CBC (10/12/2017 2119): WBC 11.8 (73% neutrophils, 14% lymphocytes, 8% monocytes, 4% eosinophils, 1% basophils).  Hemoglobin 11.2, hematocrit 35.8.  MCV 97.5.  Platelets 264.  ABG (10/11/2017 0010): pH 7.18, PCO2 101, PO2 74 on BiPAP 16/10, FiO2 80% ABG (10/26/2017 0151): pH 7.10, PCO2 116, PO2 62 on AC 18/425/60/5  PULMONARY Recent Labs  Lab 10/14/17 1724  10/15/17 0944 10/16/17 0330 10/16/17 2257 10/17/17 0108 10/17/17 0405  PHART 7.241*   < > 7.354 7.435 7.240* 7.377 7.387  PCO2ART 75.2*   < > 61.7* 53.3* 85.2* 60.5* 58.4*  PO2ART 88.0   < > 72.0* 51.5* 69.0* 85.0 86.0  HCO3 32.3*   < > 34.5* 35.1* 36.5* 35.6* 35.1*  TCO2 35*  --  36*  --  39* 37* 37*  O2SAT 94.0   < > 93.0 86.5 89.0 96.0 96.0   < > = values in this interval not displayed.   CBC Recent Labs  Lab 10/15/17 0339 10/16/17 0346 10/17/17 0352  HGB 9.9* 9.5* 9.6*  HCT 32.2* 33.2* 33.9*  WBC 15.9* 13.0* 17.3*  PLT 215 220 220   COAGULATION Recent Labs  Lab 10/17/17 0018  INR 1.18    CARDIAC   Recent Labs  Lab 10/17/2017 0658  10/15/2017 1346 10/02/2017 2026  TROPONINI <0.03 <0.03 <0.03   No results for input(s): PROBNP in the last 168 hours.  CHEMISTRY Recent Labs  Lab 10/20/2017 4431 10/14/17 0334 10/14/17 2343 10/15/17 0339 10/15/17 0940 10/16/17 0346 10/17/17 0352  NA 140  --  135  --  142 144 144  K 4.8  --  3.5  --  3.1* 4.3 3.5  CL 104  --  97*  --  99 101 102  CO2 30  --  31  --  35* 34* 34*  GLUCOSE 170*  --  221*  --  236* 171* 198*  BUN 13  --  42*  --  52* 63* 90*  CREATININE 0.70  --  1.35*  --  1.52* 1.34* 1.62*  CALCIUM 8.0*  --  7.4*  --  7.9* 8.5* 8.3*  MG 2.3 2.2  --  2.9*  --  2.8* 3.0*  PHOS 1.2* 2.9  --  5.6*  --  3.0 4.7*   Estimated Creatinine Clearance: 28.8 mL/min (A) (by C-G formula based on SCr of 1.62 mg/dL (H)).  LIVER Recent Labs  Lab 10/03/2017 0658 10/14/17 1443 10/15/17 0339 10/17/17 0018  AST 21  --   --   --  ALT 17  --   --   --   ALKPHOS 85  --   --   --   BILITOT 0.8  --   --   --   PROT 6.8  --  5.2*  --   ALBUMIN 3.3* 3.2*  --   --   INR  --   --   --  1.18   INFECTIOUS Recent Labs  Lab 09/28/2017 0658 10/14/17 0334 10/15/17 0339  LATICACIDVEN 1.2  --   --   PROCALCITON <0.10 <0.10 0.11   ENDOCRINE CBG (last 3)  Recent Labs    10/16/17 1911 10/17/17 0043 10/17/17 0402  GLUCAP 195* 138* 186*   IMAGING x48h  - image(s) personally visualized  -   highlighted in bold  Dg Chest Port 1 View  Result Date: 10/17/2017 CLINICAL DATA:  Right pleural effusion post thoracentesis EXAM: PORTABLE CHEST 1 VIEW COMPARISON:  10/16/2016, 10/15/2017, 10/14/2016 FINDINGS: Endotracheal tube tip is about 2.2 cm superior to the carina. Esophageal tube tip is below the diaphragm. Small right pleural effusion, decreased. No pneumothorax on the right. Small left pleural effusion. Consolidation at the left lung base. Stable cardiomediastinal silhouette with aortic atherosclerosis. Left upper extremity catheter tip at the cavoatrial region. Hyperlucency at the left CP  angle. IMPRESSION: 1. Support lines and tubes as above. 2. Small right pleural effusion, decreased compared to prior. No right pneumothorax. 3. Small left pleural effusion. Hyperlucency at the left CP angle is questionable for pneumothorax. Attention on follow-up radiograph recommended. 4. Subsegmental atelectasis right base. Consolidation at the left lung base, atelectasis or pneumonia Electronically Signed   By: Donavan Foil M.D.   On: 10/17/2017 00:29   Dg Chest Port 1 View  Result Date: 10/16/2017 CLINICAL DATA:  72 y/o F; desaturation tonight. History of respiratory failure. Intubated. EXAM: PORTABLE CHEST 1 VIEW COMPARISON:  10/16/2017 chest radiograph. FINDINGS: Stable cardiac silhouette given projection and technique. Endotracheal and enteric tubes are stable. Stable right PICC line tip projecting over lower SVC. Stable small to moderate bilateral effusions. Stable bibasilar opacities, right greater than left. No pneumothorax. Bones are unremarkable. IMPRESSION: Stable right larger than left pleural effusions and associated bibasilar opacities which Conrad represent atelectasis or pneumonia. Stable lines and tubes. Electronically Signed   By: Kristine Garbe M.D.   On: 10/16/2017 22:57   Dg Chest Port 1 View  Result Date: 10/16/2017 CLINICAL DATA:  ET tube, OG tube EXAM: PORTABLE CHEST 1 VIEW COMPARISON:  10/16/2017 FINDINGS: Endotracheal tube is 2.6 cm above the carina. NG tube enters the stomach. Layering small to moderate bilateral effusions, right greater than left with bilateral lower lobe atelectasis or infiltrates, also greater on the right. Heart is normal size. IMPRESSION: Endotracheal tube and NG tube in expected position as above. Small to moderate bilateral effusions with bilateral lower lobe atelectasis or pneumonia, right greater than left. Electronically Signed   By: Rolm Baptise M.D.   On: 10/16/2017 11:43   Dg Chest Port 1 View  Result Date: 10/16/2017 CLINICAL DATA:   Initial evaluation for endotracheal tube placement. EXAM: PORTABLE CHEST 1 VIEW COMPARISON:  Prior radiograph from 10/15/2017 FINDINGS: Endotracheal tube in place with tip position 3.7 cm above the carina. Left PICC line tip overlies the cavoatrial junction. Enteric tube courses in the abdomen. Stable cardiomegaly. Mediastinal silhouette unchanged. Aortic atherosclerosis. Persistent partially loculated right pleural effusion with associated right basilar opacity, likely atelectasis. Small left pleural effusion, slightly worsened. Dense left basilar opacity also likely atelectasis.  Mild pulmonary interstitial congestion. No pneumothorax. Osseous structures unchanged. IMPRESSION: 1. Tip of endotracheal tube position 3.7 cm above the carina. 2. Similar partially loculated right pleural effusion with associated right basilar opacity, likely atelectasis. 3. Slightly worsened left pleural effusion with associated atelectasis. Electronically Signed   By: Jeannine Boga M.D.   On: 10/16/2017 06:12   Dg Chest Port 1 View  Result Date: 10/15/2017 CLINICAL DATA:  Right thoracentesis.  No fluid removed. EXAM: PORTABLE CHEST 1 VIEW COMPARISON:  10/15/2017 FINDINGS: Small partial loculated right pleural effusion. Right basilar airspace disease likely reflecting atelectasis. Trace left pleural effusion. No pneumothorax. Endotracheal tube 4.5 cm above the carina. Nasogastric tube coursing below the diaphragm. Left-sided PICC line with the tip projecting over the SVC. Stable cardiomediastinal silhouette. IMPRESSION: 1. No right pneumothorax status post attempted thoracentesis. 2. Small partially loculated right pleural effusion. Electronically Signed   By: Kathreen Devoid   On: 10/15/2017 12:49   US Thoracentesis Asp Pleural Space W/img Guide  Result Date: 10/15/2017 INDICATION: Right sided pleural effusion. History of COPD now on ventilator. Request for diagnostic thoracentesis of loculated right pleural effusion EXAM:  ULTRASOUND GUIDED RIGHT THORACENTESIS MEDICATIONS: 10 mL 2% lidocaine. COMPLICATIONS: None immediate. PROCEDURE: An ultrasound guided thoracentesis was thoroughly discussed with the patient's family and questions answered. The benefits, risks, alternatives and complications were also discussed. The patient's family understands and wishes to proceed with the procedure. Written consent was obtained from daughter due to patient being sedated and intubated. Ultrasound was performed to localize and mark an adequate pocket of fluid in the right chest. The area was then prepped and draped in the normal sterile fashion. 2% Lidocaine was used for local anesthesia. Under ultrasound guidance a 6 Fr Safe-T-Centesis catheter was introduced. Thoracentesis was performed - fluid was unable to be aspirate after multiple attempts. The catheter was removed and a dressing applied. FINDINGS: No fluid was removed during thoracentesis. IMPRESSION: Ultrasound guided right thoracentesis yielding no pleural fluid. Read by Candiss Norse, PA-C Electronically Signed   By: Marybelle Killings M.D.   On: 10/15/2017 14:29   . sodium chloride 10 mL/hr at 10/17/17 0045  . sodium chloride    . ceFEPime (MAXIPIME) IV 2 g (10/17/17 0606)  . dexmedetomidine (PRECEDEX) IV infusion 1 mcg/kg/hr (10/17/17 0501)  . feeding supplement (VITAL HIGH PROTEIN) 1,000 mL (10/16/17 1347)  . fentaNYL infusion INTRAVENOUS 400 mcg/hr (10/17/17 0501)  . furosemide (LASIX) infusion 10 mg/hr (10/17/17 0612)  . norepinephrine (LEVOPHED) Adult infusion Stopped (10/17/17 0200)  . propofol (DIPRIVAN) infusion Stopped (10/15/17 1041)   ASSESSMENT / PLAN: Principal Problem:   Acute on chronic respiratory failure with hypoxia and hypercapnia (HCC) Active Problems:   Malignant neoplasm of upper-outer quadrant of right breast in female, estrogen receptor positive (HCC)   Breast cancer (HCC)   COPD exacerbation (HCC)   Loculated pleural effusion   Hypertension    Agitation requiring sedation protocol   No contraindication to deep vein thrombosis (DVT) prophylaxis   At risk for stress ulcer   At risk for hyperglycemia   History of sinus tachycardia  Acute on chronic respiratory failure with hypoxia and hypercapnia ([place]) Intubated 8/16. S/p right thoracentesis w/ acute hypercapnea o/n, improved with increased RR 30, FiO2 100%. CXR showing improvement of right pleural effusion.  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 Collected micro resp.  precedex 1 mcg/kg/hr, fentanyl 442mcg/hr; Now FiO2 90%, RR 30, PEEP 12, I:E 1:1.7 Plan Increased PEEP 18, FiO2 100, RR 30,  I time to .7 Cont. Deep sedation Am cxr w/o PTX - stat cxr post bronch Culture, cytology, gram stain right bronch wash Echo pending  Lasix held Patient would not want longterm ventilatory support but not time for that discussion yet   COPD exacerbation (HCC) No wheezing currently due to steroid and nebs Plan: Continue solumedrol at current dose Pulmicort, brovana nebs BID, duonebs q4h RVP negative Lasix held  Loculated pleural effusion Bedside right-sided thoracentesis o/n with 1200cc bloody pleural fluid obtained. Negative pleural fluid gram stain.  PCT < 0.1.  Plan: Pleural fluid pH pending - send out lab that likely will be unreliable due to sitting too long Pleural culture pending  Am CBC, BMP, Mg, Phos Cefepime day 5/7 as below - vanc d/ced Maintain on cefepime for now given WBC, unsure how effective is PCT while patient is on chemotherapy F/u cxr s/p thoracentesis   Agitation requiring sedation protocol Plan: Propofol stopped Fentanyl & precedex will continue for today given vent needs  Malignant neoplasm of upper-outer quadrant of right breast in female, estrogen receptor positive ([place]) Continue arimidex opd med  AKI: Cr 1.6 from 1.3. ~baseline .7; BUN inc    Hypertension Monitor  No contraindication to deep vein thrombosis (DVT)  prophylaxis Given cancer hx  Sq lovenox   At risk for stress ulcer h2 blockade  At risk for hyperglycemia icu hyperglycemia protocol  History of sinus tachycardia Takes po dilt at home. resstarted here in hospital via tube   FAMILY  - Updates:--> husband updated  - Inter-disciplinary family meet or Palliative Care meeting due by:  Day 7. Current LOS is LOS 4 days  DISPO Keep in ICU  Marty Heck, DO 10/17/2017, 6:46 AM Pager: [phone]

## 2017-10-17 NOTE — Progress Notes (Signed)
Pharmacy Antibiotic Note  CAYMAN BROGDEN is a 72 y.o. female admitted on 10/12/2017 with VDRF, COPD exacerbation, possible HCAP.  Pharmacy has been consulted Cefepime. AKI with Scr 1.6. CrCL <30.  WBC elevated 17.3, afebrile, PCT 0.11  Plan: Cefepime 2g IV q24h due to renal function Monitor clinical progress, c/s, renal function F/u de-escalation plan/LOT,    Height: 5' (152.4 cm) Weight: 179 lb 14.3 oz (81.6 kg) IBW/kg (Calculated) : 45.5  Temp (24hrs), Avg:98.7 F (37.1 C), Min:98.3 F (36.8 C), Max:99.1 F (37.3 C)  Recent Labs  Lab 10/18/2017 0658 10/14/17 0334 10/14/17 2343 10/15/17 0339 10/15/17 0940 10/16/17 0346 10/17/17 0352  WBC 10.4 11.6*  --  15.9*  --  13.0* 17.3*  CREATININE 0.70  --  1.35*  --  1.52* 1.34* 1.62*  LATICACIDVEN 1.2  --   --   --   --   --   --     Estimated Creatinine Clearance: 28.8 mL/min (A) (by C-G formula based on SCr of 1.62 mg/dL (H)).    Allergies  Allergen Reactions  . Cyclobenzaprine Other (See Comments)    Cognitive issues.  . Shellfish Allergy Itching    Itching    Antimicrobials this admission: 8/17 cefepime>> 8/17 levo>>8/17 8/17 vanc>>8/18  Dose adjustments this admission: 8/19 Cefepime 2g q8h >> 2g q24h  Microbiology results: 8/17 strep pneumo: neg 8/17 mrsa pcr: neg 8/17 BCx: 8/17 resp panel: neg 8/18 TA: rare GNR, GPC 8/21 sputum  Thank you for allowing pharmacy to be a part of this patient's care.  Harrietta Guardian, PharmD PGY1 Pharmacy Resident Direct Phone [phone] 10/17/2017    12:24 PM

## 2017-10-17 NOTE — Progress Notes (Signed)
Thoracentesis procedure in progress, MD Hammonds at bedside.

## 2017-10-17 NOTE — Progress Notes (Signed)
Patient's daughter and husband stated that if Ashley Conrad's heart were to stop they do not want CPR nor defibrillation performed on her.

## 2017-10-17 NOTE — Procedures (Signed)
Bedside Bronchoscopy Procedure Note ANNAMARIA SALAH [id number] 1945-04-11  Procedure: Bronchoscopy Indications: Diagnostic evaluation of the airways and Remove secretions  Procedure Details: ET Tube Size:7.5 ET Tube secured at lip (cm):24 Bite block in place: No In preparation for procedure, Patient hyper-oxygenated with 100 % FiO2 and Saline given via ETT (60 ml) Airway entered and the following bronchi were examined: RUL, RML, RLL, LUL, LLL and Bronchi.   Bronchoscope removed.  , Patient placed back on 100% FiO2 at conclusion of procedure.    Evaluation BP 106/75   Pulse 89   Temp 98.8 F (37.1 C) (Oral)   Resp (!) 30   Ht 5' (1.524 m)   Wt 81.6 kg   SpO2 (!) 88%   BMI 35.13 kg/m  Breath Sounds:Diminished O2 sats: stable throughout Patient's Current Condition: stable Specimens:  Sent  Complications: No apparent complications Patient did tolerate procedure well.   Ciro Backer 10/17/2017, 8:38 AM

## 2017-10-18 ENCOUNTER — Ambulatory Visit: Payer: Medicare Other | Attending: General Surgery | Admitting: Physical Therapy

## 2017-10-18 ENCOUNTER — Inpatient Hospital Stay (HOSPITAL_COMMUNITY): Payer: Medicare Other

## 2017-10-18 DIAGNOSIS — N179 Acute kidney failure, unspecified: Secondary | ICD-10-CM

## 2017-10-18 DIAGNOSIS — J80 Acute respiratory distress syndrome: Secondary | ICD-10-CM

## 2017-10-18 LAB — CBC WITH DIFFERENTIAL/PLATELET
ABS IMMATURE GRANULOCYTES: 0.1 10*3/uL (ref 0.0–0.1)
Basophils Absolute: 0 10*3/uL (ref 0.0–0.1)
Basophils Relative: 0 %
Eosinophils Absolute: 0 10*3/uL (ref 0.0–0.7)
Eosinophils Relative: 0 %
HEMATOCRIT: 31.7 % — AB (ref 36.0–46.0)
HEMOGLOBIN: 9.3 g/dL — AB (ref 12.0–15.0)
IMMATURE GRANULOCYTES: 1 %
LYMPHS ABS: 0.4 10*3/uL — AB (ref 0.7–4.0)
LYMPHS PCT: 3 %
MCH: 31.3 pg (ref 26.0–34.0)
MCHC: 29.3 g/dL — ABNORMAL LOW (ref 30.0–36.0)
MCV: 106.7 fL — ABNORMAL HIGH (ref 78.0–100.0)
Monocytes Absolute: 0.9 10*3/uL (ref 0.1–1.0)
Monocytes Relative: 7 %
NEUTROS ABS: 11.9 10*3/uL — AB (ref 1.7–7.7)
NEUTROS PCT: 89 %
Platelets: 192 10*3/uL (ref 150–400)
RBC: 2.97 MIL/uL — ABNORMAL LOW (ref 3.87–5.11)
RDW: 13.8 % (ref 11.5–15.5)
WBC: 13.2 10*3/uL — AB (ref 4.0–10.5)

## 2017-10-18 LAB — GLUCOSE, CAPILLARY
GLUCOSE-CAPILLARY: 172 mg/dL — AB (ref 70–99)
GLUCOSE-CAPILLARY: 230 mg/dL — AB (ref 70–99)
GLUCOSE-CAPILLARY: 168 mg/dL — AB (ref 70–99)
Glucose-Capillary: 196 mg/dL — ABNORMAL HIGH (ref 70–99)
Glucose-Capillary: 114 mg/dL — ABNORMAL HIGH (ref 70–99)
Glucose-Capillary: 253 mg/dL — ABNORMAL HIGH (ref 70–99)

## 2017-10-18 LAB — CULTURE, BLOOD (ROUTINE X 2)
CULTURE: NO GROWTH
Culture: NO GROWTH
Special Requests: ADEQUATE

## 2017-10-18 LAB — BLOOD GAS, ARTERIAL
Acid-Base Excess: 9.3 mmol/L — ABNORMAL HIGH (ref 0.0–2.0)
Bicarbonate: 34.2 mmol/L — ABNORMAL HIGH (ref 20.0–28.0)
DRAWN BY: 511911
FIO2: 90
O2 SAT: 95 %
PATIENT TEMPERATURE: 98.6
PCO2 ART: 54.6 mmHg — AB (ref 32.0–48.0)
PEEP: 18 cmH2O
PO2 ART: 80.5 mmHg — AB (ref 83.0–108.0)
RATE: 30 resp/min
VT: 430 mL
pH, Arterial: 7.414 (ref 7.350–7.450)

## 2017-10-18 LAB — BASIC METABOLIC PANEL
Anion gap: 9 (ref 5–15)
BUN: 93 mg/dL — AB (ref 8–23)
CO2: 33 mmol/L — ABNORMAL HIGH (ref 22–32)
Calcium: 8.7 mg/dL — ABNORMAL LOW (ref 8.9–10.3)
Chloride: 103 mmol/L (ref 98–111)
Creatinine, Ser: 1.33 mg/dL — ABNORMAL HIGH (ref 0.44–1.00)
GFR calc Af Amer: 44 mL/min — ABNORMAL LOW (ref >60)
GFR, EST NON AFRICAN AMERICAN: 38 mL/min — AB (ref >60)
Glucose, Bld: 191 mg/dL — ABNORMAL HIGH (ref 70–99)
POTASSIUM: 3.4 mmol/L — AB (ref 3.5–5.1)
SODIUM: 145 mmol/L (ref 135–145)

## 2017-10-18 LAB — PHOSPHORUS: PHOSPHORUS: 3.7 mg/dL (ref 2.5–4.6)

## 2017-10-18 LAB — PH, BODY FLUID: pH, Body Fluid: 8.1

## 2017-10-18 LAB — MAGNESIUM: Magnesium: 3 mg/dL — ABNORMAL HIGH (ref 1.7–2.4)

## 2017-10-18 MED ORDER — INSULIN ASPART 100 UNIT/ML ~~LOC~~ SOLN
0.0000 [IU] | SUBCUTANEOUS | Status: DC
  Administered 2017-10-18: 11 [IU] via SUBCUTANEOUS
  Administered 2017-10-19: 4 [IU] via SUBCUTANEOUS
  Administered 2017-10-19 (×2): 7 [IU] via SUBCUTANEOUS
  Administered 2017-10-19: 4 [IU] via SUBCUTANEOUS
  Administered 2017-10-19: 11 [IU] via SUBCUTANEOUS
  Administered 2017-10-19 – 2017-10-20 (×5): 7 [IU] via SUBCUTANEOUS
  Administered 2017-10-20: 11 [IU] via SUBCUTANEOUS
  Administered 2017-10-20 – 2017-10-21 (×2): 7 [IU] via SUBCUTANEOUS
  Administered 2017-10-21 (×2): 4 [IU] via SUBCUTANEOUS
  Administered 2017-10-21: 11 [IU] via SUBCUTANEOUS
  Administered 2017-10-21: 7 [IU] via SUBCUTANEOUS
  Administered 2017-10-21 – 2017-10-22 (×2): 4 [IU] via SUBCUTANEOUS
  Administered 2017-10-22: 11 [IU] via SUBCUTANEOUS
  Administered 2017-10-22: 4 [IU] via SUBCUTANEOUS

## 2017-10-18 MED ORDER — METHYLPREDNISOLONE SODIUM SUCC 125 MG IJ SOLR
40.0000 mg | Freq: Two times a day (BID) | INTRAMUSCULAR | Status: AC
  Administered 2017-10-18 – 2017-10-19 (×3): 40 mg via INTRAVENOUS
  Filled 2017-10-18 (×3): qty 2

## 2017-10-18 MED ORDER — ALBUMIN HUMAN 25 % IV SOLN
50.0000 g | Freq: Once | INTRAVENOUS | Status: AC
  Administered 2017-10-18: 50 g via INTRAVENOUS
  Filled 2017-10-18: qty 50

## 2017-10-18 MED ORDER — METOLAZONE 5 MG PO TABS
5.0000 mg | ORAL_TABLET | Freq: Once | ORAL | Status: AC
  Administered 2017-10-18: 5 mg via ORAL
  Filled 2017-10-18: qty 1

## 2017-10-18 MED ORDER — ALBUMIN HUMAN 5 % IV SOLN
INTRAVENOUS | Status: AC
  Filled 2017-10-18: qty 250

## 2017-10-18 MED ORDER — POTASSIUM CHLORIDE 20 MEQ/15ML (10%) PO SOLN
20.0000 meq | Freq: Three times a day (TID) | ORAL | Status: DC
  Administered 2017-10-18: 20 meq via ORAL
  Filled 2017-10-18: qty 15

## 2017-10-18 MED ORDER — FLEET ENEMA 7-19 GM/118ML RE ENEM
1.0000 | ENEMA | Freq: Once | RECTAL | Status: AC
  Administered 2017-10-18: 1 via RECTAL
  Filled 2017-10-18: qty 1

## 2017-10-18 MED ORDER — POTASSIUM CHLORIDE 20 MEQ/15ML (10%) PO SOLN
40.0000 meq | Freq: Three times a day (TID) | ORAL | Status: DC
  Administered 2017-10-18 – 2017-10-20 (×6): 40 meq via ORAL
  Filled 2017-10-18 (×6): qty 30

## 2017-10-18 MED ORDER — FUROSEMIDE 10 MG/ML IJ SOLN
40.0000 mg | Freq: Four times a day (QID) | INTRAMUSCULAR | Status: DC
  Administered 2017-10-18: 40 mg via INTRAVENOUS
  Filled 2017-10-18: qty 4

## 2017-10-18 MED ORDER — FUROSEMIDE 10 MG/ML IJ SOLN
10.0000 mg/h | INTRAVENOUS | Status: DC
  Administered 2017-10-18: 10 mg/h via INTRAVENOUS
  Filled 2017-10-18: qty 20

## 2017-10-18 MED ORDER — METHYLPREDNISOLONE SODIUM SUCC 40 MG IJ SOLR
20.0000 mg | Freq: Two times a day (BID) | INTRAMUSCULAR | Status: AC
  Administered 2017-10-20 – 2017-10-21 (×4): 20 mg via INTRAVENOUS
  Filled 2017-10-18 (×5): qty 0.5

## 2017-10-18 NOTE — Care Management Note (Signed)
Case Management Note  Patient Details  Name: LETTICIA BHATTACHARYYA MRN: [id number] Date of Birth: May 20, 1945  Subjective/Objective:  Pt transferred from [facility].  Pt admitted with resp failure and loculated pleural effusion - pt discharged in July s/p mastectomy for breast CA                  Action/Plan:  Pt currently on vent and continuous sedation.  Pt is from home with spouse.  Pt is on 5 liters continuous oxygen at home.   Expected Discharge Date:                  Expected Discharge Plan:     In-House Referral:  Clinical Social Work  Discharge planning Services  CM Consult  Post Acute Care Choice:    Choice offered to:     DME Arranged:    DME Agency:     HH Arranged:    HH Agency:     Status of Service:     If discussed at [employer], dates discussed:    Additional Comments:  Maryclare Labrador, RN 10/18/2017, 12:36 PM

## 2017-10-18 NOTE — Progress Notes (Signed)
HISTORY & PHYSICAL  Patient Name: Ashley Conrad MRN: [id number] DOB: 07-31-1945    ADMISSION DATE:  10/25/2017 DATE OF SERVICE:  10/16/2017  CHIEF COMPLAINT: Acute hypoxemic and hypercapnic respiratory; loculated right pleural effusion  BRIEF  Ashley Conrad is 72 y.o. African-American female reformed smoker transferred to the [facility] Emergency Department via EMS from [facility] [place], [place]) with complaints of acute hypoxemic and hypercapnic respiratory failure and a loculated right pleural effusion.  I was contacted by Dr. Ardis Rowan at [facility] with a request to transfer the patient after she presented to [facility] emergency department with complaints of increasing dyspnea and increasing oxygen requirement.  She is a known COPD patient who uses to 5 LPM via nasal cannula at baseline.  Initially, the request was for higher level care in hopes of definitive treatment of a loculated right pleural effusion; however, during her ER stay at [facility], the patient clinically deteriorated and required endotracheal intubation and mechanical ventilatory support.  The patient was recently hospitalized (09/18/2017) at [facility], during which she underwent lumpectomy for ductal invasive carcinoma.  Apparently, the patient reported upon presentation to [facility] emergency department that she did not feel she had done well after her hospital discharge.  She initially presented with respiratory distress and oxygen saturation in the 70s on her 5 LPM via nasal cannula.  She also initially exhibited tachypnea and hypertension; however, as she deteriorated and required BiPAP support she became mildly hypotensive.  Trial of BiPAP therapy was initiated with IPAP 16, EPAP 10, FiO2 80%.  Follow-up ABGs demonstrated failure to improve her hypercapnic respiratory failure.  Conversation with Dr. Rozell Searing suggests that the patient was getting  intravenous Solu-Medrol along with aerosol therapies.  Further ER evaluation included chest x-ray, which raise concern for right-sided pleural effusion.  She had a CT angiogram of the chest which was negative for thromboembolic disease; however, the images were reported to be concerning for loculated pleural effusion.  The patient did transport with a CD, presumably containing CT images, which are being sent to radiology to be uploaded.  EVENTS  8/16: Presents to [facility] emergency department with complaints of increased dyspnea and increasing oxygen requirement.  Failed BiPAP support.  Intubated.  ER evaluation revealed a loculated right pleural effusion. 8/17: Transferred to [facility] ICU afte rintubation  10/09/2017 -  - on vent. STill hypercapnic. On fent gtt 148mcg -> RASS -2 overnigt. Not on pressors. AFebrile. Making urine. Arrived in ICU few hours ago. No labs yet. On 100% fiow2/peep 5 -> pulse ox 95%  10/15/17 - IR bedside thoracentesis attempt with no fluid produced. Will return for IR guided thoracentesis 8/19.   8/20 - off diprivan. Increaed fio2 90, peep 12, sats ~94%. CXR still with bilateral effusion/consolidation/atelectasis, worse on the right. No fever. Makes urine. On Tube feeds..   SUBJECTIVE/OVERNIGHT/INTERVAL HX Patient stable overnight. Mildly hypertensive today. Increased effusion on cxr with diuresis held yesterday. Family discussion yesterday, and family decided to hold CPR and defibrillation if patient's heart stops.   VITAL SIGNS: Blood pressure (!) 135/51, pulse 86, temperature 100 F (37.8 C), temperature source Oral, resp. rate (!) 30, height 5' (1.524 m), weight 82.3 kg, SpO2 94 %. Vent Vent Mode: PRVC FiO2 (%):  [90 %-100 %] 90 % Set Rate:  [30 bmp-60 bmp] 30 bmp Vt Set:  [430 mL] 430 mL PEEP:  [12 cmH20-18 cmH20] 18 cmH20 Plateau Pressure:  [  [place] / OUTPUT:  Intake/Output Summary (Last 24 hours)  at 10/18/2017 4098 Last data filed at 10/18/2017 0700 Gross per 24 hour  Intake 3095.59 ml  Output 2800 ml  Net 295.59 ml    PHYSICAL EXAMINATION:  Constitution: NAD, incompletely sedated & moving to stimuli, appears stated age HENT: ET, OG tube in place, normocephalic, atraumatic Cardio: regular rate & rhythm, no m/r/g Respiratory: decreased breat sound on right, no wheezing, rales or rhonchi Abdominal: +bs, soft, non-distended MSK: no pitting edema Neuro: moderately sedated and moving to stimuli Skin: clean, dry, intact  LABS: Review of labs from [place] (03/17/1476 2119): Sodium 141, potassium 4.4, chloride 98, bicarbonate greater than 40, BUN 16, creatinine 0.6.  Glucose 131.  Calcium 8.7.  Protein 7.4.  Albumin 4.1.  Total bilirubin 0.4.  AST 29.  ALT 25.  Alkaline phosphatase 111.  CARDIAC ENZYMES (10/12/2017 2119): Troponin I less than 0.03.  Broke proBNP 120.  INR 1.1.  PTT 30.8.  D-dimer 510.  CBC (10/12/2017 2119): WBC 11.8 (73% neutrophils, 14% lymphocytes, 8% monocytes, 4% eosinophils, 1% basophils).  Hemoglobin 11.2, hematocrit 35.8.  MCV 97.5.  Platelets 264.  ABG (10/20/2017 0010): pH 7.18, PCO2 101, PO2 74 on BiPAP 16/10, FiO2 80% ABG (10/16/2017 0151): pH 7.10, PCO2 116, PO2 62 on AC 18/425/60/5  PULMONARY Recent Labs  Lab 10/15/17 0944  10/16/17 2257 10/17/17 0108 10/17/17 0405 10/17/17 1312 10/17/17 2030 10/18/17 0400  PHART 7.354   < > 7.240* 7.377 7.387 7.426 7.404 7.414  PCO2ART 61.7*   < > 85.2* 60.5* 58.4* 51.9* 55.2* 54.6*  PO2ART 72.0*   < > 69.0* 85.0 86.0 80.0* 72.0* 80.5*  HCO3 34.5*   < > 36.5* 35.6* 35.1* 34.2* 33.8* 34.2*  TCO2 36*  --  39* 37* 37* 36*  --   --   O2SAT 93.0   < > 89.0 96.0 96.0 96.0 93.2 95.0   < > = values in this interval not displayed.   CBC Recent Labs  Lab 10/16/17 0346 10/17/17 0352 10/18/17 0328  HGB 9.5* 9.6* 9.3*  HCT 33.2* 33.9* 31.7*  WBC 13.0* 17.3* 13.2*  PLT 220  220 192   COAGULATION Recent Labs  Lab 10/17/17 0018  INR 1.18   CARDIAC   Recent Labs  Lab 10/18/2017 0658 10/11/2017 1346 10/22/2017 2026  TROPONINI <0.03 <0.03 <0.03   No results for input(s): PROBNP in the last 168 hours.  CHEMISTRY Recent Labs  Lab 10/14/17 0334 10/14/17 2343 10/15/17 0339 10/15/17 0940 10/16/17 0346 10/17/17 0352 10/18/17 0328  NA  --  135  --  142 144 144 145  K  --  3.5  --  3.1* 4.3 3.5 3.4*  CL  --  97*  --  99 101 102 103  CO2  --  31  --  35* 34* 34* 33*  GLUCOSE  --  221*  --  236* 171* 198* 191*  BUN  --  42*  --  52* 63* 90* 93*  CREATININE  --  1.35*  --  1.52* 1.34* 1.62* 1.33*  CALCIUM  --  7.4*  --  7.9* 8.5* 8.3* 8.7*  MG 2.2  --  2.9*  --  2.8* 3.0* 3.0*  PHOS 2.9  --  5.6*  --  3.0 4.7* 3.7   Estimated Creatinine Clearance: 35.4 mL/min (A) (by C-G formula based on SCr of 1.33 mg/dL (H)).  LIVER Recent Labs  Lab 09/30/2017  2025 10/14/17 1443 10/15/17 0339 10/17/17 0018  AST 21  --   --   --   ALT 17  --   --   --   ALKPHOS 85  --   --   --   BILITOT 0.8  --   --   --   PROT 6.8  --  5.2*  --   ALBUMIN 3.3* 3.2*  --   --   INR  --   --   --  1.18   INFECTIOUS Recent Labs  Lab 10/15/2017 0658 10/14/17 0334 10/15/17 0339  LATICACIDVEN 1.2  --   --   PROCALCITON <0.10 <0.10 0.11   ENDOCRINE CBG (last 3)  Recent Labs    10/17/17 1941 10/17/17 2313 10/18/17 0324  GLUCAP 190* 238* 172*   IMAGING x48h  - image(s) personally visualized  -   highlighted in bold  Dg Chest Port 1 View  Result Date: 10/18/2017 CLINICAL DATA:  Check endotracheal tube placement EXAM: PORTABLE CHEST 1 VIEW COMPARISON:  10/17/2017 FINDINGS: Cardiac shadow is within normal limits. The endotracheal tube, left-sided PICC line and nasogastric catheter are in satisfactory position. Small left pleural effusion is noted new from the prior exam. Increasing left basilar atelectasis is noted. Persistent right basilar atelectasis is seen. IMPRESSION:  Increase in left basilar changes with effusion. Electronically Signed   By: Inez Catalina M.D.   On: 10/18/2017 06:53   Dg Chest Port 1 View  Result Date: 10/17/2017 CLINICAL DATA:  Status post bronchoscopy. EXAM: PORTABLE CHEST 1 VIEW COMPARISON:  Radiograph of same day. FINDINGS: Stable cardiomediastinal silhouette. Endotracheal and nasogastric tubes are unchanged in position. Left-sided PICC line is unchanged in position. No pneumothorax is noted. Stable bibasilar subsegmental atelectasis is noted. No significant pleural effusion is noted. Bony thorax is unremarkable. IMPRESSION: Stable support apparatus. Stable bibasilar subsegmental atelectasis. Electronically Signed   By: Marijo Conception, M.D.   On: 10/17/2017 09:04   Dg Chest Port 1 View  Result Date: 10/17/2017 CLINICAL DATA:  Post right thoracentesis last night. EXAM: PORTABLE CHEST 1 VIEW COMPARISON:  10/17/2016 FINDINGS: Endotracheal tube has tip 3.8 cm above the carina. Nasogastric tube courses into the stomach and off the inferior portion of the film as tip is not visualized. Left-sided PICC line has tip just below the cavoatrial junction. Lungs are adequately inflated demonstrate stable left base opacification likely small effusion with atelectasis. Slight worsening right base opacification likely atelectasis and small amount of pleural fluid. No evidence of pneumothorax. Cardiomediastinal silhouette and remainder of the exam is unchanged. IMPRESSION: Slight worsening opacification right base likely atelectasis with small amount of pleural fluid. Stable left base opacification likely small amount of pleural fluid and atelectasis. No pneumothorax. Tubes and lines as described. Electronically Signed   By: Marin Olp M.D.   On: 10/17/2017 08:34   Dg Chest Port 1 View  Result Date: 10/17/2017 CLINICAL DATA:  Right pleural effusion post thoracentesis EXAM: PORTABLE CHEST 1 VIEW COMPARISON:  10/16/2016, 10/15/2017, 10/14/2016 FINDINGS:  Endotracheal tube tip is about 2.2 cm superior to the carina. Esophageal tube tip is below the diaphragm. Small right pleural effusion, decreased. No pneumothorax on the right. Small left pleural effusion. Consolidation at the left lung base. Stable cardiomediastinal silhouette with aortic atherosclerosis. Left upper extremity catheter tip at the cavoatrial region. Hyperlucency at the left CP angle. IMPRESSION: 1. Support lines and tubes as above. 2. Small right pleural effusion, decreased compared to prior. No right pneumothorax. 3. Small left  pleural effusion. Hyperlucency at the left CP angle is questionable for pneumothorax. Attention on follow-up radiograph recommended. 4. Subsegmental atelectasis right base. Consolidation at the left lung base, atelectasis or pneumonia Electronically Signed   By: Donavan Foil M.D.   On: 10/17/2017 00:29   Dg Chest Port 1 View  Result Date: 10/16/2017 CLINICAL DATA:  72 y/o F; desaturation tonight. History of respiratory failure. Intubated. EXAM: PORTABLE CHEST 1 VIEW COMPARISON:  10/16/2017 chest radiograph. FINDINGS: Stable cardiac silhouette given projection and technique. Endotracheal and enteric tubes are stable. Stable right PICC line tip projecting over lower SVC. Stable small to moderate bilateral effusions. Stable bibasilar opacities, right greater than left. No pneumothorax. Bones are unremarkable. IMPRESSION: Stable right larger than left pleural effusions and associated bibasilar opacities which Conrad represent atelectasis or pneumonia. Stable lines and tubes. Electronically Signed   By: Kristine Garbe M.D.   On: 10/16/2017 22:57   Dg Chest Port 1 View  Result Date: 10/16/2017 CLINICAL DATA:  ET tube, OG tube EXAM: PORTABLE CHEST 1 VIEW COMPARISON:  10/16/2017 FINDINGS: Endotracheal tube is 2.6 cm above the carina. NG tube enters the stomach. Layering small to moderate bilateral effusions, right greater than left with bilateral lower lobe  atelectasis or infiltrates, also greater on the right. Heart is normal size. IMPRESSION: Endotracheal tube and NG tube in expected position as above. Small to moderate bilateral effusions with bilateral lower lobe atelectasis or pneumonia, right greater than left. Electronically Signed   By: Rolm Baptise M.D.   On: 10/16/2017 11:43   . sodium chloride 10 mL/hr at 10/18/17 0700  . sodium chloride    . ceFEPime (MAXIPIME) IV 2 g (10/18/17 0507)  . dexmedetomidine (PRECEDEX) IV infusion 1.2 mcg/kg/hr (10/18/17 0700)  . feeding supplement (VITAL HIGH PROTEIN) 1,000 mL (10/16/17 1347)  . fentaNYL infusion INTRAVENOUS 400 mcg/hr (10/18/17 0700)  . norepinephrine (LEVOPHED) Adult infusion Stopped (10/17/17 1712)  . propofol (DIPRIVAN) infusion Stopped (10/15/17 1041)    Echo 8/21: Study Conclusions  - Left ventricle: The cavity size was normal. There was mild focal   basal hypertrophy of the septum. Systolic function was normal.   The estimated ejection fraction was in the range of 55% to 60%.   Wall motion was normal; there were no regional wall motion   abnormalities. Doppler parameters are consistent with abnormal   left ventricular relaxation (grade 1 diastolic dysfunction). - Right ventricle: The cavity size was mildly dilated. Wall   thickness was normal. Systolic function was mildly reduced. - Tricuspid valve: There was trivial regurgitation. - Pulmonary arteries: Systolic pressure was mildly increased. PA   peak pressure: 35 mm Hg (S).   ASSESSMENT / PLAN: Principal Problem:   Acute on chronic respiratory failure with hypoxia and hypercapnia (HCC) Active Problems:   Malignant neoplasm of upper-outer quadrant of right breast in female, estrogen receptor positive (HCC)   Breast cancer (HCC)   COPD exacerbation (HCC)   Loculated pleural effusion   Hypertension   Agitation requiring sedation protocol   No contraindication to deep vein thrombosis (DVT) prophylaxis   At risk for  stress ulcer   At risk for hyperglycemia   History of sinus tachycardia   ARDS (adult respiratory distress syndrome) (HCC)   Pleural effusion   Septic shock (HCC)   Acute renal failure (HCC)  Acute on chronic respiratory failure with hypoxia and hypercapnia ([place]) Intubated 8/16. Patient partial code yesterady w/no CPR/cardioversion. S/p right thoracentesis 8/21. CXR showing worsening of pleural effusions b/l. ABG  improved. Echo with EF 17-79% & mild diastolic CHF. precedex 1.2, fent 400. Plan Restart gentle diureses  Replete K Decrease PEEP/FiO2 and titrate O2 for sats Continue deep sedation Culture, cytology, gram stain right bronch wash pending  COPD exacerbation (HCC) No wheezing currently due to steroid and nebs Plan: Continue solumedrol at current dose Pulmicort, brovana nebs BID, duonebs q4h RVP negative Restart lasix  Exudative loculated pleural effusion LDH ratio 6.1, possibly mets from [facility]. Bedside right-sided thoracentesis 8/21 with 1200cc bloody pleural fluid obtained. Negative pleural fluid gram stain. PCT < .1 Plan: Pleural culture and cytology pending  Pleural fluid pH pending - send out lab that likely will be unreliable due to sitting too long Am CBC, BMP, Mg, Phos Cefepime day 6/7 as below - vanc d/ced Maintain on cefepime for now given WBC, PCT is not effected with anything but leukemia and lymphoma so likely effective here.  Agitation requiring sedation protocol Plan: Propofol stopped Fentanyl & precedex will continue for today given vent needs  Malignant neoplasm of upper-outer quadrant of right breast in female, estrogen receptor positive ([place]) Continue arimidex opd med  AKI: Cr 1.3 from 1.6. ~baseline .7; BUN inc  Trend BMP Replete electrolytes  Hypermag - loops  hypoK - repleting  Hypertension: mildly elevated  diuresis  VTE: No contraindication to deep vein thrombosis (DVT) prophylaxis given cancer hx. DVT doppler b/l LE yesterday  negative Sq lovenox   At risk for stress ulcer h2 blockade  At risk for hyperglycemia icu hyperglycemia protocol  History of sinus tachycardia: held yesterday  Takes po dilt at home. resstarted here in hospital via tube   FAMILY  - Updates:--> Large family discussion yesterday concerning goals of care and prognosis. Family has decided they do not want CPR or cardioversion.   - Inter-disciplinary family meet or Palliative Care meeting due by:  Day 7. Current LOS is LOS 5 days  DISPO Keep in ICU  Marty Heck, DO 10/18/2017, 7:17 AM Pager: [phone]

## 2017-10-18 NOTE — Progress Notes (Signed)
HISTORY & PHYSICAL  Patient Name: Ashley Conrad MRN: [id number] DOB: 1945/08/03    ADMISSION DATE:  10/26/2017 DATE OF SERVICE:  10/16/2017  CHIEF COMPLAINT: Acute hypoxemic and hypercapnic respiratory; loculated right pleural effusion  BRIEF  [name] is 72 y.o. African-American female reformed smoker transferred to the [facility] Emergency Department via EMS from [facility] [place], [place]) with complaints of acute hypoxemic and hypercapnic respiratory failure and a loculated right pleural effusion.  I was contacted by Dr. Ardis Rowan at [facility] with a request to transfer the patient after she presented to [facility] emergency department with complaints of increasing dyspnea and increasing oxygen requirement.  She is a known COPD patient who uses to 5 LPM via nasal cannula at baseline.  Initially, the request was for higher level care in hopes of definitive treatment of a loculated right pleural effusion; however, during her ER stay at [facility], the patient clinically deteriorated and required endotracheal intubation and mechanical ventilatory support.  The patient was recently hospitalized (09/18/2017) at [facility], during which she underwent lumpectomy for ductal invasive carcinoma.  Apparently, the patient reported upon presentation to [facility] emergency department that she did not feel she had done well after her hospital discharge.  She initially presented with respiratory distress and oxygen saturation in the 70s on her 5 LPM via nasal cannula.  She also initially exhibited tachypnea and hypertension; however, as she deteriorated and required BiPAP support she became mildly hypotensive.  Trial of BiPAP therapy was initiated with IPAP 16, EPAP 10, FiO2 80%.  Follow-up ABGs demonstrated failure to improve her hypercapnic respiratory failure.  Conversation with Dr. Rozell Searing suggests that the patient was getting  intravenous Solu-Medrol along with aerosol therapies.  Further ER evaluation included chest x-ray, which raise concern for right-sided pleural effusion.  She had a CT angiogram of the chest which was negative for thromboembolic disease; however, the images were reported to be concerning for loculated pleural effusion.  The patient did transport with a CD, presumably containing CT images, which are being sent to radiology to be uploaded.  EVENTS  8/16: Presents to [facility] emergency department with complaints of increased dyspnea and increasing oxygen requirement.  Failed BiPAP support.  Intubated.  ER evaluation revealed a loculated right pleural effusion. 8/17: Transferred to [place] ICU afte rintubation  10/16/2017 -  - on vent. STill hypercapnic. On fent gtt 155mcg -> RASS -2 overnigt. Not on pressors. AFebrile. Making urine. Arrived in ICU few hours ago. No labs yet. On 100% fiow2/peep 5 -> pulse ox 95%  10/15/17 - IR bedside thoracentesis attempt with no fluid produced. Will return for IR guided thoracentesis 8/19.   8/20 - off diprivan. Increaed fio2 90, peep 12, sats ~94%. CXR still with bilateral effusion/consolidation/atelectasis, worse on the right. No fever. Makes urine. On Tube feeds..   SUBJECTIVE/OVERNIGHT/INTERVAL HX ETT replaced yesterday, desat overnight and thora was done with 1200 cc of bloody fluid removed, this AM desat and hypotension, bronch done with no mucous plugging and repeat CXR with no evidence of PTX noted.  VITAL SIGNS: Blood pressure (!) 135/51, pulse 86, temperature 100 F (37.8 C), temperature source Oral, resp. rate (!) 30, height 5' (1.524 m), weight 82.3 kg, SpO2 94 %. Vent Vent Mode: PRVC FiO2 (%):  [80 %-90 %] 80 % Set Rate:  [30 bmp] 30 bmp Vt Set:  [430 mL] 430 mL PEEP:  [18 cmH20] 18  cmH20 Plateau Pressure:  [29 cmH20-30 cmH20] 29 cmH20   INTAKE / OUTPUT:  Intake/Output Summary (Last 24 hours) at 10/18/2017  0954 Last data filed at 10/18/2017 0800 Gross per 24 hour  Intake 2182.87 ml  Output 2300 ml  Net -117.13 ml    PHYSICAL EXAMINATION:  Constitution: Acutely ill appearing female, NAD HEENT: [place]/AT, PERRL, EOM-I and MMM Cardio: RRR, Nl S1/S2 and -M/R/G. Respiratory: Coarse BS diffusely Abdominal: Soft, NT, ND and +BS GU: Foley in place Neuro: Sedated but withdraws to pain Skin: clean/dry/intact   LABS: Review of labs from [place] (08/24/3149 2119): Sodium 141, potassium 4.4, chloride 98, bicarbonate greater than 40, BUN 16, creatinine 0.6.  Glucose 131.  Calcium 8.7.  Protein 7.4.  Albumin 4.1.  Total bilirubin 0.4.  AST 29.  ALT 25.  Alkaline phosphatase 111.  CARDIAC ENZYMES (10/12/2017 2119): Troponin I less than 0.03.  Broke proBNP 120.  INR 1.1.  PTT 30.8.  D-dimer 510.  CBC (10/12/2017 2119): WBC 11.8 (73% neutrophils, 14% lymphocytes, 8% monocytes, 4% eosinophils, 1% basophils).  Hemoglobin 11.2, hematocrit 35.8.  MCV 97.5.  Platelets 264.  ABG (10/11/2017 0010): pH 7.18, PCO2 101, PO2 74 on BiPAP 16/10, FiO2 80% ABG (09/27/2017 0151): pH 7.10, PCO2 116, PO2 62 on AC 18/425/60/5  PULMONARY Recent Labs  Lab 10/15/17 0944  10/16/17 2257 10/17/17 0108 10/17/17 0405 10/17/17 1312 10/17/17 2030 10/18/17 0400  PHART 7.354   < > 7.240* 7.377 7.387 7.426 7.404 7.414  PCO2ART 61.7*   < > 85.2* 60.5* 58.4* 51.9* 55.2* 54.6*  PO2ART 72.0*   < > 69.0* 85.0 86.0 80.0* 72.0* 80.5*  HCO3 34.5*   < > 36.5* 35.6* 35.1* 34.2* 33.8* 34.2*  TCO2 36*  --  39* 37* 37* 36*  --   --   O2SAT 93.0   < > 89.0 96.0 96.0 96.0 93.2 95.0   < > = values in this interval not displayed.   CBC Recent Labs  Lab 10/16/17 0346 10/17/17 0352 10/18/17 0328  HGB 9.5* 9.6* 9.3*  HCT 33.2* 33.9* 31.7*  WBC 13.0* 17.3* 13.2*  PLT 220 220 192   COAGULATION Recent Labs  Lab 10/17/17 0018  INR 1.18   CARDIAC   Recent Labs  Lab 10/20/2017 0658 10/06/2017 1346  10/03/2017 2026  TROPONINI <0.03 <0.03 <0.03   No results for input(s): PROBNP in the last 168 hours.  CHEMISTRY Recent Labs  Lab 10/14/17 0334 10/14/17 2343 10/15/17 0339 10/15/17 0940 10/16/17 0346 10/17/17 0352 10/18/17 0328  NA  --  135  --  142 144 144 145  K  --  3.5  --  3.1* 4.3 3.5 3.4*  CL  --  97*  --  99 101 102 103  CO2  --  31  --  35* 34* 34* 33*  GLUCOSE  --  221*  --  236* 171* 198* 191*  BUN  --  42*  --  52* 63* 90* 93*  CREATININE  --  1.35*  --  1.52* 1.34* 1.62* 1.33*  CALCIUM  --  7.4*  --  7.9* 8.5* 8.3* 8.7*  MG 2.2  --  2.9*  --  2.8* 3.0* 3.0*  PHOS 2.9  --  5.6*  --  3.0 4.7* 3.7   Estimated Creatinine Clearance: 35.4 mL/min (A) (by C-G formula based on SCr of 1.33 mg/dL (H)).  LIVER Recent Labs  Lab 10/08/2017 0658 10/14/17 1443 10/15/17 0339 10/17/17 0018  AST 21  --   --   --  ALT 17  --   --   --   ALKPHOS 85  --   --   --   BILITOT 0.8  --   --   --   PROT 6.8  --  5.2*  --   ALBUMIN 3.3* 3.2*  --   --   INR  --   --   --  1.18   INFECTIOUS Recent Labs  Lab 10/20/2017 0658 10/14/17 0334 10/15/17 0339  LATICACIDVEN 1.2  --   --   PROCALCITON <0.10 <0.10 0.11   ENDOCRINE CBG (last 3)  Recent Labs    10/17/17 2313 10/18/17 0324 10/18/17 0717  GLUCAP 238* 172* 196*   IMAGING x48h  - image(s) personally visualized  -   highlighted in bold  Dg Chest Port 1 View  Result Date: 10/18/2017 CLINICAL DATA:  Check endotracheal tube placement EXAM: PORTABLE CHEST 1 VIEW COMPARISON:  10/17/2017 FINDINGS: Cardiac shadow is within normal limits. The endotracheal tube, left-sided PICC line and nasogastric catheter are in satisfactory position. Small left pleural effusion is noted new from the prior exam. Increasing left basilar atelectasis is noted. Persistent right basilar atelectasis is seen. IMPRESSION: Increase in left basilar changes with effusion. Electronically Signed   By: Inez Catalina M.D.   On: 10/18/2017 06:53   Dg Chest Port 1  View  Result Date: 10/17/2017 CLINICAL DATA:  Status post bronchoscopy. EXAM: PORTABLE CHEST 1 VIEW COMPARISON:  Radiograph of same day. FINDINGS: Stable cardiomediastinal silhouette. Endotracheal and nasogastric tubes are unchanged in position. Left-sided PICC line is unchanged in position. No pneumothorax is noted. Stable bibasilar subsegmental atelectasis is noted. No significant pleural effusion is noted. Bony thorax is unremarkable. IMPRESSION: Stable support apparatus. Stable bibasilar subsegmental atelectasis. Electronically Signed   By: Marijo Conception, M.D.   On: 10/17/2017 09:04   Dg Chest Port 1 View  Result Date: 10/17/2017 CLINICAL DATA:  Post right thoracentesis last night. EXAM: PORTABLE CHEST 1 VIEW COMPARISON:  10/17/2016 FINDINGS: Endotracheal tube has tip 3.8 cm above the carina. Nasogastric tube courses into the stomach and off the inferior portion of the film as tip is not visualized. Left-sided PICC line has tip just below the cavoatrial junction. Lungs are adequately inflated demonstrate stable left base opacification likely small effusion with atelectasis. Slight worsening right base opacification likely atelectasis and small amount of pleural fluid. No evidence of pneumothorax. Cardiomediastinal silhouette and remainder of the exam is unchanged. IMPRESSION: Slight worsening opacification right base likely atelectasis with small amount of pleural fluid. Stable left base opacification likely small amount of pleural fluid and atelectasis. No pneumothorax. Tubes and lines as described. Electronically Signed   By: Marin Olp M.D.   On: 10/17/2017 08:34   Dg Chest Port 1 View  Result Date: 10/17/2017 CLINICAL DATA:  Right pleural effusion post thoracentesis EXAM: PORTABLE CHEST 1 VIEW COMPARISON:  10/16/2016, 10/15/2017, 10/14/2016 FINDINGS: Endotracheal tube tip is about 2.2 cm superior to the carina. Esophageal tube tip is below the diaphragm. Small right pleural effusion,  decreased. No pneumothorax on the right. Small left pleural effusion. Consolidation at the left lung base. Stable cardiomediastinal silhouette with aortic atherosclerosis. Left upper extremity catheter tip at the cavoatrial region. Hyperlucency at the left CP angle. IMPRESSION: 1. Support lines and tubes as above. 2. Small right pleural effusion, decreased compared to prior. No right pneumothorax. 3. Small left pleural effusion. Hyperlucency at the left CP angle is questionable for pneumothorax. Attention on follow-up radiograph recommended. 4. Subsegmental atelectasis  right base. Consolidation at the left lung base, atelectasis or pneumonia Electronically Signed   By: Donavan Foil M.D.   On: 10/17/2017 00:29   Dg Chest Port 1 View  Result Date: 10/16/2017 CLINICAL DATA:  72 y/o F; desaturation tonight. History of respiratory failure. Intubated. EXAM: PORTABLE CHEST 1 VIEW COMPARISON:  10/16/2017 chest radiograph. FINDINGS: Stable cardiac silhouette given projection and technique. Endotracheal and enteric tubes are stable. Stable right PICC line tip projecting over lower SVC. Stable small to moderate bilateral effusions. Stable bibasilar opacities, right greater than left. No pneumothorax. Bones are unremarkable. IMPRESSION: Stable right larger than left pleural effusions and associated bibasilar opacities which Conrad represent atelectasis or pneumonia. Stable lines and tubes. Electronically Signed   By: Kristine Garbe M.D.   On: 10/16/2017 22:57   Dg Chest Port 1 View  Result Date: 10/16/2017 CLINICAL DATA:  ET tube, OG tube EXAM: PORTABLE CHEST 1 VIEW COMPARISON:  10/16/2017 FINDINGS: Endotracheal tube is 2.6 cm above the carina. NG tube enters the stomach. Layering small to moderate bilateral effusions, right greater than left with bilateral lower lobe atelectasis or infiltrates, also greater on the right. Heart is normal size. IMPRESSION: Endotracheal tube and NG tube in expected position as  above. Small to moderate bilateral effusions with bilateral lower lobe atelectasis or pneumonia, right greater than left. Electronically Signed   By: Rolm Baptise M.D.   On: 10/16/2017 11:43   . sodium chloride 10 mL/hr at 10/18/17 0800  . sodium chloride    . ceFEPime (MAXIPIME) IV 2 g (10/18/17 0507)  . dexmedetomidine (PRECEDEX) IV infusion 1.2 mcg/kg/hr (10/18/17 0800)  . feeding supplement (VITAL HIGH PROTEIN) 1,000 mL (10/16/17 1347)  . fentaNYL infusion INTRAVENOUS 400 mcg/hr (10/18/17 0800)  . norepinephrine (LEVOPHED) Adult infusion Stopped (10/17/17 1712)  . propofol (DIPRIVAN) infusion Stopped (10/15/17 1041)   ASSESSMENT / PLAN: Principal Problem:   Acute on chronic respiratory failure with hypoxia and hypercapnia (HCC) Active Problems:   Malignant neoplasm of upper-outer quadrant of right breast in female, estrogen receptor positive (HCC)   Breast cancer (HCC)   COPD exacerbation (HCC)   Loculated pleural effusion   Hypertension   Agitation requiring sedation protocol   No contraindication to deep vein thrombosis (DVT) prophylaxis   At risk for stress ulcer   At risk for hyperglycemia   History of sinus tachycardia   ARDS (adult respiratory distress syndrome) (HCC)   Pleural effusion   Septic shock (HCC)   Acute renal failure (HCC)  Acute on chronic respiratory failure with hypoxia and hypercapnia ([place]) Intubated 8/16. S/p right thoracentesis w/ acute hypercapnea o/n, improved with increased RR 30, FiO2 100%. CXR showing improvement of right pleural effusion.  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 Collected micro resp.  precedex 1 mcg/kg/hr, fentanyl 437mcg/hr; Now FiO2 90%, RR 30, PEEP 12, I:E 1:1.7 Plan Decrease PEEP to 16 Titrate sedation down as able F/U on cultures No PTX on CXR, no need for CT Culture, cytology, gram stain right bronch wash Active diureses today Patient would not want longterm ventilatory support, will  need to discuss trach options with family  COPD exacerbation ([place]) No wheezing currently due to steroid and nebs Plan: Decrease solumedrol to 40 q12 x2 days then 20 q12 x2 days then D/C Pulmicort, brovana nebs BID, duonebs q4h RVP negative Lasix 10 mg/hr Zaroxolyn 5 mg PO x1  Loculated pleural effusion Bedside right-sided thoracentesis o/n with 1200cc bloody pleural fluid obtained. Negative pleural fluid gram  stain.  PCT < 0.1.  Plan: Pleural fluid pH pending - send out lab that likely will be unreliable due to sitting too long Pleural culture and cytology pending  Am CBC, BMP, Mg, Phos Cefepime day 5/7 as below - vanc d/ced stop date in place Maintain on cefepime for now given WBC, PCT is not effected with anything but leukemia and lymphoma so likely effective here. F/u cxr s/p thoracentesis   Agitation requiring sedation protocol Plan: Propofol stopped Fentanyl and precedex drips as ordered  Malignant neoplasm of upper-outer quadrant of right breast in female, estrogen receptor positive ([place]) Continue arimidex opd med  AKI: Cr 1.6 from 1.3. ~baseline .7; BUN inc  Lasix drip Zaroxolyn as above  Hypertension Monitor  No contraindication to deep vein thrombosis (DVT) prophylaxis Given cancer hx  Sq lovenox   At risk for stress ulcer h2 blockade  At risk for hyperglycemia ICU hyperglycemia protocol  History of sinus tachycardia Takes po dilt at home. resstarted here in hospital via tube   FAMILY  - Updates:--> husband updated, will need to get family together for discussion regarding plan of care, today is day 6 of intubation.  - Inter-disciplinary family meet or Palliative Care meeting due by:  Day 7. Current LOS is LOS 5 days  DISPO Keep in ICU  The patient is critically ill with multiple organ systems failure and requires high complexity decision making for assessment and support, frequent evaluation and titration of therapies, application of advanced  monitoring technologies and extensive interpretation of multiple databases.   Critical Care Time devoted to patient care services described in this note is  35  Minutes. This time reflects time of care of this signee Dr [name]. This critical care time does not reflect procedure time, or teaching time or supervisory time of PA/NP/Med student/Med Resident etc but could involve care discussion time.  Rush Farmer, M.D. [facility] Pulmonary/Critical Care Medicine. Pager: [phone]. After hours pager: [phone].

## 2017-10-18 NOTE — Progress Notes (Signed)
[place] Progress Note Patient Name: Ashley Conrad DOB: 05-25-1945 MRN: [id number]   Date of Service  10/18/2017  HPI/Events of Note  Hyperglycemia - blood glucose = 253.  eICU Interventions  Will change to Q 4 hour resistant Novolog SSI.     Intervention Category Major Interventions: Hyperglycemia - active titration of insulin therapy  Lysle Dingwall 10/18/2017, 11:37 PM

## 2017-10-19 ENCOUNTER — Inpatient Hospital Stay (HOSPITAL_COMMUNITY): Payer: Medicare Other

## 2017-10-19 DIAGNOSIS — J939 Pneumothorax, unspecified: Secondary | ICD-10-CM

## 2017-10-19 LAB — BASIC METABOLIC PANEL
Anion gap: 12 (ref 5–15)
Anion gap: 12 (ref 5–15)
BUN: 96 mg/dL — AB (ref 8–23)
BUN: 104 mg/dL — AB (ref 8–23)
CO2: 35 mmol/L — ABNORMAL HIGH (ref 22–32)
CO2: 36 mmol/L — ABNORMAL HIGH (ref 22–32)
CREATININE: 1.58 mg/dL — AB (ref 0.44–1.00)
Calcium: 9.3 mg/dL (ref 8.9–10.3)
Calcium: 9.3 mg/dL (ref 8.9–10.3)
Chloride: 100 mmol/L (ref 98–111)
Chloride: 100 mmol/L (ref 98–111)
Creatinine, Ser: 1.71 mg/dL — ABNORMAL HIGH (ref 0.44–1.00)
GFR calc Af Amer: 36 mL/min — ABNORMAL LOW (ref >60)
GFR calc Af Amer: 33 mL/min — ABNORMAL LOW (ref >60)
GFR, EST NON AFRICAN AMERICAN: 31 mL/min — AB (ref >60)
GFR, EST NON AFRICAN AMERICAN: 28 mL/min — AB (ref >60)
GLUCOSE: 242 mg/dL — AB (ref 70–99)
Glucose, Bld: 252 mg/dL — ABNORMAL HIGH (ref 70–99)
POTASSIUM: 3.5 mmol/L (ref 3.5–5.1)
Potassium: 3.7 mmol/L (ref 3.5–5.1)
SODIUM: 148 mmol/L — AB (ref 135–145)
Sodium: 147 mmol/L — ABNORMAL HIGH (ref 135–145)

## 2017-10-19 LAB — CBC WITH DIFFERENTIAL/PLATELET
ABS IMMATURE GRANULOCYTES: 0.1 10*3/uL (ref 0.0–0.1)
BASOS ABS: 0 10*3/uL (ref 0.0–0.1)
Basophils Relative: 0 %
Eosinophils Absolute: 0.1 10*3/uL (ref 0.0–0.7)
Eosinophils Relative: 0 %
HCT: 33.3 % — ABNORMAL LOW (ref 36.0–46.0)
HEMOGLOBIN: 9.7 g/dL — AB (ref 12.0–15.0)
Immature Granulocytes: 1 %
LYMPHS PCT: 2 %
Lymphs Abs: 0.4 10*3/uL — ABNORMAL LOW (ref 0.7–4.0)
MCH: 31 pg (ref 26.0–34.0)
MCHC: 29.1 g/dL — AB (ref 30.0–36.0)
MCV: 106.4 fL — ABNORMAL HIGH (ref 78.0–100.0)
Monocytes Absolute: 1.4 10*3/uL — ABNORMAL HIGH (ref 0.1–1.0)
Monocytes Relative: 6 %
NEUTROS ABS: 21.3 10*3/uL — AB (ref 1.7–7.7)
Neutrophils Relative %: 91 %
Platelets: 218 10*3/uL (ref 150–400)
RBC: 3.13 MIL/uL — AB (ref 3.87–5.11)
RDW: 14.5 % (ref 11.5–15.5)
WBC: 23.3 10*3/uL — ABNORMAL HIGH (ref 4.0–10.5)

## 2017-10-19 LAB — GLUCOSE, CAPILLARY
GLUCOSE-CAPILLARY: 226 mg/dL — AB (ref 70–99)
GLUCOSE-CAPILLARY: 198 mg/dL — AB (ref 70–99)
GLUCOSE-CAPILLARY: 268 mg/dL — AB (ref 70–99)
Glucose-Capillary: 234 mg/dL — ABNORMAL HIGH (ref 70–99)
Glucose-Capillary: 193 mg/dL — ABNORMAL HIGH (ref 70–99)
Glucose-Capillary: 223 mg/dL — ABNORMAL HIGH (ref 70–99)

## 2017-10-19 LAB — CBC
HEMATOCRIT: 32.9 % — AB (ref 36.0–46.0)
HEMOGLOBIN: 9.6 g/dL — AB (ref 12.0–15.0)
MCH: 31.3 pg (ref 26.0–34.0)
MCHC: 29.2 g/dL — ABNORMAL LOW (ref 30.0–36.0)
MCV: 107.2 fL — AB (ref 78.0–100.0)
Platelets: 207 10*3/uL (ref 150–400)
RBC: 3.07 MIL/uL — ABNORMAL LOW (ref 3.87–5.11)
RDW: 14.5 % (ref 11.5–15.5)
WBC: 22.3 10*3/uL — AB (ref 4.0–10.5)

## 2017-10-19 LAB — POCT I-STAT 3, ART BLOOD GAS (G3+)
ACID-BASE EXCESS: 11 mmol/L — AB (ref 0.0–2.0)
BICARBONATE: 36.1 mmol/L — AB (ref 20.0–28.0)
O2 Saturation: 92 %
TCO2: 38 mmol/L — AB (ref 22–32)
pCO2 arterial: 52.4 mmHg — ABNORMAL HIGH (ref 32.0–48.0)
pH, Arterial: 7.448 (ref 7.350–7.450)
pO2, Arterial: 65 mmHg — ABNORMAL LOW (ref 83.0–108.0)

## 2017-10-19 LAB — MAGNESIUM: MAGNESIUM: 2.5 mg/dL — AB (ref 1.7–2.4)

## 2017-10-19 LAB — PHOSPHORUS: Phosphorus: 4.2 mg/dL (ref 2.5–4.6)

## 2017-10-19 LAB — CULTURE, RESPIRATORY W GRAM STAIN

## 2017-10-19 LAB — CULTURE, RESPIRATORY

## 2017-10-19 MED ORDER — ETOMIDATE 2 MG/ML IV SOLN: 10.0000 mg | Freq: Once | INTRAVENOUS | Status: DC

## 2017-10-19 MED ORDER — FENTANYL CITRATE (PF) 100 MCG/2ML IJ SOLN
100.0000 ug | Freq: Once | INTRAMUSCULAR | Status: AC
  Administered 2017-10-19: 100 ug via INTRAVENOUS

## 2017-10-19 MED ORDER — METOLAZONE 5 MG PO TABS
5.0000 mg | ORAL_TABLET | Freq: Once | ORAL | Status: DC
  Filled 2017-10-19: qty 1

## 2017-10-19 MED ORDER — FENTANYL CITRATE (PF) 100 MCG/2ML IJ SOLN
INTRAMUSCULAR | Status: AC
  Administered 2017-10-19: 100 ug
  Filled 2017-10-19: qty 2

## 2017-10-19 MED ORDER — ETOMIDATE 2 MG/ML IV SOLN
20.0000 mg | Freq: Once | INTRAVENOUS | Status: AC
  Administered 2017-10-19: 20 mg via INTRAVENOUS

## 2017-10-19 MED ORDER — FUROSEMIDE 10 MG/ML IJ SOLN
10.0000 mg/h | INTRAVENOUS | Status: DC
  Administered 2017-10-19: 10 mg/h via INTRAVENOUS
  Filled 2017-10-19: qty 25

## 2017-10-19 NOTE — Progress Notes (Signed)
HISTORY & PHYSICAL  Patient Name: Ashley Conrad MRN: [id number] DOB: 03/04/1945    ADMISSION DATE:  10/01/2017 DATE OF SERVICE:  10/16/2017  CHIEF COMPLAINT: Acute hypoxemic and hypercapnic respiratory; loculated right pleural effusion  BRIEF  Ashley Conrad is 72 y.o. African-American female reformed smoker transferred to the [facility] Emergency Department via EMS from [facility] [place], [place]) with complaints of acute hypoxemic and hypercapnic respiratory failure and a loculated right pleural effusion.  I was contacted by Dr. Ardis Rowan at [facility] with a request to transfer the patient after she presented to [facility] emergency department with complaints of increasing dyspnea and increasing oxygen requirement.  She is a known COPD patient who uses to 5 LPM via nasal cannula at baseline.  Initially, the request was for higher level care in hopes of definitive treatment of a loculated right pleural effusion; however, during her ER stay at [facility], the patient clinically deteriorated and required endotracheal intubation and mechanical ventilatory support.  The patient was recently hospitalized (09/18/2017) at [facility], during which she underwent lumpectomy for ductal invasive carcinoma.  Apparently, the patient reported upon presentation to [facility] emergency department that she did not feel she had done well after her hospital discharge.  She initially presented with respiratory distress and oxygen saturation in the 70s on her 5 LPM via nasal cannula.  She also initially exhibited tachypnea and hypertension; however, as she deteriorated and required BiPAP support she became mildly hypotensive.  Trial of BiPAP therapy was initiated with IPAP 16, EPAP 10, FiO2 80%.  Follow-up ABGs demonstrated failure to improve her hypercapnic respiratory failure.  Conversation with Dr. Rozell Searing suggests that the patient was getting  intravenous Solu-Medrol along with aerosol therapies.  Further ER evaluation included chest x-ray, which raise concern for right-sided pleural effusion.  She had a CT angiogram of the chest which was negative for thromboembolic disease; however, the images were reported to be concerning for loculated pleural effusion.  The patient did transport with a CD, presumably containing CT images, which are being sent to radiology to be uploaded.  EVENTS  8/16: Presents to [facility] emergency department with complaints of increased dyspnea and increasing oxygen requirement.  Failed BiPAP support.  Intubated.  ER evaluation revealed a loculated right pleural effusion. 8/17: Transferred to [place] ICU afte rintubation  10/05/2017 -  - on vent. STill hypercapnic. On fent gtt 149mcg -> RASS -2 overnigt. Not on pressors. AFebrile. Making urine. Arrived in ICU few hours ago. No labs yet. On 100% fiow2/peep 5 -> pulse ox 95%  10/15/17 - IR bedside thoracentesis attempt with no fluid produced. Will return for IR guided thoracentesis 8/19.   8/20 - off diprivan. Increaed fio2 90, peep 12, sats ~94%. CXR still with bilateral effusion/consolidation/atelectasis, worse on the right. No fever. Makes urine. On Tube feeds.  8/21 - thoracentesis w/1200cc frank bloody fluid. 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     SUBJECTIVE/OVERNIGHT/INTERVAL HX No events overnight, CXR with concern for a PTX  VITAL SIGNS: Blood pressure (!) 135/51, pulse 86, temperature 100 F (37.8 C), temperature source Oral, resp. rate (!) 30, height 5' (1.524 m), weight 82.3 kg, SpO2 94 %.  Vent Vent Mode: PRVC FiO2 (%):  [80 %-90 %] 80 % Set Rate:  [15 bmp-30 bmp] 30 bmp Vt Set:  [430 mL-620 mL] 430 mL PEEP:  [16 cmH20] 16 cmH20 Plateau Pressure:  [26 cmH20-29 cmH20]  [place] / OUTPUT:  Intake/Output Summary (Last 24 hours) at 10/19/2017 1007 Last data filed at 10/19/2017  0600 Gross per 24 hour  Intake 1844.7 ml  Output 4075 ml  Net -2230.3 ml    PHYSICAL EXAMINATION:  Constitution: Awake and interactive this AM, NAD HEENT: [place]/AT, PERRL, EOM-I and MMM Cardio: RRR, Nl S1/S2 and -M/R/G Respiratory: Coarse BS diffusely Abdominal: Obese, soft, NT, ND and +BS MSK: +pedal pulses, no edema GU: foley in place, yellow urine Neuro: Alert and interactive, moving all ext to command Skin: c/d/i  LABS: Review of labs from [place] (9/32/6712 2119): Sodium 141, potassium 4.4, chloride 98, bicarbonate greater than 40, BUN 16, creatinine 0.6.  Glucose 131.  Calcium 8.7.  Protein 7.4.  Albumin 4.1.  Total bilirubin 0.4.  AST 29.  ALT 25.  Alkaline phosphatase 111.  CARDIAC ENZYMES (10/12/2017 2119): Troponin I less than 0.03.  Broke proBNP 120.  INR 1.1.  PTT 30.8.  D-dimer 510.  CBC (10/12/2017 2119): WBC 11.8 (73% neutrophils, 14% lymphocytes, 8% monocytes, 4% eosinophils, 1% basophils).  Hemoglobin 11.2, hematocrit 35.8.  MCV 97.5.  Platelets 264.  ABG (10/12/2017 0010): pH 7.18, PCO2 101, PO2 74 on BiPAP 16/10, FiO2 80% ABG (10/22/2017 0151): pH 7.10, PCO2 116, PO2 62 on AC 18/425/60/5  PULMONARY Recent Labs  Lab 10/16/17 2257 10/17/17 0108 10/17/17 0405 10/17/17 1312 10/17/17 2030 10/18/17 0400 10/19/17 0232  PHART 7.240* 7.377 7.387 7.426 7.404 7.414 7.448  PCO2ART 85.2* 60.5* 58.4* 51.9* 55.2* 54.6* 52.4*  PO2ART 69.0* 85.0 86.0 80.0* 72.0* 80.5* 65.0*  HCO3 36.5* 35.6* 35.1* 34.2* 33.8* 34.2* 36.1*  TCO2 39* 37* 37* 36*  --   --  38*  O2SAT 89.0 96.0 96.0 96.0 93.2 95.0 92.0   CBC Recent Labs  Lab 10/18/17 0328 10/19/17 0230 10/19/17 0901  HGB 9.3* 9.6* 9.7*  HCT 31.7* 32.9* 33.3*  WBC 13.2* 22.3* 23.3*  PLT 192 207 218   COAGULATION Recent Labs  Lab 10/17/17 0018  INR 1.18   CARDIAC   Recent Labs  Lab 10/20/2017 0658 10/12/2017 1346 10/17/2017 2026  TROPONINI <0.03 <0.03 <0.03   No results  for input(s): PROBNP in the last 168 hours.  CHEMISTRY Recent Labs  Lab 10/15/17 0339 10/15/17 0940 10/16/17 0346 10/17/17 0352 10/18/17 0328 10/19/17 0230  NA  --  142 144 144 145 147*  K  --  3.1* 4.3 3.5 3.4* 3.5  CL  --  99 101 102 103 100  CO2  --  35* 34* 34* 33* 35*  GLUCOSE  --  236* 171* 198* 191* 252*  BUN  --  52* 63* 90* 93* 96*  CREATININE  --  1.52* 1.34* 1.62* 1.33* 1.58*  CALCIUM  --  7.9* 8.5* 8.3* 8.7* 9.3  MG 2.9*  --  2.8* 3.0* 3.0* 2.5*  PHOS 5.6*  --  3.0 4.7* 3.7 4.2   Estimated Creatinine Clearance: 28.2 mL/min (A) (by C-G formula based on SCr of 1.58 mg/dL (H)).  LIVER Recent Labs  Lab 09/30/2017 0658 10/14/17 1443 10/15/17 0339 10/17/17 0018  AST 21  --   --   --   ALT 17  --   --   --   ALKPHOS 85  --   --   --   BILITOT 0.8  --   --   --   PROT 6.8  --  5.2*  --   ALBUMIN 3.3* 3.2*  --   --  INR  --   --   --  1.18   INFECTIOUS Recent Labs  Lab 10/05/2017 0658 10/14/17 0334 10/15/17 0339  LATICACIDVEN 1.2  --   --   PROCALCITON <0.10 <0.10 0.11   ENDOCRINE CBG (last 3)  Recent Labs    10/18/17 2323 10/19/17 0401 10/19/17 0708  GLUCAP 253* 234* 193*   IMAGING x48h  - image(s) personally visualized  -   highlighted in bold  Dg Chest Port 1 View  Result Date: 10/19/2017 CLINICAL DATA:  COPD exacerbation. EXAM: PORTABLE CHEST 1 VIEW COMPARISON:  10/18/2017. FINDINGS: Endotracheal tube, NG tube, left PICC line stable position. Heart size stable. Stable bibasilar atelectasis/infiltrates. Small bilateral pleural effusions. Moderate sized left base pneumothorax appears to be present. IMPRESSION: 1.  New moderate size left base pneumothorax appears to be present. 2. Lines and tubes stable position. Stable bibasilar atelectasis/infiltrates. Small bilateral pleural effusions. Critical Value/emergent results were called by telephone at the time of interpretation on 10/19/2017 at 6:19 am to nurse Izola Price , who verbally acknowledged these results.  Electronically Signed   By: Marcello Moores  Register   On: 10/19/2017 06:21   Dg Chest Port 1 View  Result Date: 10/18/2017 CLINICAL DATA:  Check endotracheal tube placement EXAM: PORTABLE CHEST 1 VIEW COMPARISON:  10/17/2017 FINDINGS: Cardiac shadow is within normal limits. The endotracheal tube, left-sided PICC line and nasogastric catheter are in satisfactory position. Small left pleural effusion is noted new from the prior exam. Increasing left basilar atelectasis is noted. Persistent right basilar atelectasis is seen. IMPRESSION: Increase in left basilar changes with effusion. Electronically Signed   By: Inez Catalina M.D.   On: 10/18/2017 06:53   . sodium chloride 10 mL/hr at 10/19/17 0000  . sodium chloride Stopped (10/19/17 0531)  . dexmedetomidine (PRECEDEX) IV infusion 0.2 mcg/kg/hr (10/19/17 0600)  . feeding supplement (VITAL HIGH PROTEIN) 45 mL/hr at 10/18/17 1800  . fentaNYL infusion INTRAVENOUS 50 mcg/hr (10/19/17 0600)  . furosemide (LASIX) infusion 10 mg/hr (10/19/17 1002)  . norepinephrine (LEVOPHED) Adult infusion Stopped (10/17/17 1712)   ASSESSMENT / PLAN: Principal Problem:   Acute on chronic respiratory failure with hypoxia and hypercapnia (HCC) Active Problems:   Malignant neoplasm of upper-outer quadrant of right breast in female, estrogen receptor positive (HCC)   Breast cancer (HCC)   COPD exacerbation (HCC)   Loculated pleural effusion   Hypertension   Agitation requiring sedation protocol   No contraindication to deep vein thrombosis (DVT) prophylaxis   At risk for stress ulcer   At risk for hyperglycemia   History of sinus tachycardia   ARDS (adult respiratory distress syndrome) (HCC)   Pleural effusion   Septic shock (HCC)   Acute renal failure (HCC)  Acute on chronic respiratory failure with hypoxia and hypercapnia ([place]) Intubated 8/16. Diuresed yesterday w/2.5 L off. CXR still showing increasing pleural effusions with left sided PTX. Fent 51mcg, precedex .2  PVRC RR 30 Vt 430 Fio2 80 PEEP 16 Plan Decrease lasix to 8 mg/hr and continue zaroxolyn BMET in AM Replace electrolytes as indicated STAT Chest CT for ?PTX Titrate sedation down as able Pending culture, cytology, gram stain right bronch wash Am CBC, BMP, Mg, Phos  COPD exacerbation (HCC) No wheezing currently due to steroid and nebs Plan: Tapering solumedrol - 40 q12 today then 20 q12 x2 days then D/C Pulmicort, brovana nebs BID, duonebs q4h RVP negative Lasix as above  Loculated pleural effusion Bedside right-sided thoracentesis 8/21 with 1200cc bloody pleural fluid obtained. Negative pleural fluid  gram stain.  PCT < 0.1. Increasing leukocytosis Plan: Increasing leukocytosis - Blood cx ordered, cbc w/differential  Pleural fluid pH pending - send out lab that likely will be unreliable due to sitting too long Pleural culture and cytology pending  Cefepime day 7/7 complete, d/c on 8/23 PCT is not effected with anything but leukemia and lymphoma so likely effective here. Awaiting cytology final report  Agitation requiring sedation protocol Plan: D/C propofol Fentanyl and precedex as ordered  Malignant neoplasm of upper-outer quadrant of right breast in female, estrogen receptor positive ([place]) Continue arimidex opd med  AKI: Cr 1.58 from 1.3. ~baseline .7; BUN inc  Lasix drip Zaroxolyn as above  Hypertension Monitor  No contraindication to deep vein thrombosis (DVT) prophylaxis Given cancer hx Sq lovenox   At risk for stress ulcer h2 blockade  At risk for hyperglycemia SSI resistant scale q4h  History of sinus tachycardia Takes po dilt at home. resstarted here in hospital via tube   FAMILY  - Updates:--> Plan for further palliative discussion today after cytology results return. Family decided 8/21 to hold cardioversion & CPR  DISPO Keep in ICU  The patient is critically ill with multiple organ systems failure and requires high complexity decision making for  assessment and support, frequent evaluation and titration of therapies, application of advanced monitoring technologies and extensive interpretation of multiple databases.   Critical Care Time devoted to patient care services described in this note is  31  Minutes. This time reflects time of care of this signee Dr [name]. This critical care time does not reflect procedure time, or teaching time or supervisory time of PA/NP/Med student/Med Resident etc but could involve care discussion time.  Rush Farmer, M.D. [facility] Pulmonary/Critical Care Medicine. Pager: [phone]. After hours pager: [phone].

## 2017-10-19 NOTE — Progress Notes (Signed)
HISTORY & PHYSICAL  Patient Name: Ashley Conrad MRN: [id number] DOB: 16-Sep-1945    ADMISSION DATE:  10/22/2017 DATE OF SERVICE:  10/16/2017  CHIEF COMPLAINT: Acute hypoxemic and hypercapnic respiratory; loculated right pleural effusion  BRIEF  Ashley Conrad is 72 y.o. African-American female reformed smoker transferred to the [facility] Emergency Department via EMS from [facility] [place], [place]) with complaints of acute hypoxemic and hypercapnic respiratory failure and a loculated right pleural effusion.  I was contacted by Dr. Ardis Rowan at [facility] with a request to transfer the patient after she presented to [facility] emergency department with complaints of increasing dyspnea and increasing oxygen requirement.  She is a known COPD patient who uses to 5 LPM via nasal cannula at baseline.  Initially, the request was for higher level care in hopes of definitive treatment of a loculated right pleural effusion; however, during her ER stay at [facility], the patient clinically deteriorated and required endotracheal intubation and mechanical ventilatory support.  The patient was recently hospitalized (09/18/2017) at [facility], during which she underwent lumpectomy for ductal invasive carcinoma.  Apparently, the patient reported upon presentation to [facility] emergency department that she did not feel she had done well after her hospital discharge.  She initially presented with respiratory distress and oxygen saturation in the 70s on her 5 LPM via nasal cannula.  She also initially exhibited tachypnea and hypertension; however, as she deteriorated and required BiPAP support she became mildly hypotensive.  Trial of BiPAP therapy was initiated with IPAP 16, EPAP 10, FiO2 80%.  Follow-up ABGs demonstrated failure to improve her hypercapnic respiratory failure.  Conversation with Dr. Rozell Searing suggests that the patient was getting  intravenous Solu-Medrol along with aerosol therapies.  Further ER evaluation included chest x-ray, which raise concern for right-sided pleural effusion.  She had a CT angiogram of the chest which was negative for thromboembolic disease; however, the images were reported to be concerning for loculated pleural effusion.  The patient did transport with a CD, presumably containing CT images, which are being sent to radiology to be uploaded.  EVENTS  8/16: Presents to [facility] emergency department with complaints of increased dyspnea and increasing oxygen requirement.  Failed BiPAP support.  Intubated.  ER evaluation revealed a loculated right pleural effusion. 8/17: Transferred to [place]. [facility] ICU afte rintubation  09/28/2017 -  - on vent. STill hypercapnic. On fent gtt 178mcg -> RASS -2 overnigt. Not on pressors. AFebrile. Making urine. Arrived in ICU few hours ago. No labs yet. On 100% fiow2/peep 5 -> pulse ox 95%  10/15/17 - IR bedside thoracentesis attempt with no fluid produced. Will return for IR guided thoracentesis 8/19.   8/20 - off diprivan. Increaed fio2 90, peep 12, sats ~94%. CXR still with bilateral effusion/consolidation/atelectasis, worse on the right. No fever. Makes urine. On Tube feeds.  8/21 - thoracentesis w/1200cc frank bloody fluid. 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     SUBJECTIVE/OVERNIGHT/INTERVAL HX Exudative effusion from thoracentesis 8/21. Cytology still pending for final results.   VITAL SIGNS: Blood pressure (!) 135/51, pulse 86, temperature 100 F (37.8 C), temperature source Oral, resp. rate (!) 30, height 5' (1.524 m), weight 82.3 kg, SpO2 94 %.  Vent Vent Mode: PRVC FiO2 (%):  [80 %-90 %] 80 % Set Rate:  [15 bmp-30 bmp] 30 bmp Vt Set:  [430 mL-620 mL] 430 mL PEEP:  [16 cmH20-18 cmH20] 16 cmH20 Plateau Pressure:  [  [place] / OUTPUT:  Intake/Output Summary (Last 24 hours) at  10/19/2017 9518 Last data filed at 10/19/2017 0532 Gross per 24 hour  Intake 2222.33 ml  Output 4625 ml  Net -2402.67 ml    PHYSICAL EXAMINATION:  Constitution: NAD, pt awake and responding to questions, obese HEENT: [place]/at Cardio: tachycardic, regular rhythm, no m/r/g Respiratory: decreased breath sounds LLL, otherwise CTAB w/o w/r/r Abdominal: hypoactive BS, distended, soft MSK: +pedal pulses, no edema GU: foley in place, yellow urine Neuro: not sedated, responding to questions by nodding Skin: c/d/i  LABS: Review of labs from [place] (8/41/6606 2119): Sodium 141, potassium 4.4, chloride 98, bicarbonate greater than 40, BUN 16, creatinine 0.6.  Glucose 131.  Calcium 8.7.  Protein 7.4.  Albumin 4.1.  Total bilirubin 0.4.  AST 29.  ALT 25.  Alkaline phosphatase 111.  CARDIAC ENZYMES (10/12/2017 2119): Troponin I less than 0.03.  Broke proBNP 120.  INR 1.1.  PTT 30.8.  D-dimer 510.  CBC (10/12/2017 2119): WBC 11.8 (73% neutrophils, 14% lymphocytes, 8% monocytes, 4% eosinophils, 1% basophils).  Hemoglobin 11.2, hematocrit 35.8.  MCV 97.5.  Platelets 264.  ABG (10/27/2017 0010): pH 7.18, PCO2 101, PO2 74 on BiPAP 16/10, FiO2 80% ABG (10/22/2017 0151): pH 7.10, PCO2 116, PO2 62 on AC 18/425/60/5  PULMONARY Recent Labs  Lab 10/16/17 2257 10/17/17 0108 10/17/17 0405 10/17/17 1312 10/17/17 2030 10/18/17 0400 10/19/17 0232  PHART 7.240* 7.377 7.387 7.426 7.404 7.414 7.448  PCO2ART 85.2* 60.5* 58.4* 51.9* 55.2* 54.6* 52.4*  PO2ART 69.0* 85.0 86.0 80.0* 72.0* 80.5* 65.0*  HCO3 36.5* 35.6* 35.1* 34.2* 33.8* 34.2* 36.1*  TCO2 39* 37* 37* 36*  --   --  38*  O2SAT 89.0 96.0 96.0 96.0 93.2 95.0 92.0   CBC Recent Labs  Lab 10/17/17 0352 10/18/17 0328 10/19/17 0230  HGB 9.6* 9.3* 9.6*  HCT 33.9* 31.7* 32.9*  WBC 17.3* 13.2* 22.3*  PLT 220 192 207   COAGULATION Recent Labs  Lab 10/17/17 0018  INR 1.18   CARDIAC   Recent Labs  Lab  10/16/2017 0658 10/26/2017 1346 10/18/2017 2026  TROPONINI <0.03 <0.03 <0.03   No results for input(s): PROBNP in the last 168 hours.  CHEMISTRY Recent Labs  Lab 10/15/17 0339 10/15/17 0940 10/16/17 0346 10/17/17 0352 10/18/17 0328 10/19/17 0230  NA  --  142 144 144 145 147*  K  --  3.1* 4.3 3.5 3.4* 3.5  CL  --  99 101 102 103 100  CO2  --  35* 34* 34* 33* 35*  GLUCOSE  --  236* 171* 198* 191* 252*  BUN  --  52* 63* 90* 93* 96*  CREATININE  --  1.52* 1.34* 1.62* 1.33* 1.58*  CALCIUM  --  7.9* 8.5* 8.3* 8.7* 9.3  MG 2.9*  --  2.8* 3.0* 3.0* 2.5*  PHOS 5.6*  --  3.0 4.7* 3.7 4.2   Estimated Creatinine Clearance: 28.2 mL/min (A) (by C-G formula based on SCr of 1.58 mg/dL (H)).  LIVER Recent Labs  Lab 10/20/2017 0658 10/14/17 1443 10/15/17 0339 10/17/17 0018  AST 21  --   --   --   ALT 17  --   --   --   ALKPHOS 85  --   --   --   BILITOT 0.8  --   --   --   PROT 6.8  --  5.2*  --   ALBUMIN 3.3* 3.2*  --   --  INR  --   --   --  1.18   INFECTIOUS Recent Labs  Lab 10/16/2017 0658 10/14/17 0334 10/15/17 0339  LATICACIDVEN 1.2  --   --   PROCALCITON <0.10 <0.10 0.11   ENDOCRINE CBG (last 3)  Recent Labs    10/18/17 1929 10/18/17 2323 10/19/17 0401  GLUCAP 114* 253* 234*   IMAGING x48h  - image(s) personally visualized  -   highlighted in bold  Dg Chest Port 1 View  Result Date: 10/18/2017 CLINICAL DATA:  Check endotracheal tube placement EXAM: PORTABLE CHEST 1 VIEW COMPARISON:  10/17/2017 FINDINGS: Cardiac shadow is within normal limits. The endotracheal tube, left-sided PICC line and nasogastric catheter are in satisfactory position. Small left pleural effusion is noted new from the prior exam. Increasing left basilar atelectasis is noted. Persistent right basilar atelectasis is seen. IMPRESSION: Increase in left basilar changes with effusion. Electronically Signed   By: Inez Catalina M.D.   On: 10/18/2017 06:53   Dg Chest Port 1 View  Result Date:  10/17/2017 CLINICAL DATA:  Status post bronchoscopy. EXAM: PORTABLE CHEST 1 VIEW COMPARISON:  Radiograph of same day. FINDINGS: Stable cardiomediastinal silhouette. Endotracheal and nasogastric tubes are unchanged in position. Left-sided PICC line is unchanged in position. No pneumothorax is noted. Stable bibasilar subsegmental atelectasis is noted. No significant pleural effusion is noted. Bony thorax is unremarkable. IMPRESSION: Stable support apparatus. Stable bibasilar subsegmental atelectasis. Electronically Signed   By: Marijo Conception, M.D.   On: 10/17/2017 09:04   Dg Chest Port 1 View  Result Date: 10/17/2017 CLINICAL DATA:  Post right thoracentesis last night. EXAM: PORTABLE CHEST 1 VIEW COMPARISON:  10/17/2016 FINDINGS: Endotracheal tube has tip 3.8 cm above the carina. Nasogastric tube courses into the stomach and off the inferior portion of the film as tip is not visualized. Left-sided PICC line has tip just below the cavoatrial junction. Lungs are adequately inflated demonstrate stable left base opacification likely small effusion with atelectasis. Slight worsening right base opacification likely atelectasis and small amount of pleural fluid. No evidence of pneumothorax. Cardiomediastinal silhouette and remainder of the exam is unchanged. IMPRESSION: Slight worsening opacification right base likely atelectasis with small amount of pleural fluid. Stable left base opacification likely small amount of pleural fluid and atelectasis. No pneumothorax. Tubes and lines as described. Electronically Signed   By: Marin Olp M.D.   On: 10/17/2017 08:34   . sodium chloride 10 mL/hr at 10/19/17 0000  . sodium chloride 10 mL/hr at 10/19/17 0500  . ceFEPime (MAXIPIME) IV 2 g (10/19/17 0532)  . dexmedetomidine (PRECEDEX) IV infusion 0.2 mcg/kg/hr (10/19/17 0547)  . feeding supplement (VITAL HIGH PROTEIN) 45 mL/hr at 10/18/17 1800  . fentaNYL infusion INTRAVENOUS 50 mcg/hr (10/19/17 0500)  . furosemide  (LASIX) infusion 10 mg/hr (10/19/17 0500)  . norepinephrine (LEVOPHED) Adult infusion Stopped (10/17/17 1712)   ASSESSMENT / PLAN: Principal Problem:   Acute on chronic respiratory failure with hypoxia and hypercapnia (HCC) Active Problems:   Malignant neoplasm of upper-outer quadrant of right breast in female, estrogen receptor positive (HCC)   Breast cancer (HCC)   COPD exacerbation (HCC)   Loculated pleural effusion   Hypertension   Agitation requiring sedation protocol   No contraindication to deep vein thrombosis (DVT) prophylaxis   At risk for stress ulcer   At risk for hyperglycemia   History of sinus tachycardia   ARDS (adult respiratory distress syndrome) (HCC)   Pleural effusion   Septic shock (HCC)   Acute renal  failure ([place])  Acute on chronic respiratory failure with hypoxia and hypercapnia ([place]) Intubated 8/16. Diuresed yesterday w/2.5 L off. CXR still showing increasing pleural effusions with left sided PTX. Fent 46mcg, precedex .2 PVRC RR 30 Vt 430 Fio2 80 PEEP 16 Plan Decrease to 8cc/hr lasix gtt for hyperNa, cont. zaroxalyn diuresis Monitor electrolytes - pm BMP  CT scan Titrate sedation down as able Pending culture, cytology, gram stain right bronch wash Am CBC, BMP, Mg, Phos  COPD exacerbation (HCC) No wheezing currently due to steroid and nebs Plan: Tapering solumedrol - 40 q12 today then 20 q12 x2 days then D/C Pulmicort, brovana nebs BID, duonebs q4h RVP negative decrease Lasix 8 mg/hr Repeat Zaroxolyn 5 mg PO x1  Loculated pleural effusion Bedside right-sided thoracentesis 8/21 with 1200cc bloody pleural fluid obtained. Negative pleural fluid gram stain.  PCT < 0.1. Increasing leukocytosis Plan: Increasing leukocytosis - Blood cx ordered, cbc w/differential  Pleural fluid pH pending - send out lab that likely will be unreliable due to sitting too long Pleural culture and cytology pending  Cefepime day 7/7 complete  PCT is not effected with  anything but leukemia and lymphoma so likely effective here.  Agitation requiring sedation protocol Plan: Propofol stopped Fentanyl and precedex drips as ordered  Malignant neoplasm of upper-outer quadrant of right breast in female, estrogen receptor positive ([place]) Continue arimidex opd med  AKI: Cr 1.58 from 1.3. ~baseline .7; BUN inc  Lasix drip Zaroxolyn as above  Hypertension Monitor  No contraindication to deep vein thrombosis (DVT) prophylaxis Given cancer hx Sq lovenox   At risk for stress ulcer h2 blockade  At risk for hyperglycemia SSI resistant scale q4h  History of sinus tachycardia Takes po dilt at home. resstarted here in hospital via tube   FAMILY  - Updates:--> Plan for further palliative discussion today after cytology results return. Family decided 8/21 to hold cardioversion & CPR   DISPO Keep in ICU  Marty Heck, DO 10/19/2017, 6:59 AM Pager: [phone]

## 2017-10-19 NOTE — Procedures (Signed)
Chest Tube Placement  CT placement for PTX while patient is on the ventilator.  Patient sedated.  Area cleaned.  Lidocaine injected, skin incision placed.  Needle placed until air acquired in the left anterior chest followed by wire.  Dilator used then CT placed without difficulty.  Sutured and placed to suction via sahara.  Patient tolerated the procedure well without complications.  Rush Farmer, M.D. [facility] Pulmonary/Critical Care Medicine. Pager: [phone]. After hours pager: [phone].

## 2017-10-19 NOTE — Progress Notes (Signed)
Patient taken to CT and returned to 2M03. No complications. Vital signs stable at this time. RT will continue to monitor.

## 2017-10-20 ENCOUNTER — Inpatient Hospital Stay (HOSPITAL_COMMUNITY): Payer: Medicare Other

## 2017-10-20 DIAGNOSIS — Z515 Encounter for palliative care: Secondary | ICD-10-CM

## 2017-10-20 LAB — POCT I-STAT 3, ART BLOOD GAS (G3+)
Acid-Base Excess: 13 mmol/L — ABNORMAL HIGH (ref 0.0–2.0)
Acid-Base Excess: 14 mmol/L — ABNORMAL HIGH (ref 0.0–2.0)
BICARBONATE: 38.5 mmol/L — AB (ref 20.0–28.0)
Bicarbonate: 40 mmol/L — ABNORMAL HIGH (ref 20.0–28.0)
O2 SAT: 99 %
O2 Saturation: 96 %
PCO2 ART: 56 mmHg — AB (ref 32.0–48.0)
PCO2 ART: 58.8 mmHg — AB (ref 32.0–48.0)
Patient temperature: 99
TCO2: 40 mmol/L — AB (ref 22–32)
TCO2: 42 mmol/L — ABNORMAL HIGH (ref 22–32)
pH, Arterial: 7.445 (ref 7.350–7.450)
pH, Arterial: 7.437 (ref 7.350–7.450)
pO2, Arterial: 126 mmHg — ABNORMAL HIGH (ref 83.0–108.0)
pO2, Arterial: 77 mmHg — ABNORMAL LOW (ref 83.0–108.0)

## 2017-10-20 LAB — BASIC METABOLIC PANEL
Anion gap: 14 (ref 5–15)
BUN: 114 mg/dL — ABNORMAL HIGH (ref 8–23)
CALCIUM: 9.7 mg/dL (ref 8.9–10.3)
CO2: 35 mmol/L — AB (ref 22–32)
CREATININE: 1.88 mg/dL — AB (ref 0.44–1.00)
Chloride: 98 mmol/L (ref 98–111)
GFR calc non Af Amer: 25 mL/min — ABNORMAL LOW (ref >60)
GFR, EST AFRICAN AMERICAN: 29 mL/min — AB (ref >60)
GLUCOSE: 271 mg/dL — AB (ref 70–99)
Potassium: 4.4 mmol/L (ref 3.5–5.1)
Sodium: 147 mmol/L — ABNORMAL HIGH (ref 135–145)

## 2017-10-20 LAB — PHOSPHORUS: Phosphorus: 4 mg/dL (ref 2.5–4.6)

## 2017-10-20 LAB — CBC
HEMATOCRIT: 33.5 % — AB (ref 36.0–46.0)
Hemoglobin: 9.8 g/dL — ABNORMAL LOW (ref 12.0–15.0)
MCH: 31.1 pg (ref 26.0–34.0)
MCHC: 29.3 g/dL — AB (ref 30.0–36.0)
MCV: 106.3 fL — AB (ref 78.0–100.0)
Platelets: 251 10*3/uL (ref 150–400)
RBC: 3.15 MIL/uL — ABNORMAL LOW (ref 3.87–5.11)
RDW: 14.6 % (ref 11.5–15.5)
WBC: 27.6 10*3/uL — ABNORMAL HIGH (ref 4.0–10.5)

## 2017-10-20 LAB — GLUCOSE, CAPILLARY
GLUCOSE-CAPILLARY: 220 mg/dL — AB (ref 70–99)
GLUCOSE-CAPILLARY: 221 mg/dL — AB (ref 70–99)
GLUCOSE-CAPILLARY: 222 mg/dL — AB (ref 70–99)
GLUCOSE-CAPILLARY: 278 mg/dL — AB (ref 70–99)
Glucose-Capillary: 211 mg/dL — ABNORMAL HIGH (ref 70–99)
Glucose-Capillary: 235 mg/dL — ABNORMAL HIGH (ref 70–99)

## 2017-10-20 LAB — MAGNESIUM: Magnesium: 2.4 mg/dL (ref 1.7–2.4)

## 2017-10-20 MED ORDER — SODIUM CHLORIDE 0.9 % IV SOLN
500.0000 mg | Freq: Two times a day (BID) | INTRAVENOUS | Status: DC
  Administered 2017-10-20 – 2017-10-22 (×6): 500 mg via INTRAVENOUS
  Filled 2017-10-20 (×7): qty 0.5

## 2017-10-20 MED ORDER — VANCOMYCIN HCL IN DEXTROSE 750-5 MG/150ML-% IV SOLN
750.0000 mg | INTRAVENOUS | Status: DC
  Administered 2017-10-20 – 2017-10-21 (×2): 750 mg via INTRAVENOUS
  Filled 2017-10-20 (×3): qty 150

## 2017-10-20 NOTE — Progress Notes (Addendum)
ANTIBIOTIC CONSULT NOTE - INITIAL  Pharmacy Consult for Merrem/Vanco Indication: sepsis  Allergies  Allergen Reactions  . Cyclobenzaprine Other (See Comments)    Cognitive issues.  . Shellfish Allergy Itching    Itching    Patient Measurements: Height: 5' (152.4 cm) Weight: 172 lb 6.4 oz (78.2 kg) IBW/kg (Calculated) : 45.5 Adjusted Body Weight:    Vital Signs: Temp: 97.3 F (36.3 C) (08/24 0308) Temp Source: Axillary (08/24 0308) BP: 109/65 (08/24 0900) Pulse Rate: 103 (08/24 0900) Intake/Output from previous day: 08/23 0701 - 08/24 0700 In: 1636.9 [I.V.:736.9; NG/GT:870] Out: 3348 [Urine:3300; Chest Tube:48] Intake/Output from this shift: Total I/O In: 143.2 [I.V.:53.2; NG/GT:90] Out: 625 [Urine:625]  Labs: Recent Labs    10/19/17 0230 10/19/17 0901 10/19/17 1505 10/20/17 0222  WBC 22.3* 23.3*  --  27.6*  HGB 9.6* 9.7*  --  9.8*  PLT 207 218  --  251  CREATININE 1.58*  --  1.71* 1.88*   Estimated Creatinine Clearance: 24.3 mL/min (A) (by C-G formula based on SCr of 1.88 mg/dL (H)). No results for input(s): VANCOTROUGH, VANCOPEAK, VANCORANDOM, GENTTROUGH, GENTPEAK, GENTRANDOM, TOBRATROUGH, TOBRAPEAK, TOBRARND, AMIKACINPEAK, AMIKACINTROU, AMIKACIN in the last 72 hours.   Microbiology:   Medical History: Past Medical History:  Diagnosis Date  . Cancer (HCC)    BREAST  . COPD (chronic obstructive pulmonary disease) ([place])   . Dyspnea   . Family history of breast cancer   . Family history of prostate cancer   . Family history of stomach cancer   . GERD (gastroesophageal reflux disease)   . Headache   . Heart murmur    AGE  14    . History of right breast cancer   . Hypertension   . Oxygen dependent    4 LITERS   . Personal history of breast cancer 08/15/2017    Assessment: ID: Abx for possible PNA, COPD exacerbation., loculated pleural effusion. Afebrile, WBC 27.6 (up on steroids) , LA 1.2, PCT undetectable 0.11. Scr 1.88 up  8/24 Merrem>> 8/17  cefepime>>8/23 8/17 levo>>8/17 8/17 vanc>>8/18, 8/24>>  8/17 strep pneumo: neg 8/17 mrsa pcr: neg 8/17 BCx: negative 8/17 resp panel: neg 8/18 TA: rare GNR, GPC, few candida albicans 8/21: Bronch: few candida albicans 8/21: Pleural fluid 8/23: BC x 2>>  Goal of Therapy:  Eradication of infection  Plan:  Merrem 500mg  IV q 12 hrs Vancomycin 750mg  IV q 24h  Ashley Conrad, PharmD, BCPS Clinical Staff Pharmacist Pager [phone]  Ashley Conrad 10/20/2017,9:44 AM

## 2017-10-20 NOTE — Progress Notes (Signed)
HISTORY & PHYSICAL  Patient Name: Ashley Conrad MRN: [id number] DOB: March 15, 1945    ADMISSION DATE:  10/04/2017 DATE OF SERVICE:  10/16/2017  CHIEF COMPLAINT: Acute hypoxemic and hypercapnic respiratory; loculated right pleural effusion  BRIEF  [name] is 72 y.o. African-American female reformed smoker transferred to the [facility] Emergency Department via EMS from [facility] [place], [place]) with complaints of acute hypoxemic and hypercapnic respiratory failure and a loculated right pleural effusion.  I was contacted by Dr. Ardis Conrad at [facility] with a request to transfer the patient after she presented to [facility] emergency department with complaints of increasing dyspnea and increasing oxygen requirement.  She is a known COPD patient who uses to 5 LPM via nasal cannula at baseline.  Initially, the request was for higher level care in hopes of definitive treatment of a loculated right pleural effusion; however, during her ER stay at [facility], the patient clinically deteriorated and required endotracheal intubation and mechanical ventilatory support.  The patient was recently hospitalized (09/18/2017) at [facility], during which she underwent lumpectomy for ductal invasive carcinoma.  Apparently, the patient reported upon presentation to [facility] emergency department that she did not feel she had done well after her hospital discharge.  She initially presented with respiratory distress and oxygen saturation in the 70s on her 5 LPM via nasal cannula.  She also initially exhibited tachypnea and hypertension; however, as she deteriorated and required BiPAP support she became mildly hypotensive.  Trial of BiPAP therapy was initiated with IPAP 16, EPAP 10, FiO2 80%.  Follow-up ABGs demonstrated failure to improve her hypercapnic respiratory failure.  Conversation with Dr. Rozell Conrad suggests that the patient was getting  intravenous Solu-Medrol along with aerosol therapies.  Further ER evaluation included chest x-ray, which raise concern for right-sided pleural effusion.  She had a CT angiogram of the chest which was negative for thromboembolic disease; however, the images were reported to be concerning for loculated pleural effusion.  The patient did transport with a CD, presumably containing CT images, which are being sent to radiology to be uploaded.  EVENTS  8/16: Presents to [facility] emergency department with complaints of increased dyspnea and increasing oxygen requirement.  Failed BiPAP support.  Intubated.  ER evaluation revealed a loculated right pleural effusion. 8/17: Transferred to [facility] ICU afte rintubation  09/30/2017 -  - on vent. STill hypercapnic. On fent gtt 137mg -> RASS -2 overnigt. Not on pressors. AFebrile. Making urine. Arrived in ICU few hours ago. No labs yet. On 100% fiow2/peep 5 -> pulse ox 95%  10/15/17 - IR bedside thoracentesis attempt with no fluid produced. Will return for IR guided thoracentesis 8/19.   8/20 - off diprivan. Increaed fio2 90, peep 12, sats ~94%. CXR still with bilateral effusion/consolidation/atelectasis, worse on the right. No fever. Makes urine. On Tube feeds.  8/21 - thoracentesis w/1200cc frank bloody fluid. 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     SUBJECTIVE/OVERNIGHT/INTERVAL HX Left pigtail cath chest tube placed yesterday with resolution of pneumothorax. Patient on 7.5 levo overnight, 5 this morning with nml bp.   VITAL SIGNS: Blood pressure (!) 135/51, pulse 86, temperature 100 F (37.8 C), temperature source Oral, resp. rate (!) 30, height 5' (1.524 m), weight 82.3 kg, SpO2 94 %.  Vent Vent Mode: PRVC FiO2 (%):  [70 %-100 %] 70 % Set Rate:  [30 bmp] 30 bmp Vt Set:  [370 mL-430 mL] 430  mL PEEP:  [16 cmH20] 16 cmH20 Plateau Pressure:  [27 cmH20-29 cmH20] 28 cmH20  INTAKE /  OUTPUT:  Intake/Output Summary (Last 24 hours) at 10/20/2017 1037 Last data filed at 10/20/2017 1000 Gross per 24 hour  Intake 1895.57 ml  Output 3493 ml  Net -1597.43 ml   PHYSICAL EXAMINATION:  Constitution: Awake and interactive HEENT: [place]/AT, PERRL, EOM-I and MMM Cardio: RRR, Nl S1/S2 and -M/R/G Respiratory: Coarse BS diffusely Abdominal: Soft, NT, ND and +BS MSK: Moving all ext to command Neuro: Alert and oriented, moving all ext to command Skin: c/d/i   LABS: Review of labs from [place] (8/67/6195 2119): Sodium 141, potassium 4.4, chloride 98, bicarbonate greater than 40, BUN 16, creatinine 0.6.  Glucose 131.  Calcium 8.7.  Protein 7.4.  Albumin 4.1.  Total bilirubin 0.4.  AST 29.  ALT 25.  Alkaline phosphatase 111.  CARDIAC ENZYMES (10/12/2017 2119): Troponin I less than 0.03.  Broke proBNP 120.  INR 1.1.  PTT 30.8.  D-dimer 510.  CBC (10/12/2017 2119): WBC 11.8 (73% neutrophils, 14% lymphocytes, 8% monocytes, 4% eosinophils, 1% basophils).  Hemoglobin 11.2, hematocrit 35.8.  MCV 97.5.  Platelets 264.  ABG (10/02/2017 0010): pH 7.18, PCO2 101, PO2 74 on BiPAP 16/10, FiO2 80% ABG (10/01/2017 0151): pH 7.10, PCO2 116, PO2 62 on AC 18/425/60/5  PULMONARY Recent Labs  Lab 10/17/17 0405 10/17/17 1312 10/17/17 2030 10/18/17 0400 10/19/17 0232 10/20/17 0223 10/20/17 0851  PHART 7.387 7.426 7.404 7.414 7.448 7.445 7.437  PCO2ART 58.4* 51.9* 55.2* 54.6* 52.4* 56.0* 58.8*  PO2ART 86.0 80.0* 72.0* 80.5* 65.0* 126.0* 77.0*  HCO3 35.1* 34.2* 33.8* 34.2* 36.1* 38.5* 40.0*  TCO2 37* 36*  --   --  38* 40* 42*  O2SAT 96.0 96.0 93.2 95.0 92.0 99.0 96.0   CBC Recent Labs  Lab 10/19/17 0230 10/19/17 0901 10/20/17 0222  HGB 9.6* 9.7* 9.8*  HCT 32.9* 33.3* 33.5*  WBC 22.3* 23.3* 27.6*  PLT 207 218 251   COAGULATION Recent Labs  Lab 10/17/17 0018  INR 1.18   CARDIAC   Recent Labs  Lab 10/22/2017 1346 10/18/2017 2026  TROPONINI <0.03  <0.03   No results for input(s): PROBNP in the last 168 hours.  CHEMISTRY Recent Labs  Lab 10/16/17 0346 10/17/17 0352 10/18/17 0328 10/19/17 0230 10/19/17 1505 10/20/17 0222  NA 144 144 145 147* 148* 147*  K 4.3 3.5 3.4* 3.5 3.7 4.4  CL 101 102 103 100 100 98  CO2 34* 34* 33* 35* 36* 35*  GLUCOSE 171* 198* 191* 252* 242* 271*  BUN 63* 90* 93* 96* 104* 114*  CREATININE 1.34* 1.62* 1.33* 1.58* 1.71* 1.88*  CALCIUM 8.5* 8.3* 8.7* 9.3 9.3 9.7  MG 2.8* 3.0* 3.0* 2.5*  --  2.4  PHOS 3.0 4.7* 3.7 4.2  --  4.0   Estimated Creatinine Clearance: 24.3 mL/min (A) (by C-G formula based on SCr of 1.88 mg/dL (H)).  LIVER Recent Labs  Lab 10/14/17 1443 10/15/17 0339 10/17/17 0018  PROT  --  5.2*  --   ALBUMIN 3.2*  --   --   INR  --   --  1.18   INFECTIOUS Recent Labs  Lab 10/14/17 0334 10/15/17 0339  PROCALCITON <0.10 0.11   ENDOCRINE CBG (last 3)  Recent Labs    10/19/17 2257 10/20/17 0259 10/20/17 0734  GLUCAP 268* 211* 221*   IMAGING x48h  - image(s) personally visualized  -   highlighted in bold  Ct Chest Wo  Contrast  Result Date: 10/19/2017 CLINICAL DATA:  Left-sided pneumothorax.  COPD exacerbation. EXAM: CT CHEST WITHOUT CONTRAST TECHNIQUE: Multidetector CT imaging of the chest was performed following the standard protocol without IV contrast. COMPARISON:  Radiographs today.  CT 10/12/2017 and 01/19/2017. FINDINGS: Cardiovascular: Mild atherosclerosis of the aorta, great vessels and coronary arteries. Left arm PICC extends to the superior cavoatrial junction. The heart size is normal. There is no pericardial effusion. Mediastinum/Nodes: There are no enlarged mediastinal, hilar or axillary lymph nodes.Endotracheal tube extends to the mid trachea. Enteric tube extends into the proximal duodenum. The thyroid gland, trachea and esophagus demonstrate no significant findings. Lungs/Pleura: There are small bilateral pleural effusions. As seen on earlier radiographs, there is  a moderate-sized pneumothorax on the left. This has apical and basilar components and is estimated to be approximately 30%. There is severe underlying centrilobular and paraseptal emphysema. There is progressive left lower lobe collapse. Overall aeration of the right lung has improved with mild residual dependent lower lobe atelectasis. There is suspicion of a subpleural or pleural based mass medially in the right upper lobe, measuring 3.3 x 2.5 cm on image 31/5. This is adjacent to the compression deformities of the T3 and T4 vertebral bodies which are chronic, therefore unlikely to reflect hematoma. This could represent residual loculated pleural fluid. Upper abdomen: The visualized upper abdomen demonstrates no acute findings. There is aortic and branch vessel atherosclerosis. Musculoskeletal/Chest wall: Mild generalized subcutaneous edema. There is decreased focal soft tissue swelling laterally in the right breast attributed to previous breast surgery. T3 and T4 compression deformities appear unchanged. Likewise, there is a stable superior endplate compression deformity at T12. There is also a compression deformity at L2 which was not included on the recent chest CT. This was present on the 2018 abdominal CT, although has progressed and is associated with approximately 8 mm of osseous retropulsion. IMPRESSION: 1. CT confirms the presence of a sizable left-sided pneumothorax with subtotal left lower lobe collapse. No mediastinal shift. 2. Underlying severe Emphysema (ICD10-J43.9). 3. Right pleural effusion has decreased in volume since the prior CT. There is a possible right upper lobe subpleural or pleural based mass versus loculated pleural fluid. Attention on follow-up recommended to exclude neoplasm. 4. Multiple thoracolumbar compression deformities. L2 fracture has progressed from 01/19/2017 with associated osseous retropulsion. 5. Satisfactory position of the support system. 6. These results will be called  to the ordering clinician or representative by the Radiologist Assistant, and communication documented in the PACS or zVision Dashboard. Electronically Signed   By: Richardean Sale M.D.   On: 10/19/2017 12:04   Dg Chest Port 1 View  Result Date: 10/20/2017 CLINICAL DATA:  Endotracheal tube. EXAM: PORTABLE CHEST 1 VIEW COMPARISON:  Radiograph and CT scan of October 19, 2017. FINDINGS: The heart size and mediastinal contours are within normal limits. Endotracheal and nasogastric tubes are unchanged in position. Stable position of left apical chest tube is noted. Stable minimal left apical pneumothorax is noted. Stable emphysematous disease is noted in both upper lobes. Stable mass density seen medially in right lung apex. Stable bibasilar subsegmental atelectasis or scarring is noted with possible minimal pleural effusions. The visualized skeletal structures are unremarkable. IMPRESSION: Stable support apparatus. Stable left-sided chest tube is noted with minimal left apical pneumothorax. Stable mass density seen medially in right lung apex. Stable bibasilar subsegmental atelectasis or scarring is noted with probable minimal pleural effusions. Emphysema (ICD10-J43.9). Electronically Signed   By: Marijo Conception, M.D.   On: 10/20/2017 08:32  Dg Chest Port 1 View  Result Date: 10/19/2017 CLINICAL DATA:  Left chest tube. EXAM: PORTABLE CHEST 1 VIEW COMPARISON:  CT 10/19/2017, 10/12/2017. Chest x-ray 10/19/2017. 09/28/2017. FINDINGS: Left chest tube noted over the left upper chest. No pneumothorax left PICC line, NG tube, endotracheal tube in stable position. Stable bilateral pleural fluid collections. Persistent pleural-based density over the right apex again noted. Again this could represent a loculated fluid collection and or tumor. Again as noted on prior CT report of 10/19/2017 continued evaluation on follow-up exams to demonstrate resolution suggested. Heart size stable. No acute bony abnormality.  IMPRESSION: 1. Interim placement of left chest tube and resolution of left-sided pneumothorax. 2.  Left PICC line, NG tube, endotracheal tube in stable position. 3.  Stable bilateral pleural effusions. 4. Persistent right apical pleural-based density again noted. This could represent loculated fluid and or a tumor. Again as noted on prior CT report of 10/19/2017 continued evaluation on follow-up exams to demonstrate resolution suggested. Electronically Signed   By: Marcello Moores  Register   On: 10/19/2017 16:35   Dg Chest Port 1 View  Result Date: 10/19/2017 CLINICAL DATA:  COPD exacerbation. EXAM: PORTABLE CHEST 1 VIEW COMPARISON:  10/18/2017. FINDINGS: Endotracheal tube, NG tube, left PICC line stable position. Heart size stable. Stable bibasilar atelectasis/infiltrates. Small bilateral pleural effusions. Moderate sized left base pneumothorax appears to be present. IMPRESSION: 1.  New moderate size left base pneumothorax appears to be present. 2. Lines and tubes stable position. Stable bibasilar atelectasis/infiltrates. Small bilateral pleural effusions. Critical Value/emergent results were called by telephone at the time of interpretation on 10/19/2017 at 6:19 am to nurse Izola Price , who verbally acknowledged these results. Electronically Signed   By: Marcello Moores  Register   On: 10/19/2017 06:21   . sodium chloride 10 mL/hr at 10/19/17 0000  . sodium chloride 10 mL/hr at 10/20/17 0600  . dexmedetomidine (PRECEDEX) IV infusion 0.5 mcg/kg/hr (10/20/17 0800)  . feeding supplement (VITAL HIGH PROTEIN) 1,000 mL (10/19/17 1950)  . fentaNYL infusion INTRAVENOUS 100 mcg/hr (10/20/17 0600)  . meropenem (MERREM) IV 500 mg (10/20/17 1023)  . norepinephrine (LEVOPHED) Adult infusion Stopped (10/20/17 0800)  . vancomycin     ASSESSMENT / PLAN: Principal Problem:   Acute on chronic respiratory failure with hypoxia and hypercapnia (HCC) Active Problems:   Malignant neoplasm of upper-outer quadrant of right breast in female,  estrogen receptor positive (HCC)   Breast cancer (HCC)   COPD exacerbation (HCC)   Loculated pleural effusion   Hypertension   Agitation requiring sedation protocol   No contraindication to deep vein thrombosis (DVT) prophylaxis   At risk for stress ulcer   At risk for hyperglycemia   History of sinus tachycardia   ARDS (adult respiratory distress syndrome) (HCC)   Pleural effusion   Septic shock (HCC)   Acute renal failure (HCC)   Pneumothorax  Acute on chronic respiratory failure with hypoxia and hypercapnia ([place]) Intubated 8/16. Diuresed yesterday with increasing AKI. CXR with pleural effusion decreased. Pt non-sedated. Fent 100 mcg, precedex .4 Levo 50mg PVRC RR 30 Vt 430 Fio2 80 PEEP 16 Plan Hold lasix given renal function Wean pressors as able Replace electrolytes as indicated Titrate sedation as able Cytology from pleural fluid is consistent with breast cancer Am CBC, BMP, Mg, Phos Titrate O2 and PEEP for sat of 88-92%  COPD exacerbation (HCC) No wheezing currently due to steroid and nebs Plan: Tapering solumedrol - 20 q12 x2 days then D/C Pulmicort, brovana nebs BID, duonebs q4h RVP negative Lasix  on hold given renal function deterioration  Loculated Pleural Effusion Positive for Metastatic Breast Caner Bedside right-sided thoracentesis 8/21 with 1200cc bloody pleural fluid obtained. Negative pleural fluid gram stain.  PCT < 0.1. Increasing leukocytosis. Completed 7 day course cefepime 8/23 Plan: Increasing leukocytosis - Start imipenem - blood cx pending  PCT is not effected with anything but leukemia and lymphoma so likely effective here.  Agitation requiring sedation protocol Plan: D/C propofol Fentanyl and precedex as ordered  Malignant neoplasm of upper-outer quadrant of right breast in female, estrogen receptor positive ([place]) Continue arimidex opd med  AKI: Cr 1.88 from 1.58. ~baseline .7; BUN inc  Hold lasix   Hypertension Monitor  No  contraindication to deep vein thrombosis (DVT) prophylaxis Given cancer hx Sq lovenox   At risk for stress ulcer h2 blockade  At risk for hyperglycemia SSI resistant scale q4h  History of sinus tachycardia Takes po dilt at home. resstarted here in hospital via tube   FAMILY  - Updates:--> Met with the entire family this AM, informed them of malignant cells in the pleural fluid and asked the question of trach/peg, they would like more time to think, asked them to arrive to a decision by tomorrow.  Will plan to meet again in AM.  DISPO Keep in ICU  The patient is critically ill with multiple organ systems failure and requires high complexity decision making for assessment and support, frequent evaluation and titration of therapies, application of advanced monitoring technologies and extensive interpretation of multiple databases.   Critical Care Time devoted to patient care services described in this note is  45  Minutes. This time reflects time of care of this signee Dr [name]. This critical care time does not reflect procedure time, or teaching time or supervisory time of PA/NP/Med student/Med Resident etc but could involve care discussion time.  Rush Farmer, M.D. [facility]. Pager: [phone]. After hours pager: [phone].

## 2017-10-20 NOTE — Progress Notes (Signed)
HISTORY & PHYSICAL  Patient Name: Ashley Conrad MRN: [id number] DOB: January 09, 1946    ADMISSION DATE:  10/21/2017 DATE OF SERVICE:  10/16/2017  CHIEF COMPLAINT: Acute hypoxemic and hypercapnic respiratory; loculated right pleural effusion  BRIEF  Ashley Conrad is 72 y.o. African-American female reformed smoker transferred to the [facility] Emergency Department via EMS from [facility] [place], [place]) with complaints of acute hypoxemic and hypercapnic respiratory failure and a loculated right pleural effusion.  I was contacted by Dr. Ardis Rowan at [facility] with a request to transfer the patient after she presented to [facility] emergency department with complaints of increasing dyspnea and increasing oxygen requirement.  She is a known COPD patient who uses to 5 LPM via nasal cannula at baseline.  Initially, the request was for higher level care in hopes of definitive treatment of a loculated right pleural effusion; however, during her ER stay at [facility], the patient clinically deteriorated and required endotracheal intubation and mechanical ventilatory support.  The patient was recently hospitalized (09/18/2017) at [facility], during which she underwent lumpectomy for ductal invasive carcinoma.  Apparently, the patient reported upon presentation to [facility] emergency department that she did not feel she had done well after her hospital discharge.  She initially presented with respiratory distress and oxygen saturation in the 70s on her 5 LPM via nasal cannula.  She also initially exhibited tachypnea and hypertension; however, as she deteriorated and required BiPAP support she became mildly hypotensive.  Trial of BiPAP therapy was initiated with IPAP 16, EPAP 10, FiO2 80%.  Follow-up ABGs demonstrated failure to improve her hypercapnic respiratory failure.  Conversation with Dr. Rozell Searing suggests that the patient was getting  intravenous Solu-Medrol along with aerosol therapies.  Further ER evaluation included chest x-ray, which raise concern for right-sided pleural effusion.  She had a CT angiogram of the chest which was negative for thromboembolic disease; however, the images were reported to be concerning for loculated pleural effusion.  The patient did transport with a CD, presumably containing CT images, which are being sent to radiology to be uploaded.  EVENTS  8/16: Presents to [facility] emergency department with complaints of increased dyspnea and increasing oxygen requirement.  Failed BiPAP support.  Intubated.  ER evaluation revealed a loculated right pleural effusion. 8/17: Transferred to [place] ICU afte rintubation  10/05/2017 -  - on vent. STill hypercapnic. On fent gtt 157mcg -> RASS -2 overnigt. Not on pressors. AFebrile. Making urine. Arrived in ICU few hours ago. No labs yet. On 100% fiow2/peep 5 -> pulse ox 95%  10/15/17 - IR bedside thoracentesis attempt with no fluid produced. Will return for IR guided thoracentesis 8/19.   8/20 - off diprivan. Increaed fio2 90, peep 12, sats ~94%. CXR still with bilateral effusion/consolidation/atelectasis, worse on the right. No fever. Makes urine. On Tube feeds.  8/21 - thoracentesis w/1200cc frank bloody fluid. 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     SUBJECTIVE/OVERNIGHT/INTERVAL HX Left pigtail cath chest tube placed yesterday with resolution of pneumothorax. Patient on 7.5 levo overnight, 5 this morning with nml bp.   VITAL SIGNS: Blood pressure (!) 135/51, pulse 86, temperature 100 F (37.8 C), temperature source Oral, resp. rate (!) 30, height 5' (1.524 m), weight 82.3 kg, SpO2 94 %.  Vent Vent Mode: PRVC FiO2 (%):  [70 %-100 %] 70 % Set Rate:  [30 bmp] 30 bmp Vt Set:  [430 mL] 430 mL  PEEP:  [16 cmH20] 16 cmH20 Plateau Pressure:  [27 cmH20-29 cmH20] 28 cmH20   INTAKE /  OUTPUT:  Intake/Output Summary (Last 24 hours) at 10/20/2017 0650 Last data filed at 10/20/2017 0600 Gross per 24 hour  Intake 1557 ml  Output 3348 ml  Net -1791 ml    PHYSICAL EXAMINATION:  Constitution: awake, responding, NAD, appears stated age 31: [place]/AT, PERRL Cardio: regular rate & rhythm, no pitting edema Respiratory: course breath sounds on left, no wheezing Abdominal: hypotactive BS,  MSK: moving all extremities, + pedal pulses, warm extremities Neuro: non-sedated, responsive to questions Skin: c/d/i    LABS: Review of labs from [place] (2/70/6237 2119): Sodium 141, potassium 4.4, chloride 98, bicarbonate greater than 40, BUN 16, creatinine 0.6.  Glucose 131.  Calcium 8.7.  Protein 7.4.  Albumin 4.1.  Total bilirubin 0.4.  AST 29.  ALT 25.  Alkaline phosphatase 111.  CARDIAC ENZYMES (10/12/2017 2119): Troponin I less than 0.03.  Broke proBNP 120.  INR 1.1.  PTT 30.8.  D-dimer 510.  CBC (10/12/2017 2119): WBC 11.8 (73% neutrophils, 14% lymphocytes, 8% monocytes, 4% eosinophils, 1% basophils).  Hemoglobin 11.2, hematocrit 35.8.  MCV 97.5.  Platelets 264.  ABG (10/11/2017 0010): pH 7.18, PCO2 101, PO2 74 on BiPAP 16/10, FiO2 80% ABG (09/28/2017 0151): pH 7.10, PCO2 116, PO2 62 on AC 18/425/60/5  PULMONARY Recent Labs  Lab 10/17/17 0108 10/17/17 0405 10/17/17 1312 10/17/17 2030 10/18/17 0400 10/19/17 0232 10/20/17 0223  PHART 7.377 7.387 7.426 7.404 7.414 7.448 7.445  PCO2ART 60.5* 58.4* 51.9* 55.2* 54.6* 52.4* 56.0*  PO2ART 85.0 86.0 80.0* 72.0* 80.5* 65.0* 126.0*  HCO3 35.6* 35.1* 34.2* 33.8* 34.2* 36.1* 38.5*  TCO2 37* 37* 36*  --   --  38* 40*  O2SAT 96.0 96.0 96.0 93.2 95.0 92.0 99.0   CBC Recent Labs  Lab 10/19/17 0230 10/19/17 0901 10/20/17 0222  HGB 9.6* 9.7* 9.8*  HCT 32.9* 33.3* 33.5*  WBC 22.3* 23.3* 27.6*  PLT 207 218 251   COAGULATION Recent Labs  Lab 10/17/17 0018  INR 1.18   CARDIAC   Recent  Labs  Lab 10/17/2017 0658 10/08/2017 1346 10/01/2017 2026  TROPONINI <0.03 <0.03 <0.03   No results for input(s): PROBNP in the last 168 hours.  CHEMISTRY Recent Labs  Lab 10/16/17 0346 10/17/17 0352 10/18/17 0328 10/19/17 0230 10/19/17 1505 10/20/17 0222  NA 144 144 145 147* 148* 147*  K 4.3 3.5 3.4* 3.5 3.7 4.4  CL 101 102 103 100 100 98  CO2 34* 34* 33* 35* 36* 35*  GLUCOSE 171* 198* 191* 252* 242* 271*  BUN 63* 90* 93* 96* 104* 114*  CREATININE 1.34* 1.62* 1.33* 1.58* 1.71* 1.88*  CALCIUM 8.5* 8.3* 8.7* 9.3 9.3 9.7  MG 2.8* 3.0* 3.0* 2.5*  --  2.4  PHOS 3.0 4.7* 3.7 4.2  --  4.0   Estimated Creatinine Clearance: 24.3 mL/min (A) (by C-G formula based on SCr of 1.88 mg/dL (H)).  LIVER Recent Labs  Lab 10/20/2017 0658 10/14/17 1443 10/15/17 0339 10/17/17 0018  AST 21  --   --   --   ALT 17  --   --   --   ALKPHOS 85  --   --   --   BILITOT 0.8  --   --   --   PROT 6.8  --  5.2*  --   ALBUMIN 3.3* 3.2*  --   --   INR  --   --   --  1.[place]  Lab 10/12/2017 0658 10/14/17 0334 10/15/17 0339  LATICACIDVEN 1.2  --   --   PROCALCITON <0.10 <0.10 0.11   ENDOCRINE CBG (last 3)  Recent Labs    10/19/17 1912 10/19/17 2257 10/20/17 0259  GLUCAP 198* 268* 211*   IMAGING x48h  - image(s) personally visualized  -   highlighted in bold  Ct Chest Wo Contrast  Result Date: 10/19/2017 CLINICAL DATA:  Left-sided pneumothorax.  COPD exacerbation. EXAM: CT CHEST WITHOUT CONTRAST TECHNIQUE: Multidetector CT imaging of the chest was performed following the standard protocol without IV contrast. COMPARISON:  Radiographs today.  CT 10/12/2017 and 01/19/2017. FINDINGS: Cardiovascular: Mild atherosclerosis of the aorta, great vessels and coronary arteries. Left arm PICC extends to the superior cavoatrial junction. The heart size is normal. There is no pericardial effusion. Mediastinum/Nodes: There are no enlarged mediastinal, hilar or axillary lymph  nodes.Endotracheal tube extends to the mid trachea. Enteric tube extends into the proximal duodenum. The thyroid gland, trachea and esophagus demonstrate no significant findings. Lungs/Pleura: There are small bilateral pleural effusions. As seen on earlier radiographs, there is a moderate-sized pneumothorax on the left. This has apical and basilar components and is estimated to be approximately 30%. There is severe underlying centrilobular and paraseptal emphysema. There is progressive left lower lobe collapse. Overall aeration of the right lung has improved with mild residual dependent lower lobe atelectasis. There is suspicion of a subpleural or pleural based mass medially in the right upper lobe, measuring 3.3 x 2.5 cm on image 31/5. This is adjacent to the compression deformities of the T3 and T4 vertebral bodies which are chronic, therefore unlikely to reflect hematoma. This could represent residual loculated pleural fluid. Upper abdomen: The visualized upper abdomen demonstrates no acute findings. There is aortic and branch vessel atherosclerosis. Musculoskeletal/Chest wall: Mild generalized subcutaneous edema. There is decreased focal soft tissue swelling laterally in the right breast attributed to previous breast surgery. T3 and T4 compression deformities appear unchanged. Likewise, there is a stable superior endplate compression deformity at T12. There is also a compression deformity at L2 which was not included on the recent chest CT. This was present on the 2018 abdominal CT, although has progressed and is associated with approximately 8 mm of osseous retropulsion. IMPRESSION: 1. CT confirms the presence of a sizable left-sided pneumothorax with subtotal left lower lobe collapse. No mediastinal shift. 2. Underlying severe Emphysema (ICD10-J43.9). 3. Right pleural effusion has decreased in volume since the prior CT. There is a possible right upper lobe subpleural or pleural based mass versus loculated  pleural fluid. Attention on follow-up recommended to exclude neoplasm. 4. Multiple thoracolumbar compression deformities. L2 fracture has progressed from 01/19/2017 with associated osseous retropulsion. 5. Satisfactory position of the support system. 6. These results will be called to the ordering clinician or representative by the Radiologist Assistant, and communication documented in the PACS or zVision Dashboard. Electronically Signed   By: Richardean Sale M.D.   On: 10/19/2017 12:04   Dg Chest Port 1 View  Result Date: 10/19/2017 CLINICAL DATA:  Left chest tube. EXAM: PORTABLE CHEST 1 VIEW COMPARISON:  CT 10/19/2017, 10/12/2017. Chest x-ray 10/19/2017. 09/27/2017. FINDINGS: Left chest tube noted over the left upper chest. No pneumothorax left PICC line, NG tube, endotracheal tube in stable position. Stable bilateral pleural fluid collections. Persistent pleural-based density over the right apex again noted. Again this could represent a loculated fluid collection and or tumor. Again as noted on prior CT report of  10/19/2017 continued evaluation on follow-up exams to demonstrate resolution suggested. Heart size stable. No acute bony abnormality. IMPRESSION: 1. Interim placement of left chest tube and resolution of left-sided pneumothorax. 2.  Left PICC line, NG tube, endotracheal tube in stable position. 3.  Stable bilateral pleural effusions. 4. Persistent right apical pleural-based density again noted. This could represent loculated fluid and or a tumor. Again as noted on prior CT report of 10/19/2017 continued evaluation on follow-up exams to demonstrate resolution suggested. Electronically Signed   By: Marcello Moores  Register   On: 10/19/2017 16:35   Dg Chest Port 1 View  Result Date: 10/19/2017 CLINICAL DATA:  COPD exacerbation. EXAM: PORTABLE CHEST 1 VIEW COMPARISON:  10/18/2017. FINDINGS: Endotracheal tube, NG tube, left PICC line stable position. Heart size stable. Stable bibasilar  atelectasis/infiltrates. Small bilateral pleural effusions. Moderate sized left base pneumothorax appears to be present. IMPRESSION: 1.  New moderate size left base pneumothorax appears to be present. 2. Lines and tubes stable position. Stable bibasilar atelectasis/infiltrates. Small bilateral pleural effusions. Critical Value/emergent results were called by telephone at the time of interpretation on 10/19/2017 at 6:19 am to nurse Izola Price , who verbally acknowledged these results. Electronically Signed   By: Marcello Moores  Register   On: 10/19/2017 06:21   . sodium chloride 10 mL/hr at 10/19/17 0000  . sodium chloride 10 mL/hr at 10/20/17 0600  . dexmedetomidine (PRECEDEX) IV infusion 0.4 mcg/kg/hr (10/20/17 0640)  . feeding supplement (VITAL HIGH PROTEIN) 1,000 mL (10/19/17 1950)  . fentaNYL infusion INTRAVENOUS 100 mcg/hr (10/20/17 0600)  . furosemide (LASIX) infusion 10 mg/hr (10/20/17 0600)  . norepinephrine (LEVOPHED) Adult infusion 5 mcg/min (10/20/17 0600)   ASSESSMENT / PLAN: Principal Problem:   Acute on chronic respiratory failure with hypoxia and hypercapnia (HCC) Active Problems:   Malignant neoplasm of upper-outer quadrant of right breast in female, estrogen receptor positive (HCC)   Breast cancer (HCC)   COPD exacerbation (HCC)   Loculated pleural effusion   Hypertension   Agitation requiring sedation protocol   No contraindication to deep vein thrombosis (DVT) prophylaxis   At risk for stress ulcer   At risk for hyperglycemia   History of sinus tachycardia   ARDS (adult respiratory distress syndrome) (HCC)   Pleural effusion   Septic shock (HCC)   Acute renal failure (HCC)   Pneumothorax  Acute on chronic respiratory failure with hypoxia and hypercapnia ([place]) Intubated 8/16. Diuresed yesterday with increasing AKI. CXR with pleural effusion decreased. Pt non-sedated. Fent 100 mcg, precedex .4 Levo 1mcg PVRC RR 30 Vt 430 Fio2 80 PEEP 16 Plan Hold lasix for increasing AKI  Wean  pressors  Replace electrolytes as indicated Titrate sedation down as able Pending culture, cytology, gram stain right bronch wash Am CBC, BMP, Mg, Phos  COPD exacerbation (HCC) No wheezing currently due to steroid and nebs Plan: Tapering solumedrol - 20 q12 x2 days then D/C Pulmicort, brovana nebs BID, duonebs q4h RVP negative Lasix vaca  Loculated Pleural Effusion Positive for Metastatic Breast Caner Bedside right-sided thoracentesis 8/21 with 1200cc bloody pleural fluid obtained. Negative pleural fluid gram stain.  PCT < 0.1. Increasing leukocytosis. Completed 7 day course cefepime 8/23 Plan: Increasing leukocytosis - Start imipenem - blood cx pending  Pleural fluid pH pending  PCT is not effected with anything but leukemia and lymphoma so likely effective here.  Agitation requiring sedation protocol Plan: D/C propofol Fentanyl and precedex as ordered  Malignant neoplasm of upper-outer quadrant of right breast in female, estrogen receptor positive ([place]) Continue  arimidex opd med  AKI: Cr 1.88 from 1.58. ~baseline .7; BUN inc  Hold lasix  Hypertension Monitor  No contraindication to deep vein thrombosis (DVT) prophylaxis Given cancer hx Sq lovenox   At risk for stress ulcer h2 blockade  At risk for hyperglycemia SSI resistant scale q4h  History of sinus tachycardia Takes po dilt at home. resstarted here in hospital via tube   FAMILY  - Updates:--> Plan for further palliative discussion as patient has metastasis. Family decided 8/21 to hold cardioversion & CPR  DISPO Keep in ICU   Marty Heck, DO 10/20/2017, 6:52 AM Pager: [phone]

## 2017-10-21 ENCOUNTER — Inpatient Hospital Stay (HOSPITAL_COMMUNITY): Payer: Medicare Other

## 2017-10-21 LAB — GLUCOSE, CAPILLARY
GLUCOSE-CAPILLARY: 212 mg/dL — AB (ref 70–99)
Glucose-Capillary: 236 mg/dL — ABNORMAL HIGH (ref 70–99)
Glucose-Capillary: 270 mg/dL — ABNORMAL HIGH (ref 70–99)
Glucose-Capillary: 170 mg/dL — ABNORMAL HIGH (ref 70–99)
Glucose-Capillary: 162 mg/dL — ABNORMAL HIGH (ref 70–99)

## 2017-10-21 LAB — BASIC METABOLIC PANEL
ANION GAP: 11 (ref 5–15)
BUN: 114 mg/dL — AB (ref 8–23)
CALCIUM: 9.4 mg/dL (ref 8.9–10.3)
CO2: 36 mmol/L — AB (ref 22–32)
Chloride: 104 mmol/L (ref 98–111)
Creatinine, Ser: 1.56 mg/dL — ABNORMAL HIGH (ref 0.44–1.00)
GFR calc Af Amer: 37 mL/min — ABNORMAL LOW (ref >60)
GFR calc non Af Amer: 32 mL/min — ABNORMAL LOW (ref >60)
Glucose, Bld: 255 mg/dL — ABNORMAL HIGH (ref 70–99)
Potassium: 4.2 mmol/L (ref 3.5–5.1)
Sodium: 151 mmol/L — ABNORMAL HIGH (ref 135–145)

## 2017-10-21 LAB — MAGNESIUM: MAGNESIUM: 2.4 mg/dL (ref 1.7–2.4)

## 2017-10-21 LAB — CBC WITH DIFFERENTIAL/PLATELET
Abs Immature Granulocytes: 0.3 10*3/uL — ABNORMAL HIGH (ref 0.0–0.1)
BASOS PCT: 0 %
Basophils Absolute: 0.1 10*3/uL (ref 0.0–0.1)
EOS ABS: 0.2 10*3/uL (ref 0.0–0.7)
EOS PCT: 1 %
HEMATOCRIT: 31.8 % — AB (ref 36.0–46.0)
Hemoglobin: 9.3 g/dL — ABNORMAL LOW (ref 12.0–15.0)
IMMATURE GRANULOCYTES: 1 %
LYMPHS ABS: 0.5 10*3/uL — AB (ref 0.7–4.0)
Lymphocytes Relative: 2 %
MCH: 31.4 pg (ref 26.0–34.0)
MCHC: 29.2 g/dL — AB (ref 30.0–36.0)
MCV: 107.4 fL — AB (ref 78.0–100.0)
MONOS PCT: 7 %
Monocytes Absolute: 1.6 10*3/uL — ABNORMAL HIGH (ref 0.1–1.0)
Neutro Abs: 20.5 10*3/uL — ABNORMAL HIGH (ref 1.7–7.7)
Neutrophils Relative %: 89 %
Platelets: 192 10*3/uL (ref 150–400)
RBC: 2.96 MIL/uL — ABNORMAL LOW (ref 3.87–5.11)
RDW: 14.8 % (ref 11.5–15.5)
WBC: 23 10*3/uL — ABNORMAL HIGH (ref 4.0–10.5)

## 2017-10-21 LAB — BLOOD GAS, ARTERIAL
ACID-BASE EXCESS: 11.4 mmol/L — AB (ref 0.0–2.0)
Bicarbonate: 35.9 mmol/L — ABNORMAL HIGH (ref 20.0–28.0)
Drawn by: 51702
FIO2: 70
MECHVT: 430 mL
O2 Saturation: 96.1 %
PEEP/CPAP: 10 cmH2O
PH ART: 7.465 — AB (ref 7.350–7.450)
Patient temperature: 98.6
RATE: 30 resp/min
pCO2 arterial: 50.5 mmHg — ABNORMAL HIGH (ref 32.0–48.0)
pO2, Arterial: 86.9 mmHg (ref 83.0–108.0)

## 2017-10-21 LAB — URINE CULTURE: CULTURE: NO GROWTH

## 2017-10-21 LAB — PHOSPHORUS: Phosphorus: 4.7 mg/dL — ABNORMAL HIGH (ref 2.5–4.6)

## 2017-10-21 MED ORDER — FREE WATER
250.0000 mL | Status: DC
  Administered 2017-10-21 – 2017-10-22 (×7): 250 mL

## 2017-10-21 MED ORDER — STERILE WATER FOR INJECTION IJ SOLN
INTRAMUSCULAR | Status: AC
  Administered 2017-10-21: 5 mL
  Filled 2017-10-21: qty 10

## 2017-10-21 MED ORDER — METOLAZONE 5 MG PO TABS
5.0000 mg | ORAL_TABLET | Freq: Every day | ORAL | Status: AC
  Filled 2017-10-21: qty 1

## 2017-10-21 MED ORDER — FUROSEMIDE 10 MG/ML IJ SOLN
40.0000 mg | Freq: Four times a day (QID) | INTRAMUSCULAR | Status: AC
  Administered 2017-10-21 (×3): 40 mg via INTRAVENOUS
  Filled 2017-10-21 (×3): qty 4

## 2017-10-21 MED ORDER — ACETAZOLAMIDE SODIUM 500 MG IJ SOLR
250.0000 mg | Freq: Four times a day (QID) | INTRAMUSCULAR | Status: DC
  Administered 2017-10-21: 250 mg via INTRAVENOUS
  Filled 2017-10-21: qty 250

## 2017-10-21 MED ORDER — INSULIN GLARGINE 100 UNIT/ML ~~LOC~~ SOLN
5.0000 [IU] | Freq: Every day | SUBCUTANEOUS | Status: DC
  Administered 2017-10-21: 5 [IU] via SUBCUTANEOUS
  Filled 2017-10-21: qty 0.05

## 2017-10-21 MED ORDER — IPRATROPIUM-ALBUTEROL 0.5-2.5 (3) MG/3ML IN SOLN
3.0000 mL | Freq: Four times a day (QID) | RESPIRATORY_TRACT | Status: DC
  Administered 2017-10-21 – 2017-10-23 (×7): 3 mL via RESPIRATORY_TRACT
  Filled 2017-10-21 (×7): qty 3

## 2017-10-21 MED ORDER — ACETAZOLAMIDE SODIUM 500 MG IJ SOLR
250.0000 mg | Freq: Four times a day (QID) | INTRAMUSCULAR | Status: AC
  Administered 2017-10-21 (×2): 250 mg via INTRAVENOUS
  Filled 2017-10-21 (×2): qty 250

## 2017-10-21 MED ORDER — METOLAZONE 5 MG PO TABS
5.0000 mg | ORAL_TABLET | Freq: Once | ORAL | Status: AC
  Administered 2017-10-21: 5 mg via ORAL
  Filled 2017-10-21: qty 1

## 2017-10-21 NOTE — Progress Notes (Signed)
Progress Note  Patient Name: Ashley Conrad MRN: [id number] DOB: Dec 14, 1945    ADMISSION DATE:  09/27/2017 DATE OF SERVICE:  10/16/2017  CHIEF COMPLAINT: Acute hypoxemic and hypercapnic respiratory; loculated right pleural effusion  BRIEF  [name] is 72 y.o. African-American female reformed smoker transferred to the [facility] via EMS from [facility] [place], [place]) with complaints of acute hypoxemic and hypercapnic respiratory failure and a loculated right pleural effusion.  I was contacted by Dr. Ardis Rowan at [facility] with a request to transfer the patient after she presented to [facility] emergency department with complaints of increasing dyspnea and increasing oxygen requirement.  She is a known COPD patient who uses to 5 LPM via nasal cannula at baseline.  Initially, the request was for higher level care in hopes of definitive treatment of a loculated right pleural effusion; however, during her ER stay at [facility], the patient clinically deteriorated and required endotracheal intubation and mechanical ventilatory support.  The patient was recently hospitalized (09/18/2017) at [facility], during which she underwent lumpectomy for ductal invasive carcinoma.  Apparently, the patient reported upon presentation to [facility] emergency department that she did not feel she had done well after her hospital discharge.  She initially presented with respiratory distress and oxygen saturation in the 70s on her 5 LPM via nasal cannula.  She also initially exhibited tachypnea and hypertension; however, as she deteriorated and required BiPAP support she became mildly hypotensive.  Trial of BiPAP therapy was initiated with IPAP 16, EPAP 10, FiO2 80%.  Follow-up ABGs demonstrated failure to improve her hypercapnic respiratory failure.  Conversation with Dr. Rozell Searing suggests that the patient was getting  intravenous Solu-Medrol along with aerosol therapies.  Further ER evaluation included chest x-ray, which raise concern for right-sided pleural effusion.  She had a CT angiogram of the chest which was negative for thromboembolic disease; however, the images were reported to be concerning for loculated pleural effusion.  The patient did transport with a CD, presumably containing CT images, which are being sent to radiology to be uploaded.  EVENTS  8/16: Presents to [facility] emergency department with complaints of increased dyspnea and increasing oxygen requirement.  Failed BiPAP support.  Intubated.  ER evaluation revealed a loculated right pleural effusion. 8/17: Transferred to [facility] ICU afte rintubation  10/11/2017 -  - on vent. STill hypercapnic. On fent gtt 148mcg -> RASS -2 overnigt. Not on pressors. AFebrile. Making urine. Arrived in ICU few hours ago. No labs yet. On 100% fiow2/peep 5 -> pulse ox 95%  10/15/17 - IR bedside thoracentesis attempt with no fluid produced. Will return for IR guided thoracentesis 8/19.   8/20 - off diprivan. Increaed fio2 90, peep 12, sats ~94%. CXR still with bilateral effusion/consolidation/atelectasis, worse on the right. No fever. Makes urine. On Tube feeds.  8/21 - thoracentesis w/1200cc frank bloody fluid. 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     SUBJECTIVE/OVERNIGHT/INTERVAL HX No events overnight, no new complaints, weaning this AM  VITAL SIGNS: Blood pressure (!) 135/51, pulse 86, temperature 100 F (37.8 C), temperature source Oral, resp. rate (!) 30, height 5' (1.524 m), weight 82.3 kg, SpO2 94 %.  Vent Vent Mode: PRVC FiO2 (%):  [70 %] 70 % Set Rate:  [30 bmp] 30 bmp Vt Set:  [430 mL] 430 mL PEEP:  [16 cmH20] 16 cmH20 Plateau Pressure:  [25 cmH20-31 cmH20] 31 cmH20  INTAKE /  OUTPUT:  Intake/Output Summary (Last 24 hours) at 10/21/2017 1062 Last data filed at 10/21/2017 0800 Gross per  24 hour  Intake 1869.33 ml  Output 1644 ml  Net 225.33 ml   PHYSICAL EXAMINATION:  Constitution: Alert and interactive, moving all ext to command HEENT: [place]/AT, PERRL, EOM-I and MMM Cardio: RRR, Nl S1/S2 and -M/R/G Respiratory: Decreased BS diffusely Abdominal: Soft, NT, ND and +BS MSK: Moving all ext to command Neuro: Alert, moving all ext to command Skin: c/d/i   LABS: Review of labs from [place] (6/94/8546 2119): Sodium 141, potassium 4.4, chloride 98, bicarbonate greater than 40, BUN 16, creatinine 0.6.  Glucose 131.  Calcium 8.7.  Protein 7.4.  Albumin 4.1.  Total bilirubin 0.4.  AST 29.  ALT 25.  Alkaline phosphatase 111.  CARDIAC ENZYMES (10/12/2017 2119): Troponin I less than 0.03.  Broke proBNP 120.  INR 1.1.  PTT 30.8.  D-dimer 510.  CBC (10/12/2017 2119): WBC 11.8 (73% neutrophils, 14% lymphocytes, 8% monocytes, 4% eosinophils, 1% basophils).  Hemoglobin 11.2, hematocrit 35.8.  MCV 97.5.  Platelets 264.  ABG (10/08/2017 0010): pH 7.18, PCO2 101, PO2 74 on BiPAP 16/10, FiO2 80% ABG (10/06/2017 0151): pH 7.10, PCO2 116, PO2 62 on AC 18/425/60/5  PULMONARY Recent Labs  Lab 10/17/17 0405 10/17/17 1312  10/18/17 0400 10/19/17 0232 10/20/17 0223 10/20/17 0851 10/21/17 0315  PHART 7.387 7.426   < > 7.414 7.448 7.445 7.437 7.465*  PCO2ART 58.4* 51.9*   < > 54.6* 52.4* 56.0* 58.8* 50.5*  PO2ART 86.0 80.0*   < > 80.5* 65.0* 126.0* 77.0* 86.9  HCO3 35.1* 34.2*   < > 34.2* 36.1* 38.5* 40.0* 35.9*  TCO2 37* 36*  --   --  38* 40* 42*  --   O2SAT 96.0 96.0   < > 95.0 92.0 99.0 96.0 96.1   < > = values in this interval not displayed.   CBC Recent Labs  Lab 10/19/17 0901 10/20/17 0222 10/21/17 0420  HGB 9.7* 9.8* 9.3*  HCT 33.3* 33.5* 31.8*  WBC 23.3* 27.6* 23.0*  PLT 218 251 192   COAGULATION Recent Labs  Lab 10/17/17 0018  INR 1.18   CARDIAC   No results for input(s): TROPONINI in the last 168 hours. No results for  input(s): PROBNP in the last 168 hours.  CHEMISTRY Recent Labs  Lab 10/17/17 0352 10/18/17 0328 10/19/17 0230 10/19/17 1505 10/20/17 0222 10/21/17 0420  NA 144 145 147* 148* 147* 151*  K 3.5 3.4* 3.5 3.7 4.4 4.2  CL 102 103 100 100 98 104  CO2 34* 33* 35* 36* 35* 36*  GLUCOSE 198* 191* 252* 242* 271* 255*  BUN 90* 93* 96* 104* 114* 114*  CREATININE 1.62* 1.33* 1.58* 1.71* 1.88* 1.56*  CALCIUM 8.3* 8.7* 9.3 9.3 9.7 9.4  MG 3.0* 3.0* 2.5*  --  2.4 2.4  PHOS 4.7* 3.7 4.2  --  4.0 4.7*   Estimated Creatinine Clearance: 29.4 mL/min (A) (by C-G formula based on SCr of 1.56 mg/dL (H)).  LIVER Recent Labs  Lab 10/14/17 1443 10/15/17 0339 10/17/17 0018  PROT  --  5.2*  --   ALBUMIN 3.2*  --   --   INR  --   --  1.18   INFECTIOUS Recent Labs  Lab 10/15/17 0339  PROCALCITON 0.11   ENDOCRINE CBG (last 3)  Recent Labs    10/20/17 2338 10/21/17 0419 10/21/17 0706  GLUCAP 222* 236* 270*   IMAGING x48h  - image(s)  personally visualized  -   highlighted in bold  Ct Chest Wo Contrast  Result Date: 10/19/2017 CLINICAL DATA:  Left-sided pneumothorax.  COPD exacerbation. EXAM: CT CHEST WITHOUT CONTRAST TECHNIQUE: Multidetector CT imaging of the chest was performed following the standard protocol without IV contrast. COMPARISON:  Radiographs today.  CT 10/12/2017 and 01/19/2017. FINDINGS: Cardiovascular: Mild atherosclerosis of the aorta, great vessels and coronary arteries. Left arm PICC extends to the superior cavoatrial junction. The heart size is normal. There is no pericardial effusion. Mediastinum/Nodes: There are no enlarged mediastinal, hilar or axillary lymph nodes.Endotracheal tube extends to the mid trachea. Enteric tube extends into the proximal duodenum. The thyroid gland, trachea and esophagus demonstrate no significant findings. Lungs/Pleura: There are small bilateral pleural effusions. As seen on earlier radiographs, there is a moderate-sized pneumothorax on the left.  This has apical and basilar components and is estimated to be approximately 30%. There is severe underlying centrilobular and paraseptal emphysema. There is progressive left lower lobe collapse. Overall aeration of the right lung has improved with mild residual dependent lower lobe atelectasis. There is suspicion of a subpleural or pleural based mass medially in the right upper lobe, measuring 3.3 x 2.5 cm on image 31/5. This is adjacent to the compression deformities of the T3 and T4 vertebral bodies which are chronic, therefore unlikely to reflect hematoma. This could represent residual loculated pleural fluid. Upper abdomen: The visualized upper abdomen demonstrates no acute findings. There is aortic and branch vessel atherosclerosis. Musculoskeletal/Chest wall: Mild generalized subcutaneous edema. There is decreased focal soft tissue swelling laterally in the right breast attributed to previous breast surgery. T3 and T4 compression deformities appear unchanged. Likewise, there is a stable superior endplate compression deformity at T12. There is also a compression deformity at L2 which was not included on the recent chest CT. This was present on the 2018 abdominal CT, although has progressed and is associated with approximately 8 mm of osseous retropulsion. IMPRESSION: 1. CT confirms the presence of a sizable left-sided pneumothorax with subtotal left lower lobe collapse. No mediastinal shift. 2. Underlying severe Emphysema (ICD10-J43.9). 3. Right pleural effusion has decreased in volume since the prior CT. There is a possible right upper lobe subpleural or pleural based mass versus loculated pleural fluid. Attention on follow-up recommended to exclude neoplasm. 4. Multiple thoracolumbar compression deformities. L2 fracture has progressed from 01/19/2017 with associated osseous retropulsion. 5. Satisfactory position of the support system. 6. These results will be called to the ordering clinician or representative  by the Radiologist Assistant, and communication documented in the PACS or zVision Dashboard. Electronically Signed   By: Richardean Sale M.D.   On: 10/19/2017 12:04   Dg Chest Port 1 View  Result Date: 10/21/2017 CLINICAL DATA:  Followup ventilator support EXAM: PORTABLE CHEST 1 VIEW COMPARISON:  10/20/2017.  10/19/2017. FINDINGS: Endotracheal tube tip is 5 cm above the carina. Nasogastric tube enters the abdomen. Left arm PICC tip is in the proximal right atrium. Pigtail catheter at the left pleural apex remains in place. No pleural air is seen. Small bilateral effusions, possibly slightly larger. Basilar atelectasis, slightly worsened. Upper lobe emphysema as seen previously. IMPRESSION: Lines and tubes well positioned.  No visible left pneumothorax. Slight increase in pleural fluid and basilar atelectasis. Electronically Signed   By: Nelson Chimes M.D.   On: 10/21/2017 07:56   Dg Chest Port 1 View  Result Date: 10/20/2017 CLINICAL DATA:  Endotracheal tube. EXAM: PORTABLE CHEST 1 VIEW COMPARISON:  Radiograph and CT scan of  October 19, 2017. FINDINGS: The heart size and mediastinal contours are within normal limits. Endotracheal and nasogastric tubes are unchanged in position. Stable position of left apical chest tube is noted. Stable minimal left apical pneumothorax is noted. Stable emphysematous disease is noted in both upper lobes. Stable mass density seen medially in right lung apex. Stable bibasilar subsegmental atelectasis or scarring is noted with possible minimal pleural effusions. The visualized skeletal structures are unremarkable. IMPRESSION: Stable support apparatus. Stable left-sided chest tube is noted with minimal left apical pneumothorax. Stable mass density seen medially in right lung apex. Stable bibasilar subsegmental atelectasis or scarring is noted with probable minimal pleural effusions. Emphysema (ICD10-J43.9). Electronically Signed   By: Marijo Conception, M.D.   On: 10/20/2017 08:32    Dg Chest Port 1 View  Result Date: 10/19/2017 CLINICAL DATA:  Left chest tube. EXAM: PORTABLE CHEST 1 VIEW COMPARISON:  CT 10/19/2017, 10/12/2017. Chest x-ray 10/19/2017. 10/14/2017. FINDINGS: Left chest tube noted over the left upper chest. No pneumothorax left PICC line, NG tube, endotracheal tube in stable position. Stable bilateral pleural fluid collections. Persistent pleural-based density over the right apex again noted. Again this could represent a loculated fluid collection and or tumor. Again as noted on prior CT report of 10/19/2017 continued evaluation on follow-up exams to demonstrate resolution suggested. Heart size stable. No acute bony abnormality. IMPRESSION: 1. Interim placement of left chest tube and resolution of left-sided pneumothorax. 2.  Left PICC line, NG tube, endotracheal tube in stable position. 3.  Stable bilateral pleural effusions. 4. Persistent right apical pleural-based density again noted. This could represent loculated fluid and or a tumor. Again as noted on prior CT report of 10/19/2017 continued evaluation on follow-up exams to demonstrate resolution suggested. Electronically Signed   By: Marcello Moores  Register   On: 10/19/2017 16:35   . sodium chloride 10 mL/hr at 10/19/17 0000  . sodium chloride 10 mL/hr at 10/21/17 0800  . dexmedetomidine (PRECEDEX) IV infusion Stopped (10/21/17 0736)  . feeding supplement (VITAL HIGH PROTEIN) 1,000 mL (10/20/17 1930)  . fentaNYL infusion INTRAVENOUS 50 mcg/hr (10/21/17 0800)  . meropenem (MERREM) IV Stopped (10/20/17 2215)  . norepinephrine (LEVOPHED) Adult infusion Stopped (10/21/17 0558)  . vancomycin Stopped (10/20/17 1158)   Antibiotics: Cefepime 8/17 >> 8/23 Vanc/meropenem 8/24 >>  ASSESSMENT / PLAN: Principal Problem:   Acute on chronic respiratory failure with hypoxia and hypercapnia (HCC) Active Problems:   Malignant neoplasm of upper-outer quadrant of right breast in female, estrogen receptor positive (HCC)    Breast cancer (HCC)   COPD exacerbation (HCC)   Loculated pleural effusion   Hypertension   Agitation requiring sedation protocol   No contraindication to deep vein thrombosis (DVT) prophylaxis   At risk for stress ulcer   At risk for hyperglycemia   History of sinus tachycardia   ARDS (adult respiratory distress syndrome) (HCC)   Pleural effusion   Septic shock (HCC)   Acute renal failure (HCC)   Pneumothorax  Acute on chronic respiratory failure with hypoxia and hypercapnia ([place]) Intubated 8/16. Diuretics held yesterday for AKI. CXR with pleural effusion increased. Pt non-sedated. Fent 50 mcg, precedex held. Levo held. PVRC RR 30 Vt 430 Fio2 70 PEEP 16 Cytology from pleural fluid is conisistent with breast cancer  Plan Lasix 40 mg IV q6 x3 doses Zaroxolyn 5 mg x1 dose Diamox 250 mg IV q6 x3 doses Wean pressors as able Replace electrolytes as indicated Titrate sedation as able Am CBC, BMP, Mg, Phos Titrate O2 and  PEEP for sat of 88-92%  COPD exacerbation (HCC) No wheezing currently due to steroid and nebs Plan: D/C solumedrol after today's dose Pulmicort, brovana nebs BID, duonebs q4h RVP negative  Loculated Pleural Effusion Positive for Metastatic Breast Caner Bedside right-sided thoracentesis 8/21 with 1200cc bloody pleural fluid obtained. Negative pleural fluid gram stain.  PCT < 0.1. Increasing leukocytosis. Completed 7 day course cefepime 8/23 Plan: Continue merem and vanc PCT is not effected with anything but leukemia and lymphoma so likely effective here.  Agitation requiring sedation protocol Plan: D/C propofol Fentanyl and precedex drips  Malignant neoplasm of upper-outer quadrant of right breast in female, estrogen receptor positive ([place]) Continue arimidex opd med  AKI: Cr 1.56 from 1.88. ~baseline .7; BUN inc  Holding lasix Cont. Diamox, zaroxalyn Am BMP   Hypertension Monitor  No contraindication to deep vein thrombosis (DVT) prophylaxis Given  cancer hx Sq lovenox   At risk for stress ulcer H2 blockade  At risk for hyperglycemia SSI resistant scale q4h Lantus 5U qhs  History of sinus tachycardia Takes po dilt at home. resstarted here in hospital via tube  FAMILY  - Updates: Spoke with the family today, patient is improving, they do not wish for a trach/peg, when optimized will be a 1 way extubation.  DISPO Keep in ICU for today  The patient is critically ill with multiple organ systems failure and requires high complexity decision making for assessment and support, frequent evaluation and titration of therapies, application of advanced monitoring technologies and extensive interpretation of multiple databases.   Critical Care Time devoted to patient care services described in this note is  36  Minutes. This time reflects time of care of this signee Dr [name]. This critical care time does not reflect procedure time, or teaching time or supervisory time of PA/NP/Med student/Med Resident etc but could involve care discussion time.  Rush Farmer, M.D. [facility] Pulmonary/Critical Care Medicine. Pager: [phone]. After hours pager: [phone].

## 2017-10-21 NOTE — Progress Notes (Signed)
HISTORY & PHYSICAL  Patient Name: Ashley Conrad MRN: [id number] DOB: Mar 30, 1945    ADMISSION DATE:  10/12/2017 DATE OF SERVICE:  10/16/2017  CHIEF COMPLAINT: Acute hypoxemic and hypercapnic respiratory; loculated right pleural effusion  BRIEF  [name] is 71 y.o. African-American female reformed smoker transferred to the [facility] Emergency Department via EMS from [facility] [place], [place]) with complaints of acute hypoxemic and hypercapnic respiratory failure and a loculated right pleural effusion.  I was contacted by Dr. Ardis Rowan at [facility] with a request to transfer the patient after she presented to [facility] emergency department with complaints of increasing dyspnea and increasing oxygen requirement.  She is a known COPD patient who uses to 5 LPM via nasal cannula at baseline.  Initially, the request was for higher level care in hopes of definitive treatment of a loculated right pleural effusion; however, during her ER stay at [facility], the patient clinically deteriorated and required endotracheal intubation and mechanical ventilatory support.  The patient was recently hospitalized (09/18/2017) at [facility], during which she underwent lumpectomy for ductal invasive carcinoma.  Apparently, the patient reported upon presentation to [facility] emergency department that she did not feel she had done well after her hospital discharge.  She initially presented with respiratory distress and oxygen saturation in the 70s on her 5 LPM via nasal cannula.  She also initially exhibited tachypnea and hypertension; however, as she deteriorated and required BiPAP support she became mildly hypotensive.  Trial of BiPAP therapy was initiated with IPAP 16, EPAP 10, FiO2 80%.  Follow-up ABGs demonstrated failure to improve her hypercapnic respiratory failure.  Conversation with Dr. Rozell Searing suggests that the patient was getting  intravenous Solu-Medrol along with aerosol therapies.  Further ER evaluation included chest x-ray, which raise concern for right-sided pleural effusion.  She had a CT angiogram of the chest which was negative for thromboembolic disease; however, the images were reported to be concerning for loculated pleural effusion.  The patient did transport with a CD, presumably containing CT images, which are being sent to radiology to be uploaded.  EVENTS  8/16: Presents to [facility] emergency department with complaints of increased dyspnea and increasing oxygen requirement.  Failed BiPAP support.  Intubated.  ER evaluation revealed a loculated right pleural effusion. 8/17: Transferred to [place] ICU afte rintubation  10/17/2017 -  - on vent. STill hypercapnic. On fent gtt 175mg -> RASS -2 overnigt. Not on pressors. AFebrile. Making urine. Arrived in ICU few hours ago. No labs yet. On 100% fiow2/peep 5 -> pulse ox 95%  10/15/17 - IR bedside thoracentesis attempt with no fluid produced. Will return for IR guided thoracentesis 8/19.   8/20 - off diprivan. Increaed fio2 90, peep 12, sats ~94%. CXR still with bilateral effusion/consolidation/atelectasis, worse on the right. No fever. Makes urine. On Tube feeds.  8/21 - thoracentesis w/1200cc frank bloody fluid. 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     SUBJECTIVE/OVERNIGHT/INTERVAL HX Decreased sedation, patient responding to commands and questions. Pt comfortable, denies pain.   VITAL SIGNS: Blood pressure (!) 135/51, pulse 86, temperature 100 F (37.8 C), temperature source Oral, resp. rate (!) 30, height 5' (1.524 m), weight 82.3 kg, SpO2 94 %.  Vent Vent Mode: PRVC FiO2 (%):  [70 %] 70 % Set Rate:  [30 bmp] 30 bmp Vt Set:  [370 mL-430 mL] 430 mL PEEP:  [16 cmH20] 16 cmH20 Plateau Pressure:  [25  cmH20-29 cmH20] [place] / OUTPUT:  Intake/Output Summary (Last 24 hours) at 10/21/2017  6734 Last data filed at 10/21/2017 0400 Gross per 24 hour  Intake 1948.95 ml  Output 1850 ml  Net 98.95 ml   PHYSICAL EXAMINATION:  Constitution: Awake and interactive HEENT: [place]/AT, PERRL, EOM-I and MMM Cardio: RRR, Nl S1/S2 and -M/R/G Respiratory: Decreased breath sounds, tachypnic, shallow breathing Abdominal: Soft, NT, ND and +BS MSK: Moving all ext to command Neuro: Alert, moving all ext to command Skin: c/d/i   LABS: Review of labs from [place] (1/93/7902 2119): Sodium 141, potassium 4.4, chloride 98, bicarbonate greater than 40, BUN 16, creatinine 0.6.  Glucose 131.  Calcium 8.7.  Protein 7.4.  Albumin 4.1.  Total bilirubin 0.4.  AST 29.  ALT 25.  Alkaline phosphatase 111.  CARDIAC ENZYMES (10/12/2017 2119): Troponin I less than 0.03.  Broke proBNP 120.  INR 1.1.  PTT 30.8.  D-dimer 510.  CBC (10/12/2017 2119): WBC 11.8 (73% neutrophils, 14% lymphocytes, 8% monocytes, 4% eosinophils, 1% basophils).  Hemoglobin 11.2, hematocrit 35.8.  MCV 97.5.  Platelets 264.  ABG (10/12/2017 0010): pH 7.18, PCO2 101, PO2 74 on BiPAP 16/10, FiO2 80% ABG (10/22/2017 0151): pH 7.10, PCO2 116, PO2 62 on AC 18/425/60/5  PULMONARY Recent Labs  Lab 10/17/17 0405 10/17/17 1312  10/18/17 0400 10/19/17 0232 10/20/17 0223 10/20/17 0851 10/21/17 0315  PHART 7.387 7.426   < > 7.414 7.448 7.445 7.437 7.465*  PCO2ART 58.4* 51.9*   < > 54.6* 52.4* 56.0* 58.8* 50.5*  PO2ART 86.0 80.0*   < > 80.5* 65.0* 126.0* 77.0* 86.9  HCO3 35.1* 34.2*   < > 34.2* 36.1* 38.5* 40.0* 35.9*  TCO2 37* 36*  --   --  38* 40* 42*  --   O2SAT 96.0 96.0   < > 95.0 92.0 99.0 96.0 96.1   < > = values in this interval not displayed.   CBC Recent Labs  Lab 10/19/17 0901 10/20/17 0222 10/21/17 0420  HGB 9.7* 9.8* 9.3*  HCT 33.3* 33.5* 31.8*  WBC 23.3* 27.6* 23.0*  PLT 218 251 192   COAGULATION Recent Labs  Lab 10/17/17 0018  INR 1.18   CARDIAC   No results for input(s):  TROPONINI in the last 168 hours. No results for input(s): PROBNP in the last 168 hours.  CHEMISTRY Recent Labs  Lab 10/17/17 0352 10/18/17 0328 10/19/17 0230 10/19/17 1505 10/20/17 0222 10/21/17 0420  NA 144 145 147* 148* 147* 151*  K 3.5 3.4* 3.5 3.7 4.4 4.2  CL 102 103 100 100 98 104  CO2 34* 33* 35* 36* 35* 36*  GLUCOSE 198* 191* 252* 242* 271* 255*  BUN 90* 93* 96* 104* 114* 114*  CREATININE 1.62* 1.33* 1.58* 1.71* 1.88* 1.56*  CALCIUM 8.3* 8.7* 9.3 9.3 9.7 9.4  MG 3.0* 3.0* 2.5*  --  2.4 2.4  PHOS 4.7* 3.7 4.2  --  4.0 4.7*   Estimated Creatinine Clearance: 29.3 mL/min (A) (by C-G formula based on SCr of 1.56 mg/dL (H)).  LIVER Recent Labs  Lab 10/14/17 1443 10/15/17 0339 10/17/17 0018  PROT  --  5.2*  --   ALBUMIN 3.2*  --   --   INR  --   --  1.18   INFECTIOUS Recent Labs  Lab 10/15/17 0339  PROCALCITON 0.11   ENDOCRINE CBG (last 3)  Recent Labs    10/20/17 1934 10/20/17 2338 10/21/17 0419  GLUCAP 235* 222* 236*  IMAGING x48h  - image(s) personally visualized  -   highlighted in bold  Ct Chest Wo Contrast  Result Date: 10/19/2017 CLINICAL DATA:  Left-sided pneumothorax.  COPD exacerbation. EXAM: CT CHEST WITHOUT CONTRAST TECHNIQUE: Multidetector CT imaging of the chest was performed following the standard protocol without IV contrast. COMPARISON:  Radiographs today.  CT 10/12/2017 and 01/19/2017. FINDINGS: Cardiovascular: Mild atherosclerosis of the aorta, great vessels and coronary arteries. Left arm PICC extends to the superior cavoatrial junction. The heart size is normal. There is no pericardial effusion. Mediastinum/Nodes: There are no enlarged mediastinal, hilar or axillary lymph nodes.Endotracheal tube extends to the mid trachea. Enteric tube extends into the proximal duodenum. The thyroid gland, trachea and esophagus demonstrate no significant findings. Lungs/Pleura: There are small bilateral pleural effusions. As seen on earlier radiographs,  there is a moderate-sized pneumothorax on the left. This has apical and basilar components and is estimated to be approximately 30%. There is severe underlying centrilobular and paraseptal emphysema. There is progressive left lower lobe collapse. Overall aeration of the right lung has improved with mild residual dependent lower lobe atelectasis. There is suspicion of a subpleural or pleural based mass medially in the right upper lobe, measuring 3.3 x 2.5 cm on image 31/5. This is adjacent to the compression deformities of the T3 and T4 vertebral bodies which are chronic, therefore unlikely to reflect hematoma. This could represent residual loculated pleural fluid. Upper abdomen: The visualized upper abdomen demonstrates no acute findings. There is aortic and branch vessel atherosclerosis. Musculoskeletal/Chest wall: Mild generalized subcutaneous edema. There is decreased focal soft tissue swelling laterally in the right breast attributed to previous breast surgery. T3 and T4 compression deformities appear unchanged. Likewise, there is a stable superior endplate compression deformity at T12. There is also a compression deformity at L2 which was not included on the recent chest CT. This was present on the 2018 abdominal CT, although has progressed and is associated with approximately 8 mm of osseous retropulsion. IMPRESSION: 1. CT confirms the presence of a sizable left-sided pneumothorax with subtotal left lower lobe collapse. No mediastinal shift. 2. Underlying severe Emphysema (ICD10-J43.9). 3. Right pleural effusion has decreased in volume since the prior CT. There is a possible right upper lobe subpleural or pleural based mass versus loculated pleural fluid. Attention on follow-up recommended to exclude neoplasm. 4. Multiple thoracolumbar compression deformities. L2 fracture has progressed from 01/19/2017 with associated osseous retropulsion. 5. Satisfactory position of the support system. 6. These results will  be called to the ordering clinician or representative by the Radiologist Assistant, and communication documented in the PACS or zVision Dashboard. Electronically Signed   By: Richardean Sale M.D.   On: 10/19/2017 12:04   Dg Chest Port 1 View  Result Date: 10/20/2017 CLINICAL DATA:  Endotracheal tube. EXAM: PORTABLE CHEST 1 VIEW COMPARISON:  Radiograph and CT scan of October 19, 2017. FINDINGS: The heart size and mediastinal contours are within normal limits. Endotracheal and nasogastric tubes are unchanged in position. Stable position of left apical chest tube is noted. Stable minimal left apical pneumothorax is noted. Stable emphysematous disease is noted in both upper lobes. Stable mass density seen medially in right lung apex. Stable bibasilar subsegmental atelectasis or scarring is noted with possible minimal pleural effusions. The visualized skeletal structures are unremarkable. IMPRESSION: Stable support apparatus. Stable left-sided chest tube is noted with minimal left apical pneumothorax. Stable mass density seen medially in right lung apex. Stable bibasilar subsegmental atelectasis or scarring is noted with probable minimal pleural  effusions. Emphysema (ICD10-J43.9). Electronically Signed   By: Marijo Conception, M.D.   On: 10/20/2017 08:32   Dg Chest Port 1 View  Result Date: 10/19/2017 CLINICAL DATA:  Left chest tube. EXAM: PORTABLE CHEST 1 VIEW COMPARISON:  CT 10/19/2017, 10/12/2017. Chest x-ray 10/19/2017. 09/29/2017. FINDINGS: Left chest tube noted over the left upper chest. No pneumothorax left PICC line, NG tube, endotracheal tube in stable position. Stable bilateral pleural fluid collections. Persistent pleural-based density over the right apex again noted. Again this could represent a loculated fluid collection and or tumor. Again as noted on prior CT report of 10/19/2017 continued evaluation on follow-up exams to demonstrate resolution suggested. Heart size stable. No acute bony abnormality.  IMPRESSION: 1. Interim placement of left chest tube and resolution of left-sided pneumothorax. 2.  Left PICC line, NG tube, endotracheal tube in stable position. 3.  Stable bilateral pleural effusions. 4. Persistent right apical pleural-based density again noted. This could represent loculated fluid and or a tumor. Again as noted on prior CT report of 10/19/2017 continued evaluation on follow-up exams to demonstrate resolution suggested. Electronically Signed   By: Marcello Moores  Register   On: 10/19/2017 16:35   . sodium chloride 10 mL/hr at 10/19/17 0000  . sodium chloride 10 mL/hr at 10/21/17 0400  . dexmedetomidine (PRECEDEX) IV infusion 0.6 mcg/kg/hr (10/21/17 0559)  . feeding supplement (VITAL HIGH PROTEIN) 1,000 mL (10/20/17 1930)  . fentaNYL infusion INTRAVENOUS 100 mcg/hr (10/21/17 0400)  . meropenem (MERREM) IV Stopped (10/20/17 2215)  . norepinephrine (LEVOPHED) Adult infusion Stopped (10/21/17 0558)  . vancomycin Stopped (10/20/17 1158)   Antibiotics: Cefepime 8/17 >> 8/23 Vanc/meropenem 8/24 >>  ASSESSMENT / PLAN: Principal Problem:   Acute on chronic respiratory failure with hypoxia and hypercapnia (HCC) Active Problems:   Malignant neoplasm of upper-outer quadrant of right breast in female, estrogen receptor positive (HCC)   Breast cancer (HCC)   COPD exacerbation (HCC)   Loculated pleural effusion   Hypertension   Agitation requiring sedation protocol   No contraindication to deep vein thrombosis (DVT) prophylaxis   At risk for stress ulcer   At risk for hyperglycemia   History of sinus tachycardia   ARDS (adult respiratory distress syndrome) (HCC)   Pleural effusion   Septic shock (HCC)   Acute renal failure (HCC)   Pneumothorax  Acute on chronic respiratory failure with hypoxia and hypercapnia ([place]) Intubated 8/16. Diuretics held yesterday for AKI. CXR with pleural effusion increased. Pt non-sedated. Fent 50 mcg, precedex held. Levo held. PVRC RR 30 Vt 430 Fio2 70 PEEP  16 Cytology from pleural fluid is conisistent with breast cancer  Plan Diurese zaroxalyn + diamox Hold lasix for hypernatremia  Wean pressors as able Replace electrolytes as indicated Titrate sedation as able Am CBC, BMP, Mg, Phos Titrate O2 and PEEP for sat of 88-92%  COPD exacerbation (HCC) No wheezing currently due to steroid and nebs Plan: Solumedrol completed today Pulmicort, brovana nebs BID, duonebs q4h RVP negative  Loculated Pleural Effusion Positive for Metastatic Breast Caner Bedside right-sided thoracentesis 8/21 with 1200cc bloody pleural fluid obtained. Negative pleural fluid gram stain.  PCT < 0.1. Increasing leukocytosis. Completed 7 day course cefepime 8/23 Plan: Cont. merepenam & vanc  PCT is not effected with anything but leukemia and lymphoma so likely effective here.  Agitation requiring sedation protocol Plan: D/C propofol Fentanyl and precedex as ordered  Malignant neoplasm of upper-outer quadrant of right breast in female, estrogen receptor positive ([place]) Continue arimidex opd med  AKI: Cr  1.56 from 1.88. ~baseline .7; BUN inc  Holding lasix Cont. Diamox, zaroxalyn Am BMP   Hypertension Monitor  No contraindication to deep vein thrombosis (DVT) prophylaxis Given cancer hx Sq lovenox   At risk for stress ulcer h2 blockade  At risk for hyperglycemia SSI resistant scale q4h lantus 5U qhs  History of sinus tachycardia Takes po dilt at home. resstarted here in hospital via tube   FAMILY  - Updates:--> Met with the entire family yesterday, informed them of malignant cells in the pleural fluid and asked the question of trach/peg, they would like more time to think. Meeting again today for decision.   DISPO Keep in ICU  Marty Heck, DO 10/21/2017, 6:30 AM Pager: [phone]

## 2017-10-22 ENCOUNTER — Inpatient Hospital Stay (HOSPITAL_COMMUNITY): Payer: Medicare Other

## 2017-10-22 LAB — GLUCOSE, CAPILLARY
GLUCOSE-CAPILLARY: 170 mg/dL — AB (ref 70–99)
GLUCOSE-CAPILLARY: 180 mg/dL — AB (ref 70–99)
Glucose-Capillary: 115 mg/dL — ABNORMAL HIGH (ref 70–99)
Glucose-Capillary: 115 mg/dL — ABNORMAL HIGH (ref 70–99)
Glucose-Capillary: 118 mg/dL — ABNORMAL HIGH (ref 70–99)
Glucose-Capillary: 189 mg/dL — ABNORMAL HIGH (ref 70–99)
Glucose-Capillary: 253 mg/dL — ABNORMAL HIGH (ref 70–99)

## 2017-10-22 LAB — CULTURE, BODY FLUID-BOTTLE: CULTURE: NO GROWTH

## 2017-10-22 LAB — CBC
HEMATOCRIT: 33.3 % — AB (ref 36.0–46.0)
HEMOGLOBIN: 9.5 g/dL — AB (ref 12.0–15.0)
MCH: 31.4 pg (ref 26.0–34.0)
MCHC: 28.5 g/dL — ABNORMAL LOW (ref 30.0–36.0)
MCV: 109.9 fL — ABNORMAL HIGH (ref 78.0–100.0)
Platelets: 181 10*3/uL (ref 150–400)
RBC: 3.03 MIL/uL — ABNORMAL LOW (ref 3.87–5.11)
RDW: 14.7 % (ref 11.5–15.5)
WBC: 24.2 10*3/uL — ABNORMAL HIGH (ref 4.0–10.5)

## 2017-10-22 LAB — POCT I-STAT 3, ART BLOOD GAS (G3+)
ACID-BASE EXCESS: 10 mmol/L — AB (ref 0.0–2.0)
BICARBONATE: 36.5 mmol/L — AB (ref 20.0–28.0)
O2 Saturation: 92 %
TCO2: 38 mmol/L — AB (ref 22–32)
pCO2 arterial: 58.4 mmHg — ABNORMAL HIGH (ref 32.0–48.0)
pH, Arterial: 7.403 (ref 7.350–7.450)
pO2, Arterial: 67 mmHg — ABNORMAL LOW (ref 83.0–108.0)

## 2017-10-22 LAB — BASIC METABOLIC PANEL
ANION GAP: 9 (ref 5–15)
BUN: 103 mg/dL — ABNORMAL HIGH (ref 8–23)
CALCIUM: 9.5 mg/dL (ref 8.9–10.3)
CHLORIDE: 100 mmol/L (ref 98–111)
CO2: 37 mmol/L — AB (ref 22–32)
Creatinine, Ser: 1.52 mg/dL — ABNORMAL HIGH (ref 0.44–1.00)
GFR calc non Af Amer: 33 mL/min — ABNORMAL LOW (ref >60)
GFR, EST AFRICAN AMERICAN: 38 mL/min — AB (ref >60)
Glucose, Bld: 228 mg/dL — ABNORMAL HIGH (ref 70–99)
Potassium: 3.5 mmol/L (ref 3.5–5.1)
SODIUM: 146 mmol/L — AB (ref 135–145)

## 2017-10-22 LAB — CULTURE, BODY FLUID W GRAM STAIN -BOTTLE

## 2017-10-22 LAB — MAGNESIUM: MAGNESIUM: 2.2 mg/dL (ref 1.7–2.4)

## 2017-10-22 LAB — PHOSPHORUS: Phosphorus: 5.9 mg/dL — ABNORMAL HIGH (ref 2.5–4.6)

## 2017-10-22 MED ORDER — ORAL CARE MOUTH RINSE
15.0000 mL | Freq: Two times a day (BID) | OROMUCOSAL | Status: DC
  Administered 2017-10-23: 15 mL via OROMUCOSAL

## 2017-10-22 MED ORDER — INSULIN GLARGINE 100 UNIT/ML ~~LOC~~ SOLN: 15.0000 [IU] | Freq: Every day | SUBCUTANEOUS | Status: DC

## 2017-10-22 MED ORDER — MORPHINE SULFATE (PF) 2 MG/ML IV SOLN
1.0000 mg | INTRAVENOUS | Status: DC | PRN
  Administered 2017-10-22 – 2017-10-23 (×3): 2 mg via INTRAVENOUS
  Filled 2017-10-22 (×3): qty 1

## 2017-10-22 MED ORDER — INSULIN GLARGINE 100 UNIT/ML ~~LOC~~ SOLN: 6.0000 [IU] | Freq: Every day | SUBCUTANEOUS | Status: DC

## 2017-10-22 MED ORDER — INSULIN GLARGINE 100 UNIT/ML ~~LOC~~ SOLN
7.0000 [IU] | Freq: Two times a day (BID) | SUBCUTANEOUS | Status: AC
  Administered 2017-10-22 (×2): 7 [IU] via SUBCUTANEOUS
  Filled 2017-10-22 (×2): qty 0.07

## 2017-10-22 MED ORDER — HYDRALAZINE HCL 20 MG/ML IJ SOLN
10.0000 mg | INTRAMUSCULAR | Status: DC | PRN
  Administered 2017-10-22: 20 mg via INTRAVENOUS
  Filled 2017-10-22: qty 1

## 2017-10-22 MED ORDER — WHITE PETROLATUM EX OINT
TOPICAL_OINTMENT | CUTANEOUS | Status: AC
  Administered 2017-10-22: 0.2
  Filled 2017-10-22: qty 28.35

## 2017-10-22 MED ORDER — METOLAZONE 5 MG PO TABS
5.0000 mg | ORAL_TABLET | Freq: Once | ORAL | Status: AC
  Administered 2017-10-22: 5 mg via ORAL
  Filled 2017-10-22: qty 1

## 2017-10-22 MED ORDER — FUROSEMIDE 10 MG/ML IJ SOLN
40.0000 mg | Freq: Four times a day (QID) | INTRAMUSCULAR | Status: DC
  Administered 2017-10-22 (×2): 40 mg via INTRAVENOUS
  Filled 2017-10-22 (×3): qty 4

## 2017-10-22 MED ORDER — CHLORHEXIDINE GLUCONATE 0.12 % MT SOLN
15.0000 mL | Freq: Two times a day (BID) | OROMUCOSAL | Status: DC
  Administered 2017-10-23: 15 mL via OROMUCOSAL

## 2017-10-22 MED ORDER — ACETAZOLAMIDE SODIUM 500 MG IJ SOLR
250.0000 mg | Freq: Three times a day (TID) | INTRAMUSCULAR | Status: AC
  Administered 2017-10-22 (×3): 250 mg via INTRAVENOUS
  Filled 2017-10-22 (×3): qty 250

## 2017-10-22 MED ORDER — METOPROLOL TARTRATE 5 MG/5ML IV SOLN
2.5000 mg | Freq: Four times a day (QID) | INTRAVENOUS | Status: DC
  Administered 2017-10-22 – 2017-10-23 (×4): 2.5 mg via INTRAVENOUS
  Filled 2017-10-22 (×4): qty 5

## 2017-10-22 MED ORDER — POTASSIUM CHLORIDE 20 MEQ PO PACK
20.0000 meq | PACK | Freq: Three times a day (TID) | ORAL | Status: DC
  Administered 2017-10-22: 20 meq via ORAL
  Filled 2017-10-22 (×4): qty 1

## 2017-10-22 MED ORDER — FUROSEMIDE 10 MG/ML IJ SOLN
80.0000 mg | Freq: Once | INTRAMUSCULAR | Status: AC
  Administered 2017-10-22: 80 mg via INTRAVENOUS
  Filled 2017-10-22: qty 8

## 2017-10-22 MED ORDER — MORPHINE BOLUS VIA INFUSION: 1.0000 mg | INTRAVENOUS | Status: DC | PRN

## 2017-10-22 MED ORDER — HYDRALAZINE HCL 20 MG/ML IJ SOLN
20.0000 mg | Freq: Once | INTRAMUSCULAR | Status: AC
  Administered 2017-10-22: 20 mg via INTRAVENOUS
  Filled 2017-10-22: qty 1

## 2017-10-22 MED ORDER — MORPHINE SULFATE (PF) 2 MG/ML IV SOLN
1.0000 mg | INTRAVENOUS | Status: DC | PRN
  Administered 2017-10-22 (×2): 1 mg via INTRAVENOUS
  Filled 2017-10-22 (×2): qty 1

## 2017-10-22 NOTE — Progress Notes (Signed)
PT Cancellation Note  Patient Details Name: Ashley Conrad MRN: [id number] DOB: 23-Jun-1945   Cancelled Treatment:    Reason Eval/Treat Not Completed: Medical issues which prohibited therapy.  See for plans to extubate today with no re-intubation after (see Dr. Pura Spice note from today).  Will await to see if extubation is successful or if comfort care is the plan.  PT to check back tomorrow.  Thanks,    Barbarann Ehlers. Dorris, [place], DPT [phone]   10/22/2017, 10:52 AM

## 2017-10-22 NOTE — Progress Notes (Signed)
61mL fentanyl wasted in sink witnessed by Providence Lanius, Therapist, sports

## 2017-10-22 NOTE — Progress Notes (Signed)
Family arrived, had a discussion regarding trach/peg, after a long discussion decision was made not to proceed with that, patient is ready for extubation, will extubate today with no intention to reintubate.  If decompensates then proceed with comfort care.  Rush Farmer, M.D. [facility] Pulmonary/Critical Care Medicine. Pager: [phone]. After hours pager: [phone].

## 2017-10-22 NOTE — Progress Notes (Signed)
HISTORY & PHYSICAL  Patient Name: Ashley Conrad MRN: [id number] DOB: [birth year]    ADMISSION DATE:  10/05/2017 DATE OF SERVICE:  10/16/2017  CHIEF COMPLAINT: Acute hypoxemic and hypercapnic respiratory; loculated right pleural effusion  BRIEF  [name] is 72 y.o. African-American female reformed smoker transferred to the [facility] Emergency Department via EMS from [facility] [place], [place]) with complaints of acute hypoxemic and hypercapnic respiratory failure and a loculated right pleural effusion.  I was contacted by Dr. Ardis Rowan at [facility] with a request to transfer the patient after she presented to [facility] emergency department with complaints of increasing dyspnea and increasing oxygen requirement.  She is a known COPD patient who uses to 5 LPM via nasal cannula at baseline.  Initially, the request was for higher level care in hopes of definitive treatment of a loculated right pleural effusion; however, during her ER stay at [facility], the patient clinically deteriorated and required endotracheal intubation and mechanical ventilatory support.  The patient was recently hospitalized (09/18/2017) at [facility], during which she underwent lumpectomy for ductal invasive carcinoma.  Apparently, the patient reported upon presentation to [facility] emergency department that she did not feel she had done well after her hospital discharge.  She initially presented with respiratory distress and oxygen saturation in the 70s on her 5 LPM via nasal cannula.  She also initially exhibited tachypnea and hypertension; however, as she deteriorated and required BiPAP support she became mildly hypotensive.  Trial of BiPAP therapy was initiated with IPAP 16, EPAP 10, FiO2 80%.  Follow-up ABGs demonstrated failure to improve her hypercapnic respiratory failure.  Conversation with Dr. Rozell Searing suggests that the patient was getting  intravenous Solu-Medrol along with aerosol therapies.  Further ER evaluation included chest x-ray, which raise concern for right-sided pleural effusion.  She had a CT angiogram of the chest which was negative for thromboembolic disease; however, the images were reported to be concerning for loculated pleural effusion.  The patient did transport with a CD, presumably containing CT images, which are being sent to radiology to be uploaded.  EVENTS  8/16: Presents to [facility] emergency department with complaints of increased dyspnea and increasing oxygen requirement.  Failed BiPAP support.  Intubated.  ER evaluation revealed a loculated right pleural effusion. 8/17: Transferred to [place]. [facility] ICU afte rintubation  10/06/2017 -  - on vent. STill hypercapnic. On fent gtt 128mcg -> RASS -2 overnigt. Not on pressors. AFebrile. Making urine. Arrived in ICU few hours ago. No labs yet. On 100% fiow2/peep 5 -> pulse ox 95%  10/15/17 - IR bedside thoracentesis attempt with no fluid produced. Will return for IR guided thoracentesis 8/19.   8/20 - off diprivan. Increaed fio2 90, peep 12, sats ~94%. CXR still with bilateral effusion/consolidation/atelectasis, worse on the right. No fever. Makes urine. On Tube feeds.  8/21 - thoracentesis w/1200cc frank bloody fluid. 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     SUBJECTIVE/OVERNIGHT/INTERVAL HX Decreased sedation, patient responding to commands and questions. Pt comfortable, denies pain.   VITAL SIGNS: Blood pressure (!) 135/51, pulse 86, temperature 100 F (37.8 C), temperature source Oral, resp. rate (!) 30, height 5' (1.524 m), weight 82.3 kg, SpO2 94 %.  Vent Vent Mode: PRVC FiO2 (%):  [40 %-50 %] 40 % Set Rate:  [30 bmp] 30 bmp Vt Set:  [430 mL] 430 mL PEEP:  [10 cmH20] 10 cmH20 Plateau Pressure:  [21  cmH20-22 cmH20] 22 cmH20  INTAKE / OUTPUT:  Intake/Output Summary (Last 24 hours) at 10/22/2017  3790 Last data filed at 10/22/2017 0800 Gross per 24 hour  Intake 3423.83 ml  Output 3735 ml  Net -311.17 ml   PHYSICAL EXAMINATION:  Constitution: Awake and interactive HEENT: [place]/AT, PERRL, EOM-I and MMM Cardio: RRR, Nl S1/S2 and -M/R/G Respiratory: Decreased breath sounds, tachypnic, shallow breathing Abdominal: Soft, NT, ND and +BS MSK: Moving all ext to command Neuro: Alert, moving all ext to command Skin: c/d/i   LABS: Review of labs from [place] (2/40/9735 2119): Sodium 141, potassium 4.4, chloride 98, bicarbonate greater than 40, BUN 16, creatinine 0.6.  Glucose 131.  Calcium 8.7.  Protein 7.4.  Albumin 4.1.  Total bilirubin 0.4.  AST 29.  ALT 25.  Alkaline phosphatase 111.  CARDIAC ENZYMES (10/12/2017 2119): Troponin I less than 0.03.  Broke proBNP 120.  INR 1.1.  PTT 30.8.  D-dimer 510.  CBC (10/12/2017 2119): WBC 11.8 (73% neutrophils, 14% lymphocytes, 8% monocytes, 4% eosinophils, 1% basophils).  Hemoglobin 11.2, hematocrit 35.8.  MCV 97.5.  Platelets 264.  ABG (10/11/2017 0010): pH 7.18, PCO2 101, PO2 74 on BiPAP 16/10, FiO2 80% ABG (10/27/2017 0151): pH 7.10, PCO2 116, PO2 62 on AC 18/425/60/5  PULMONARY Recent Labs  Lab 10/17/17 1312  10/19/17 0232 10/20/17 0223 10/20/17 0851 10/21/17 0315 10/22/17 0322  PHART 7.426   < > 7.448 7.445 7.437 7.465* 7.403  PCO2ART 51.9*   < > 52.4* 56.0* 58.8* 50.5* 58.4*  PO2ART 80.0*   < > 65.0* 126.0* 77.0* 86.9 67.0*  HCO3 34.2*   < > 36.1* 38.5* 40.0* 35.9* 36.5*  TCO2 36*  --  38* 40* 42*  --  38*  O2SAT 96.0   < > 92.0 99.0 96.0 96.1 92.0   < > = values in this interval not displayed.   CBC Recent Labs  Lab 10/20/17 0222 10/21/17 0420 10/22/17 0343  HGB 9.8* 9.3* 9.5*  HCT 33.5* 31.8* 33.3*  WBC 27.6* 23.0* 24.2*  PLT 251 192 181   COAGULATION Recent Labs  Lab 10/17/17 0018  INR 1.18   CARDIAC   No results for input(s): TROPONINI in the last 168 hours. No results  for input(s): PROBNP in the last 168 hours.  CHEMISTRY Recent Labs  Lab 10/18/17 0328 10/19/17 0230 10/19/17 1505 10/20/17 0222 10/21/17 0420 10/22/17 0343  NA 145 147* 148* 147* 151* 146*  K 3.4* 3.5 3.7 4.4 4.2 3.5  CL 103 100 100 98 104 100  CO2 33* 35* 36* 35* 36* 37*  GLUCOSE 191* 252* 242* 271* 255* 228*  BUN 93* 96* 104* 114* 114* 103*  CREATININE 1.33* 1.58* 1.71* 1.88* 1.56* 1.52*  CALCIUM 8.7* 9.3 9.3 9.7 9.4 9.5  MG 3.0* 2.5*  --  2.4 2.4 2.2  PHOS 3.7 4.2  --  4.0 4.7* 5.9*   Estimated Creatinine Clearance: 29.5 mL/min (A) (by C-G formula based on SCr of 1.52 mg/dL (H)).  LIVER Recent Labs  Lab 10/17/17 0018  INR 1.18   INFECTIOUS No results for input(s): LATICACIDVEN, PROCALCITON in the last 168 hours. ENDOCRINE CBG (last 3)  Recent Labs    10/21/17 2347 10/22/17 0338 10/22/17 0713  GLUCAP 180* 189* 253*   IMAGING x48h  - image(s) personally visualized  -   highlighted in bold  Dg Chest Port 1 View  Result Date: 10/22/2017 CLINICAL DATA:  History of endotracheal tube. EXAM: PORTABLE CHEST 1 VIEW COMPARISON:  10/21/2017 FINDINGS: Endotracheal tube is 4.5 cm above the carina. Nasogastric tube extends into the abdomen. Again noted is a pigtail chest tube at the left apex region. No evidence for pneumothorax. There is a left arm PICC line and the tip extends into the right atrium. Persistent basilar chest densities, right side greater than left with underlying emphysematous changes. Heart and mediastinum are stable. IMPRESSION: Stable position of the left chest tube near the left lung apex. Negative for pneumothorax. Persistent basilar chest densities, right side greater than left, and minimally changed. PICC line tip in the right atrium. PICC line could be pulled back approximately 4 cm for positioning at the superior cavoatrial junction. Electronically Signed   By: Markus Daft M.D.   On: 10/22/2017 07:05   Dg Chest Port 1 View  Result Date:  10/21/2017 CLINICAL DATA:  Followup ventilator support EXAM: PORTABLE CHEST 1 VIEW COMPARISON:  10/20/2017.  10/19/2017. FINDINGS: Endotracheal tube tip is 5 cm above the carina. Nasogastric tube enters the abdomen. Left arm PICC tip is in the proximal right atrium. Pigtail catheter at the left pleural apex remains in place. No pleural air is seen. Small bilateral effusions, possibly slightly larger. Basilar atelectasis, slightly worsened. Upper lobe emphysema as seen previously. IMPRESSION: Lines and tubes well positioned.  No visible left pneumothorax. Slight increase in pleural fluid and basilar atelectasis. Electronically Signed   By: Nelson Chimes M.D.   On: 10/21/2017 07:56   . sodium chloride 10 mL/hr at 10/19/17 0000  . sodium chloride 10 mL/hr at 10/22/17 0800  . dexmedetomidine (PRECEDEX) IV infusion 0.6 mcg/kg/hr (10/22/17 0802)  . feeding supplement (VITAL HIGH PROTEIN) 1,000 mL (10/21/17 2015)  . fentaNYL infusion INTRAVENOUS 50 mcg/hr (10/22/17 0800)  . meropenem (MERREM) IV Stopped (10/21/17 2231)  . norepinephrine (LEVOPHED) Adult infusion Stopped (10/21/17 0558)  . vancomycin 750 mg (10/21/17 1042)   Antibiotics: Cefepime 8/17 >> 8/23 Vanc/meropenem 8/24 >>  ASSESSMENT / PLAN: Principal Problem:   Acute on chronic respiratory failure with hypoxia and hypercapnia (HCC) Active Problems:   Malignant neoplasm of upper-outer quadrant of right breast in female, estrogen receptor positive (HCC)   Breast cancer (HCC)   COPD exacerbation (HCC)   Loculated pleural effusion   Hypertension   Agitation requiring sedation protocol   No contraindication to deep vein thrombosis (DVT) prophylaxis   At risk for stress ulcer   At risk for hyperglycemia   History of sinus tachycardia   ARDS (adult respiratory distress syndrome) (HCC)   Pleural effusion   Septic shock (HCC)   Acute renal failure (HCC)   Pneumothorax  Acute on chronic respiratory failure with hypoxia and hypercapnia  ([place]) Intubated 8/16. Diuretics held yesterday for AKI. CXR with pleural effusion increased. Pt non-sedated. Fent 50 mcg, precedex held. Levo held. PVRC RR 30 Vt 430 Fio2 70 PEEP 16 Cytology from pleural fluid is conisistent with breast cancer  Plan Lasix, zaroxolyn and diamox as ordered Replete K more Wean pressors as able Replace electrolytes as indicated Precedex and fentanyl Am CBC, BMP, Mg, Phos Titrate O2 and PEEP for sat of 88-92% SBT for potential extubation today  COPD exacerbation (HCC) No wheezing currently due to steroid and nebs. Solumedrol complete Plan: Pulmicort, brovana nebs BID, duonebs q4h RVP negative D/C solumedrol  Loculated Pleural Effusion Positive for Metastatic Breast Caner Bedside right-sided thoracentesis 8/21 with 1200cc bloody pleural fluid obtained. Negative pleural fluid gram stain.  PCT < 0.1. Increasing leukocytosis. Completed 7 day course cefepime 8/23 Plan: Continue merem  and vanc, d/c vanc PCT is not effected with anything but leukemia and lymphoma so likely effective here.  Agitation requiring sedation protocol Plan: D/C propofol Fentanyl and precedex as ordered, d/c fentanyl  Malignant neoplasm of upper-outer quadrant of right breast in female, estrogen receptor positive ([place]) Continue arimidex opd med  AKI: Cr 1.56 from 1.88. ~baseline .7; BUN inc  Lasix IV as ordered Cont. Diamox, zaroxalyn AM BMP   Hypertension Monitor  No contraindication to deep vein thrombosis (DVT) prophylaxis Given cancer hx SQ lovenox   At risk for stress ulcer H2 blockade  At risk for hyperglycemia SSI resistant scale q4h lantus 15U qhs  History of sinus tachycardia Takes po dilt at home. resstarted here in hospital via tube  FAMILY  - Updates:--> Awaiting family to arrive for discussion prior to extubation.  DISPO Keep in ICU  The patient is critically ill with multiple organ systems failure and requires high complexity decision making for  assessment and support, frequent evaluation and titration of therapies, application of advanced monitoring technologies and extensive interpretation of multiple databases.   Critical Care Time devoted to patient care services described in this note is  39  Minutes. This time reflects time of care of this signee Dr [name]. This critical care time does not reflect procedure time, or teaching time or supervisory time of PA/NP/Med student/Med Resident etc but could involve care discussion time.  Rush Farmer, M.D. [facility] Pulmonary/Critical Care Medicine. Pager: [phone]. After hours pager: [phone].

## 2017-10-22 NOTE — Progress Notes (Signed)
HISTORY & PHYSICAL  Patient Name: Ashley Conrad MRN: [id number] DOB: March 16, 1945    ADMISSION DATE:  10/25/2017 DATE OF SERVICE:  10/16/2017  CHIEF COMPLAINT: Acute hypoxemic and hypercapnic respiratory; loculated right pleural effusion  BRIEF  [name] is 72 y.o. African-American female reformed smoker transferred to the [facility] Emergency Department via EMS from [facility] [place], [place]) with complaints of acute hypoxemic and hypercapnic respiratory failure and a loculated right pleural effusion.  I was contacted by Dr. Ardis Rowan at [facility] with a request to transfer the patient after she presented to [facility] emergency department with complaints of increasing dyspnea and increasing oxygen requirement.  She is a known COPD patient who uses to 5 LPM via nasal cannula at baseline.  Initially, the request was for higher level care in hopes of definitive treatment of a loculated right pleural effusion; however, during her ER stay at [facility], the patient clinically deteriorated and required endotracheal intubation and mechanical ventilatory support.  The patient was recently hospitalized (09/18/2017) at [facility], during which she underwent lumpectomy for ductal invasive carcinoma.  Apparently, the patient reported upon presentation to [facility] emergency department that she did not feel she had done well after her hospital discharge.  She initially presented with respiratory distress and oxygen saturation in the 70s on her 5 LPM via nasal cannula.  She also initially exhibited tachypnea and hypertension; however, as she deteriorated and required BiPAP support she became mildly hypotensive.  Trial of BiPAP therapy was initiated with IPAP 16, EPAP 10, FiO2 80%.  Follow-up ABGs demonstrated failure to improve her hypercapnic respiratory failure.  Conversation with Dr. Rozell Searing suggests that the patient was getting  intravenous Solu-Medrol along with aerosol therapies.  Further ER evaluation included chest x-ray, which raise concern for right-sided pleural effusion.  She had a CT angiogram of the chest which was negative for thromboembolic disease; however, the images were reported to be concerning for loculated pleural effusion.  The patient did transport with a CD, presumably containing CT images, which are being sent to radiology to be uploaded.  EVENTS  8/16: Presents to [facility] emergency department with complaints of increased dyspnea and increasing oxygen requirement.  Failed BiPAP support.  Intubated.  ER evaluation revealed a loculated right pleural effusion. 8/17: Transferred to [place]. [facility] ICU afte rintubation  09/27/2017 -  - on vent. STill hypercapnic. On fent gtt 156mg -> RASS -2 overnigt. Not on pressors. AFebrile. Making urine. Arrived in ICU few hours ago. No labs yet. On 100% fiow2/peep 5 -> pulse ox 95%  10/15/17 - IR bedside thoracentesis attempt with no fluid produced. Will return for IR guided thoracentesis 8/19.   8/20 - off diprivan. Increaed fio2 90, peep 12, sats ~94%. CXR still with bilateral effusion/consolidation/atelectasis, worse on the right. No fever. Makes urine. On Tube feeds.  8/21 - thoracentesis w/1200cc frank bloody fluid. 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     SUBJECTIVE/OVERNIGHT/INTERVAL HX Decreased sedation, patient responding to commands and questions. Pt comfortable, denies pain.   VITAL SIGNS: Blood pressure (!) 135/51, pulse 86, temperature 100 F (37.8 C), temperature source Oral, resp. rate (!) 30, height 5' (1.524 m), weight 82.3 kg, SpO2 94 %.  Vent Vent Mode: PRVC FiO2 (%):  [45 %-50 %] 50 % Set Rate:  [30 bmp] 30 bmp Vt Set:  [430 mL] 430 mL PEEP:  [10 cmH20] 10 cmH20 Plateau Pressure:  [21  cmH20-22 cmH20] 22 cmH20  INTAKE / OUTPUT:  Intake/Output Summary (Last 24 hours) at 10/22/2017  5366 Last data filed at 10/22/2017 0800 Gross per 24 hour  Intake 2775.07 ml  Output 3735 ml  Net -959.93 ml   PHYSICAL EXAMINATION:  Constitution: Awake and interactive HEENT: [place]/AT, PERRL, EOM-I and MMM Cardio: RRR, Nl S1/S2 and -M/R/G Respiratory: Decreased breath sounds, tachypnic, shallow breathing Abdominal: Soft, NT, ND and +BS MSK: Moving all ext to command Neuro: Alert, moving all ext to command Skin: c/d/i   LABS: Review of labs from [place] (4/40/3474 2119): Sodium 141, potassium 4.4, chloride 98, bicarbonate greater than 40, BUN 16, creatinine 0.6.  Glucose 131.  Calcium 8.7.  Protein 7.4.  Albumin 4.1.  Total bilirubin 0.4.  AST 29.  ALT 25.  Alkaline phosphatase 111.  CARDIAC ENZYMES (10/12/2017 2119): Troponin I less than 0.03.  Broke proBNP 120.  INR 1.1.  PTT 30.8.  D-dimer 510.  CBC (10/12/2017 2119): WBC 11.8 (73% neutrophils, 14% lymphocytes, 8% monocytes, 4% eosinophils, 1% basophils).  Hemoglobin 11.2, hematocrit 35.8.  MCV 97.5.  Platelets 264.  ABG (10/22/2017 0010): pH 7.18, PCO2 101, PO2 74 on BiPAP 16/10, FiO2 80% ABG (10/11/2017 0151): pH 7.10, PCO2 116, PO2 62 on AC 18/425/60/5  PULMONARY Recent Labs  Lab 10/17/17 1312  10/19/17 0232 10/20/17 0223 10/20/17 0851 10/21/17 0315 10/22/17 0322  PHART 7.426   < > 7.448 7.445 7.437 7.465* 7.403  PCO2ART 51.9*   < > 52.4* 56.0* 58.8* 50.5* 58.4*  PO2ART 80.0*   < > 65.0* 126.0* 77.0* 86.9 67.0*  HCO3 34.2*   < > 36.1* 38.5* 40.0* 35.9* 36.5*  TCO2 36*  --  38* 40* 42*  --  38*  O2SAT 96.0   < > 92.0 99.0 96.0 96.1 92.0   < > = values in this interval not displayed.   CBC Recent Labs  Lab 10/20/17 0222 10/21/17 0420 10/22/17 0343  HGB 9.8* 9.3* 9.5*  HCT 33.5* 31.8* 33.3*  WBC 27.6* 23.0* 24.2*  PLT 251 192 181   COAGULATION Recent Labs  Lab 10/17/17 0018  INR 1.18   CARDIAC   No results for input(s): TROPONINI in the last 168 hours. No results  for input(s): PROBNP in the last 168 hours.  CHEMISTRY Recent Labs  Lab 10/18/17 0328 10/19/17 0230 10/19/17 1505 10/20/17 0222 10/21/17 0420 10/22/17 0343  NA 145 147* 148* 147* 151* 146*  K 3.4* 3.5 3.7 4.4 4.2 3.5  CL 103 100 100 98 104 100  CO2 33* 35* 36* 35* 36* 37*  GLUCOSE 191* 252* 242* 271* 255* 228*  BUN 93* 96* 104* 114* 114* 103*  CREATININE 1.33* 1.58* 1.71* 1.88* 1.56* 1.52*  CALCIUM 8.7* 9.3 9.3 9.7 9.4 9.5  MG 3.0* 2.5*  --  2.4 2.4 2.2  PHOS 3.7 4.2  --  4.0 4.7* 5.9*   Estimated Creatinine Clearance: 29.5 mL/min (A) (by C-G formula based on SCr of 1.52 mg/dL (H)).  LIVER Recent Labs  Lab 10/17/17 0018  INR 1.18   INFECTIOUS No results for input(s): LATICACIDVEN, PROCALCITON in the last 168 hours. ENDOCRINE CBG (last 3)  Recent Labs    10/21/17 2347 10/22/17 0338 10/22/17 0713  GLUCAP 180* 189* 253*   IMAGING x48h  - image(s) personally visualized  -   highlighted in bold  Dg Chest Port 1 View  Result Date: 10/22/2017 CLINICAL DATA:  History of endotracheal tube. EXAM: PORTABLE CHEST 1 VIEW COMPARISON:  10/21/2017 FINDINGS: Endotracheal tube is 4.5 cm above the carina. Nasogastric tube extends into the abdomen. Again noted is a pigtail chest tube at the left apex region. No evidence for pneumothorax. There is a left arm PICC line and the tip extends into the right atrium. Persistent basilar chest densities, right side greater than left with underlying emphysematous changes. Heart and mediastinum are stable. IMPRESSION: Stable position of the left chest tube near the left lung apex. Negative for pneumothorax. Persistent basilar chest densities, right side greater than left, and minimally changed. PICC line tip in the right atrium. PICC line could be pulled back approximately 4 cm for positioning at the superior cavoatrial junction. Electronically Signed   By: Markus Daft M.D.   On: 10/22/2017 07:05   Dg Chest Port 1 View  Result Date:  10/21/2017 CLINICAL DATA:  Followup ventilator support EXAM: PORTABLE CHEST 1 VIEW COMPARISON:  10/20/2017.  10/19/2017. FINDINGS: Endotracheal tube tip is 5 cm above the carina. Nasogastric tube enters the abdomen. Left arm PICC tip is in the proximal right atrium. Pigtail catheter at the left pleural apex remains in place. No pleural air is seen. Small bilateral effusions, possibly slightly larger. Basilar atelectasis, slightly worsened. Upper lobe emphysema as seen previously. IMPRESSION: Lines and tubes well positioned.  No visible left pneumothorax. Slight increase in pleural fluid and basilar atelectasis. Electronically Signed   By: Nelson Chimes M.D.   On: 10/21/2017 07:56   . sodium chloride 10 mL/hr at 10/19/17 0000  . sodium chloride 10 mL/hr at 10/21/17 1000  . dexmedetomidine (PRECEDEX) IV infusion 0.6 mcg/kg/hr (10/22/17 0802)  . feeding supplement (VITAL HIGH PROTEIN) 1,000 mL (10/21/17 2015)  . fentaNYL infusion INTRAVENOUS 50 mcg/hr (10/21/17 1255)  . meropenem (MERREM) IV 500 mg (10/21/17 2201)  . norepinephrine (LEVOPHED) Adult infusion Stopped (10/21/17 0558)  . vancomycin 750 mg (10/21/17 1042)   Antibiotics: Cefepime 8/17 >> 8/23 Vanc/meropenem 8/24 >>  ASSESSMENT / PLAN: Principal Problem:   Acute on chronic respiratory failure with hypoxia and hypercapnia (HCC) Active Problems:   Malignant neoplasm of upper-outer quadrant of right breast in female, estrogen receptor positive (HCC)   Breast cancer (HCC)   COPD exacerbation (HCC)   Loculated pleural effusion   Hypertension   Agitation requiring sedation protocol   No contraindication to deep vein thrombosis (DVT) prophylaxis   At risk for stress ulcer   At risk for hyperglycemia   History of sinus tachycardia   ARDS (adult respiratory distress syndrome) (HCC)   Pleural effusion   Septic shock (HCC)   Acute renal failure (HCC)   Pneumothorax  Acute on chronic respiratory failure with hypoxia and hypercapnia  ([place]) Intubated 8/16. Diuretics held yesterday for AKI. CXR with pleural effusion increased. Pt non-sedated. Fent 50 mcg, precedex held. Levo held. PVRC RR 30 Vt 430 Fio2 70 PEEP 16 Cytology from pleural fluid is conisistent with breast cancer  Plan Diurese zaroxalyn + diamox Lasix 80m tid Replete K Wean pressors as able Replace electrolytes as indicated Titrate sedation as able Am CBC, BMP, Mg, Phos Titrate O2 and PEEP for sat of 88-92%  COPD exacerbation (HCC) No wheezing currently due to steroid and nebs. Solumedrol complete Plan: Pulmicort, brovana nebs BID, duonebs q4h RVP negative  Loculated Pleural Effusion Positive for Metastatic Breast Caner Bedside right-sided thoracentesis 8/21 with 1200cc bloody pleural fluid obtained. Negative pleural fluid gram stain.  PCT < 0.1. Increasing leukocytosis. Completed 7 day course cefepime 8/23 Plan: Cont. merepenam & vanc  PCT is  not effected with anything but leukemia and lymphoma so likely effective here.  Agitation requiring sedation protocol Plan: D/C propofol Fentanyl and precedex as ordered  Malignant neoplasm of upper-outer quadrant of right breast in female, estrogen receptor positive ([place]) Continue arimidex opd med  AKI: Cr 1.56 from 1.88. ~baseline .7; BUN inc  Holding lasix Cont. Diamox, zaroxalyn Am BMP   Hypertension Monitor  No contraindication to deep vein thrombosis (DVT) prophylaxis Given cancer hx Sq lovenox   At risk for stress ulcer h2 blockade  At risk for hyperglycemia SSI resistant scale q4h lantus 15U qhs  History of sinus tachycardia Takes po dilt at home. resstarted here in hospital via tube   FAMILY  - Updates:--> Met with the entire family yesterday, informed them of malignant cells in the pleural fluid and asked the question of trach/peg, they would like more time to think. Meeting again today for decision.   DISPO Keep in ICU  Marty Heck, DO 10/22/2017, 8:28 AM Pager:  [phone]

## 2017-10-22 NOTE — Procedures (Signed)
Extubation Procedure Note  Patient Details:   Name: TUCKER STEEDLEY DOB: 09/08/1945 MRN: [id number]   Airway Documentation:    Vent end date: 10/22/17 Vent end time: 1040   Evaluation  O2 sats: transiently fell during during procedure and currently acceptable Complications: No apparent complications Patient did tolerate procedure well. Bilateral Breath Sounds: Clear, Diminished   Yes   Pt extubated to 8L HFNC (home settings) per MD order. Pt had positive cuff leak prior to extubation. Pt has a very weak non productive cough post extubation and able to whisper her name. RT will continue to monitor.   Jesse Sans 10/22/2017, 10:52 AM

## 2017-10-22 NOTE — Progress Notes (Signed)
Pt in respiratory distress c/o SOB and chest pain. CCM resident aware. New orders for morphine, lasix, and hydralazine. Family at bedside. Emotional support given to patient and family. Chaplain services offered.

## 2017-10-22 NOTE — Progress Notes (Signed)
Patient became very hypoxemic, tachypnic and tachycardic.  Wheezing on exam, does not seem to be doing well from a respiratory standpoint.  Spoke with the family, informed them that she is not doing well and will likely expire.  They are waiting on additional family, will give her lasix, morphine and hydralazine.  Start BiPAP as a temporizing measure and evaluate clinically.  Confirmed DNR status.  The patient is critically ill with multiple organ systems failure and requires high complexity decision making for assessment and support, frequent evaluation and titration of therapies, application of advanced monitoring technologies and extensive interpretation of multiple databases.   Critical Care Time devoted to patient care services described in this note is  45  Minutes. This time reflects time of care of this signee Dr [name]. This critical care time does not reflect procedure time, or teaching time or supervisory time of PA/NP/Med student/Med Resident etc but could involve care discussion time.  Rush Farmer, M.D. [facility] Pulmonary/Critical Care Medicine. Pager: [phone]. After hours pager: [phone].

## 2017-10-23 ENCOUNTER — Inpatient Hospital Stay (HOSPITAL_COMMUNITY): Payer: Medicare Other

## 2017-10-23 DIAGNOSIS — J910 Malignant pleural effusion: Secondary | ICD-10-CM

## 2017-10-23 DIAGNOSIS — Z7189 Other specified counseling: Secondary | ICD-10-CM

## 2017-10-23 LAB — CBC WITH DIFFERENTIAL/PLATELET
ABS IMMATURE GRANULOCYTES: 0.3 10*3/uL — AB (ref 0.0–0.1)
BASOS PCT: 0 %
Basophils Absolute: 0.1 10*3/uL (ref 0.0–0.1)
EOS ABS: 1 10*3/uL — AB (ref 0.0–0.7)
Eosinophils Relative: 5 %
HCT: 37.3 % (ref 36.0–46.0)
Hemoglobin: 10.8 g/dL — ABNORMAL LOW (ref 12.0–15.0)
Immature Granulocytes: 1 %
Lymphocytes Relative: 4 %
Lymphs Abs: 0.9 10*3/uL (ref 0.7–4.0)
MCH: 31.9 pg (ref 26.0–34.0)
MCHC: 29 g/dL — ABNORMAL LOW (ref 30.0–36.0)
MCV: 110 fL — AB (ref 78.0–100.0)
MONO ABS: 1.3 10*3/uL — AB (ref 0.1–1.0)
MONOS PCT: 6 %
NEUTROS ABS: 18.9 10*3/uL — AB (ref 1.7–7.7)
Neutrophils Relative %: 84 %
PLATELETS: 188 10*3/uL (ref 150–400)
RBC: 3.39 MIL/uL — ABNORMAL LOW (ref 3.87–5.11)
RDW: 14.8 % (ref 11.5–15.5)
WBC: 22.4 10*3/uL — ABNORMAL HIGH (ref 4.0–10.5)

## 2017-10-23 LAB — BASIC METABOLIC PANEL
Anion gap: 17 — ABNORMAL HIGH (ref 5–15)
BUN: 99 mg/dL — AB (ref 8–23)
CO2: 38 mmol/L — ABNORMAL HIGH (ref 22–32)
CREATININE: 1.61 mg/dL — AB (ref 0.44–1.00)
Calcium: 10.6 mg/dL — ABNORMAL HIGH (ref 8.9–10.3)
Chloride: 102 mmol/L (ref 98–111)
GFR calc Af Amer: 35 mL/min — ABNORMAL LOW (ref >60)
GFR, EST NON AFRICAN AMERICAN: 30 mL/min — AB (ref >60)
GLUCOSE: 134 mg/dL — AB (ref 70–99)
Potassium: 2.7 mmol/L — CL (ref 3.5–5.1)
SODIUM: 157 mmol/L — AB (ref 135–145)

## 2017-10-23 LAB — GLUCOSE, CAPILLARY
Glucose-Capillary: 116 mg/dL — ABNORMAL HIGH (ref 70–99)
Glucose-Capillary: 120 mg/dL — ABNORMAL HIGH (ref 70–99)

## 2017-10-23 LAB — MAGNESIUM: MAGNESIUM: 2.4 mg/dL (ref 1.7–2.4)

## 2017-10-23 LAB — PHOSPHORUS: Phosphorus: 5.3 mg/dL — ABNORMAL HIGH (ref 2.5–4.6)

## 2017-10-23 MED ORDER — POTASSIUM CHLORIDE 10 MEQ/50ML IV SOLN
10.0000 meq | INTRAVENOUS | Status: AC
  Administered 2017-10-23 (×6): 10 meq via INTRAVENOUS
  Filled 2017-10-23 (×6): qty 50

## 2017-10-23 MED ORDER — LORAZEPAM 2 MG/ML IJ SOLN
2.0000 mg | Freq: Once | INTRAMUSCULAR | Status: AC
  Administered 2017-10-23: 2 mg via INTRAVENOUS

## 2017-10-23 MED ORDER — LORAZEPAM 2 MG/ML IJ SOLN
1.0000 mg | INTRAMUSCULAR | Status: DC | PRN
  Administered 2017-10-23: 1 mg via INTRAVENOUS
  Filled 2017-10-23: qty 1

## 2017-10-23 MED ORDER — GLYCOPYRROLATE 0.2 MG/ML IJ SOLN: 0.2000 mg | INTRAMUSCULAR | Status: DC | PRN

## 2017-10-23 MED ORDER — LORAZEPAM 2 MG/ML IJ SOLN
INTRAMUSCULAR | Status: AC
  Administered 2017-10-23: 2 mg via INTRAVENOUS
  Filled 2017-10-23: qty 1

## 2017-10-23 MED ORDER — LORAZEPAM 2 MG/ML IJ SOLN: 1.0000 mg | INTRAMUSCULAR | Status: DC | PRN

## 2017-10-23 MED ORDER — MORPHINE SULFATE (PF) 2 MG/ML IV SOLN: 2.0000 mg | INTRAVENOUS | Status: DC | PRN

## 2017-10-23 MED ORDER — FENTANYL CITRATE (PF) 100 MCG/2ML IJ SOLN
12.5000 ug | INTRAMUSCULAR | Status: DC | PRN
  Administered 2017-10-23: 12.5 ug via INTRAVENOUS

## 2017-10-23 MED ORDER — MORPHINE SULFATE (CONCENTRATE) 10 MG/0.5ML PO SOLN: 5.0000 mg | ORAL | Status: DC | PRN

## 2017-10-23 MED ORDER — MORPHINE SULFATE (PF) 2 MG/ML IV SOLN
1.0000 mg | INTRAVENOUS | Status: DC | PRN
  Administered 2017-10-23: 1 mg via INTRAVENOUS
  Filled 2017-10-23: qty 1

## 2017-10-23 MED ORDER — LORAZEPAM 2 MG/ML PO CONC: 1.0000 mg | ORAL | Status: DC | PRN

## 2017-10-23 MED ORDER — MORPHINE SULFATE (PF) 2 MG/ML IV SOLN
2.0000 mg | INTRAVENOUS | Status: DC | PRN
  Administered 2017-10-23: 2 mg via INTRAVENOUS
  Filled 2017-10-23: qty 1

## 2017-10-23 MED ORDER — MORPHINE SULFATE (PF) 4 MG/ML IV SOLN
INTRAVENOUS | Status: AC
  Administered 2017-10-23: 4 mg via INTRAVENOUS
  Filled 2017-10-23: qty 1

## 2017-10-23 MED ORDER — HALOPERIDOL LACTATE 5 MG/ML IJ SOLN: 1.0000 mg | Freq: Two times a day (BID) | INTRAMUSCULAR | Status: DC | PRN

## 2017-10-23 MED ORDER — LORAZEPAM 1 MG PO TABS: 1.0000 mg | ORAL_TABLET | ORAL | Status: DC | PRN

## 2017-10-23 MED ORDER — MORPHINE SULFATE (PF) 4 MG/ML IV SOLN
4.0000 mg | INTRAVENOUS | Status: AC
  Administered 2017-10-23: 4 mg via INTRAVENOUS

## 2017-10-23 MED ORDER — DEXTROSE 5 % IV SOLN
INTRAVENOUS | Status: DC
  Administered 2017-10-23: 10:00:00 via INTRAVENOUS

## 2017-10-23 MED ORDER — FENTANYL CITRATE (PF) 100 MCG/2ML IJ SOLN
INTRAMUSCULAR | Status: AC
  Filled 2017-10-23: qty 2

## 2017-10-24 LAB — CULTURE, BLOOD (ROUTINE X 2)
CULTURE: NO GROWTH
Culture: NO GROWTH
Special Requests: ADEQUATE
Special Requests: ADEQUATE

## 2017-10-26 ENCOUNTER — Telehealth: Payer: Self-pay | Admitting: Pulmonary Disease

## 2017-10-26 NOTE — Telephone Encounter (Signed)
Received D/C for signature from [place] and [facility]

## 2017-10-28 NOTE — Discharge Summary (Signed)
Physician Discharge Summary         Patient ID: Ashley Conrad MRN: [id number] DOB/AGE: October 05, 1945 72 y.o.  Admit date: 10/24/2017 Discharge date: 10/24/2017  Discharge Diagnoses:    Acute hypoxemic respiratory failure  Malignant Pleural Effusion  Metastatic Breast Cancer  AECOPD HTN  Acute renal failure  Pneumonia   Discharge summary    Ashley Conrad is 72 y.o. African-American female reformed smoker transferred to the [facility] Emergency Department via EMS from [facility] [place], [place]) with complaints of acute hypoxemic and hypercapnic respiratory failure and a loculated right pleural effusion.  I was contacted by Dr. Ardis Rowan at [facility] with a request to transfer the patient after she presented to [facility] emergency department with complaints of increasing dyspnea and increasing oxygen requirement.  She is a known COPD patient who uses to 5 LPM via nasal cannula at baseline.  Initially, the request was for higher level care in hopes of definitive treatment of a loculated right pleural effusion; however, during her ER stay at [facility], the patient clinically deteriorated and required endotracheal intubation and mechanical ventilatory support.  The patient was recently hospitalized (09/18/2017) at [facility], during which she underwent lumpectomy for ductal invasive carcinoma.  Apparently, the patient reported upon presentation to [facility] emergency department that she did not feel she had done well after her hospital discharge.  She initially presented with respiratory distress and oxygen saturation in the 70s on her 5 LPM via nasal cannula.  She also initially exhibited tachypnea and hypertension; however, as she deteriorated and required BiPAP support she became mildly hypotensive.  Trial of BiPAP therapy was initiated with IPAP 16, EPAP 10, FiO2 80%.  Follow-up ABGs demonstrated failure to  improve her hypercapnic respiratory failure.  Conversation with Dr. Rozell Searing suggests that the patient was getting intravenous Solu-Medrol along with aerosol therapies.  Further ER evaluation included chest x-ray, which raise concern for right-sided pleural effusion.  She had a CT angiogram of the chest which was negative for thromboembolic disease; however, the images were reported to be concerning for loculated pleural effusion.  The patient did transport with a CD, presumably containing CT images, which are being sent to radiology to be uploaded.  EVENTS  8/16: Presents to [facility] emergency department with complaints of increased dyspnea and increasing oxygen requirement.  Failed BiPAP support.  Intubated.  ER evaluation revealed a loculated right pleural effusion. 8/17: Transferred to [place] ICU afte rintubation 10/14/2017 -  - on vent. STill hypercapnic. On fent gtt 178mcg -> RASS -2 overnigt. Not on pressors. AFebrile. Making urine. Arrived in ICU few hours ago. No labs yet. On 100% fiow2/peep 5 -> pulse ox 95% 10/15/17 - IR bedside thoracentesis attempt with no fluid produced. Will return for IR guided thoracentesis 8/19.  8/20 - off diprivan. Increaed fio2 90, peep 12, sats ~94%. CXR still with bilateral effusion/consolidation/atelectasis, worse on the right. No fever. Makes urine. On Tube feeds. 8/21 - thoracentesis w/1200cc frank bloody fluid. 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   8/26 - Extubated  Decision was made to pursue comfort measures only with no re-intubation. The passed peacefully with family at the bedside. Palliative care was managing symptoms at the end of life.   Discharge Exam: Patient was pronounced by nursing staff. I did not examine the patient.   Labs at discharge   Lab Results  Component Value Date   CREATININE 1.61 (  H) October 25, 2017   BUN 99 (H) Oct 25, 2017   NA 157 (H) 2017/10/25   K 2.7 (LL) 10-25-2017    CL 102 10/25/17   CO2 38 (H) October 25, 2017   Lab Results  Component Value Date   WBC 22.4 (H) October 25, 2017   HGB 10.8 (L) 2017/10/25   HCT 37.3 10/25/2017   MCV 110.0 (H) 10-25-17   PLT 188 2017-10-25   Lab Results  Component Value Date   ALT 17 10/11/2017   AST 21 10/01/2017   ALKPHOS 85 10/27/2017   BILITOT 0.8 10/09/2017   Lab Results  Component Value Date   INR 1.18 10/17/2017    Current radiological studies    Dg Chest Port 1 View  Result Date: 10-25-17 CLINICAL DATA:  Post activation, slight shortness of breath EXAM: PORTABLE CHEST 1 VIEW COMPARISON:  Portable chest x-ray of 10/22/2016 FINDINGS: The endotracheal tube and NG tube have been removed. Aeration has improved somewhat. Bibasilar opacities remain consistent with atelectasis and effusions, left greater than right. Pneumonia at the left lung base cannot be excluded. Mild cardiomegaly is stable. Left central venous line is unchanged in position overlying the region of the right atrium. Chest tube on the left overlies the left lung apex and no definite pneumothorax is seen. IMPRESSION: 1. Improved aeration.  Endotracheal tube and NG tube removed. 2. Little change in bibasilar opacities left-greater-than-right. 3. No change in left chest tube.  No definite left pneumothorax. Electronically Signed   By: Ivar Drape M.D.   On: 10-25-2017 09:02    Disposition & Condition:   Deceased  Signed: Garner Nash 10/24/2017, 3:49 PM

## 2017-10-28 NOTE — Progress Notes (Addendum)
Family called RN to room to check on patient, patient noted to have absent pulse, absent breath sounds, pupils fixed and dilated. Family at bedside, comfort and emotional support provided to family at this time. Palliative MD remained at bedside for additional comfort orders prior to patient passing. Second RN, Lucile Crater, present and verified time of death. Will continue to provide to support to patients family.

## 2017-10-28 NOTE — Progress Notes (Signed)
Pt in severe resp distress and placed on bipap at this time. RN and MD made aware.

## 2017-10-28 NOTE — Progress Notes (Signed)
Pt taken off bipap and placed on 10L HFNC. Pt denies SOB, no increased WOB. VS within normal limits. RT will continue to monitor for possible bipap needs

## 2017-10-28 NOTE — Progress Notes (Signed)
Nutrition Brief Note  Chart reviewed. Patient's family has decided to pursue comfort measures. No further nutrition interventions warranted at this time.  Please consult as needed.   Molli Barrows, RD, LDN, [place] Pager [phone] After Hours Pager [phone]

## 2017-10-28 NOTE — Progress Notes (Signed)
PT Cancellation Note  Patient Details Name: Ashley Conrad MRN: [id number] DOB: [birth year]   Cancelled Treatment:    Reason Eval/Treat Not Completed: (P) Other (comment) Family wished pt to be placed on comfort care. PT orders discontinued. Thank you.  Hudsyn Barich B. Migdalia Dk PT, DPT Acute Rehabilitation  [phone] Pager [phone]     [place] 11-16-2017, 11:06 AM

## 2017-10-28 NOTE — Care Management Note (Addendum)
Case Management Note  Patient Details  Name: Ashley Conrad MRN: [id number] Date of Birth: 1945/11/02  Subjective/Objective:  Pt transferred from [facility].  Pt admitted with resp failure and loculated pleural effusion - pt discharged in July s/p mastectomy for breast CA                  Action/Plan:  Pt currently on vent and continuous sedation.  Pt is from home with spouse.  Pt is on 5 liters continuous oxygen at home.   Expected Discharge Date:                  Expected Discharge Plan:     In-House Referral:  Clinical Social Work  Discharge planning Services  CM Consult  Post Acute Care Choice:    Choice offered to:     DME Arranged:    DME Agency:     HH Arranged:    HH Agency:     Status of Service:     If discussed at [employer], dates discussed:    Additional Comments: Oct 29, 2017 Pt is now comfort care Maryclare Labrador, RN 10-29-17, 4:17 PM

## 2017-10-28 NOTE — Progress Notes (Addendum)
Patient Name: Ashley Conrad MRN: [id number] DOB: 03-01-1945    ADMISSION DATE:  09/29/2017 DATE OF SERVICE:  10/16/2017  CHIEF COMPLAINT: Acute hypoxemic and hypercapnic respiratory; loculated right pleural effusion  BRIEF  Ashley Conrad is 72 y.o. African-American female reformed smoker transferred to the [facility] Emergency Department via EMS from [facility] [place], [place]) with complaints of acute hypoxemic and hypercapnic respiratory failure and a loculated right pleural effusion.  I was contacted by Dr. Ardis Rowan at [facility] with a request to transfer the patient after she presented to [facility] emergency department with complaints of increasing dyspnea and increasing oxygen requirement.  She is a known COPD patient who uses to 5 LPM via nasal cannula at baseline.  Initially, the request was for higher level care in hopes of definitive treatment of a loculated right pleural effusion; however, during her ER stay at [facility], the patient clinically deteriorated and required endotracheal intubation and mechanical ventilatory support.  The patient was recently hospitalized (09/18/2017) at [facility], during which she underwent lumpectomy for ductal invasive carcinoma.  Apparently, the patient reported upon presentation to [facility] emergency department that she did not feel she had done well after her hospital discharge.  She initially presented with respiratory distress and oxygen saturation in the 70s on her 5 LPM via nasal cannula.  She also initially exhibited tachypnea and hypertension; however, as she deteriorated and required BiPAP support she became mildly hypotensive.  Trial of BiPAP therapy was initiated with IPAP 16, EPAP 10, FiO2 80%.  Follow-up ABGs demonstrated failure to improve her hypercapnic respiratory failure.  Conversation with Dr. Rozell Searing suggests that the patient was getting intravenous  Solu-Medrol along with aerosol therapies.  Further ER evaluation included chest x-ray, which raise concern for right-sided pleural effusion.  She had a CT angiogram of the chest which was negative for thromboembolic disease; however, the images were reported to be concerning for loculated pleural effusion.  The patient did transport with a CD, presumably containing CT images, which are being sent to radiology to be uploaded.  EVENTS  8/16: Presents to [facility] emergency department with complaints of increased dyspnea and increasing oxygen requirement.  Failed BiPAP support.  Intubated.  ER evaluation revealed a loculated right pleural effusion. 8/17: Transferred to [place] ICU afte rintubation  10/11/2017 -  - on vent. STill hypercapnic. On fent gtt 12mcg -> RASS -2 overnigt. Not on pressors. AFebrile. Making urine. Arrived in ICU few hours ago. No labs yet. On 100% fiow2/peep 5 -> pulse ox 95%  10/15/17 - IR bedside thoracentesis attempt with no fluid produced. Will return for IR guided thoracentesis 8/19.   8/20 - off diprivan. Increaed fio2 90, peep 12, sats ~94%. CXR still with bilateral effusion/consolidation/atelectasis, worse on the right. No fever. Makes urine. On Tube feeds.  8/21 - thoracentesis w/1200cc frank bloody fluid. 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    8/26 - Extubated  SUBJECTIVE/OVERNIGHT/INTERVAL HX Decreased sedation, patient responding to commands and questions. Pt comfortable, denies pain.   VITAL SIGNS: Blood pressure (!) 135/51, pulse 86, temperature 100 F (37.8 C), temperature source Oral, resp. rate (!) 30, height 5' (1.524 m), weight 82.3 kg, SpO2 94 %.  Vent Vent Mode: PRVC FiO2 (%):  [40 %-80 %] 80 % Set Rate:  [30 bmp] 30 bmp Vt Set:  [430 mL] 430 mL PEEP:  [5 cmH20-10 cmH20] 5 cmH20 Plateau Pressure:  [  [place] / OUTPUT:  Intake/Output Summary (Last 24 hours) at 2017-11-01  0865 Last data filed at 01-Nov-2017 0620 Gross per 24 hour  Intake 1462.68 ml  Output 2109 ml  Net -646.32 ml   PHYSICAL EXAMINATION:  Constitution: alert awake and interactive, NAD, supine in bed HEENT: venturi mask w/breathing treatment, [place], AT Cardio: tachycardic, regular rhythm, no m/r/g Respiratory: decreased breath sounds, wheezing on left, left CT to water seal Abdominal: hypoactive BS, soft, NTTP MSK: no pitting edema, +pedal pulses, strength 4/5 UE b/l Neuro: alert, following commands  Skin: c/d/i  LABS: Review of labs from [place] (7/84/6962 2119): Sodium 141, potassium 4.4, chloride 98, bicarbonate greater than 40, BUN 16, creatinine 0.6.  Glucose 131.  Calcium 8.7.  Protein 7.4.  Albumin 4.1.  Total bilirubin 0.4.  AST 29.  ALT 25.  Alkaline phosphatase 111.  CARDIAC ENZYMES (10/12/2017 2119): Troponin I less than 0.03.  Broke proBNP 120.  INR 1.1.  PTT 30.8.  D-dimer 510.  CBC (10/12/2017 2119): WBC 11.8 (73% neutrophils, 14% lymphocytes, 8% monocytes, 4% eosinophils, 1% basophils).  Hemoglobin 11.2, hematocrit 35.8.  MCV 97.5.  Platelets 264.  ABG (10/16/2017 0010): pH 7.18, PCO2 101, PO2 74 on BiPAP 16/10, FiO2 80% ABG (10/07/2017 0151): pH 7.10, PCO2 116, PO2 62 on AC 18/425/60/5  PULMONARY Recent Labs  Lab 10/17/17 1312  10/19/17 0232 10/20/17 0223 10/20/17 0851 10/21/17 0315 10/22/17 0322  PHART 7.426   < > 7.448 7.445 7.437 7.465* 7.403  PCO2ART 51.9*   < > 52.4* 56.0* 58.8* 50.5* 58.4*  PO2ART 80.0*   < > 65.0* 126.0* 77.0* 86.9 67.0*  HCO3 34.2*   < > 36.1* 38.5* 40.0* 35.9* 36.5*  TCO2 36*  --  38* 40* 42*  --  38*  O2SAT 96.0   < > 92.0 99.0 96.0 96.1 92.0   < > = values in this interval not displayed.   CBC Recent Labs  Lab 10/21/17 0420 10/22/17 0343 Nov 01, 2017 0523  HGB 9.3* 9.5* 10.8*  HCT 31.8* 33.3* 37.3  WBC 23.0* 24.2* 22.4*  PLT 192 181 188   COAGULATION Recent Labs  Lab 10/17/17 0018  INR  1.18   CARDIAC   No results for input(s): TROPONINI in the last 168 hours. No results for input(s): PROBNP in the last 168 hours.  CHEMISTRY Recent Labs  Lab 10/19/17 0230 10/19/17 1505 10/20/17 0222 10/21/17 0420 10/22/17 0343 11/01/17 0523  NA 147* 148* 147* 151* 146* 157*  K 3.5 3.7 4.4 4.2 3.5 2.7*  CL 100 100 98 104 100 102  CO2 35* 36* 35* 36* 37* 38*  GLUCOSE 252* 242* 271* 255* 228* 134*  BUN 96* 104* 114* 114* 103* 99*  CREATININE 1.58* 1.71* 1.88* 1.56* 1.52* 1.61*  CALCIUM 9.3 9.3 9.7 9.4 9.5 10.6*  MG 2.5*  --  2.4 2.4 2.2 2.4  PHOS 4.2  --  4.0 4.7* 5.9* 5.3*   Estimated Creatinine Clearance: 27.4 mL/min (A) (by C-G formula based on SCr of 1.61 mg/dL (H)).  LIVER Recent Labs  Lab 10/17/17 0018  INR 1.18   INFECTIOUS No results for input(s): LATICACIDVEN, PROCALCITON in the last 168 hours. ENDOCRINE CBG (last 3)  Recent Labs    10/22/17 1912 10/22/17 2340 2017/11/01 0328  GLUCAP 170* 115* 116*   IMAGING x48h  - image(s) personally visualized  -   highlighted in bold  Dg Chest Port 1 View  Result Date: 10/22/2017 CLINICAL DATA:  History of endotracheal tube. EXAM: PORTABLE CHEST 1 VIEW COMPARISON:  10/21/2017 FINDINGS: Endotracheal tube is 4.5 cm above the carina. Nasogastric tube extends into the abdomen. Again noted is a pigtail chest tube at the left apex region. No evidence for pneumothorax. There is a left arm PICC line and the tip extends into the right atrium. Persistent basilar chest densities, right side greater than left with underlying emphysematous changes. Heart and mediastinum are stable. IMPRESSION: Stable position of the left chest tube near the left lung apex. Negative for pneumothorax. Persistent basilar chest densities, right side greater than left, and minimally changed. PICC line tip in the right atrium. PICC line could be pulled back approximately 4 cm for positioning at the superior cavoatrial junction. Electronically Signed   By: Markus Daft M.D.   On: 10/22/2017 07:05   . sodium chloride Stopped (10/22/17 1500)  . sodium chloride 10 mL/hr at 11-22-2017 0600  . dexmedetomidine (PRECEDEX) IV infusion Stopped (10/22/17 1028)  . meropenem (MERREM) IV Stopped (10/22/17 2215)  . norepinephrine (LEVOPHED) Adult infusion Stopped (10/21/17 0558)  . potassium chloride 10 mEq (11-22-2017 0617)   Antibiotics: Cefepime 8/17 >> 8/23 meropenem 8/24 >> vanc 8/24 >> 8/26  ASSESSMENT / PLAN: Principal Problem:   Acute on chronic respiratory failure with hypoxia and hypercapnia (HCC) Active Problems:   Malignant neoplasm of upper-outer quadrant of right breast in female, estrogen receptor positive (HCC)   Breast cancer (HCC)   COPD exacerbation (HCC)   Loculated pleural effusion   Hypertension   Agitation requiring sedation protocol   No contraindication to deep vein thrombosis (DVT) prophylaxis   At risk for stress ulcer   At risk for hyperglycemia   History of sinus tachycardia   ARDS (adult respiratory distress syndrome) (HCC)   Pleural effusion   Septic shock (HCC)   Acute renal failure (HCC)   Pneumothorax  Acute on chronic respiratory failure with hypoxia and hypercapnia ([place]) Intubated 8/16 - extubated yesterday with increasing WOB. Placed on bipap with family decision not to proceed with tracheostomy. CXR with pleural effusion vs. small PTX on left.  Cytology from pleural fluid is conisistent with breast cancer  Plan Attempting to wean from bipap today  SLP eval?  Hypokalemic: Replete K Hold lasix for AKI  Replace electrolytes as indicated Am CBC, BMP, Mg, Phos Titrate O2 and PEEP for sat of 88-92%  COPD exacerbation (HCC) No wheezing currently due to steroid and nebs. Solumedrol complete Plan: Pulmicort, brovana nebs BID, duonebs q4h RVP negative  VAP: Completed 7 day course cefepime 8/23. Leukocytosis improving - cont. merepename  - vanc d/c'd  Loculated Pleural Effusion Positive for Metastatic Breast  Caner Bedside right-sided thoracentesis 8/21 with 1200cc bloody pleural fluid obtained. Negative pleural fluid gram stain.  PCT < 0.1.  Plan:  PCT is not effected with anything but leukemia and lymphoma so likely effective here.  Agitation requiring sedation protocol Plan: resolved  Malignant neoplasm of upper-outer quadrant of right breast in female, estrogen receptor positive ([place]) Continue arimidex opd med  AKI: Cr 1.6 from 1.5. ~baseline .7; BUN inc  Holding lasix Am BMP   Hypertension Monitor  No contraindication to deep vein thrombosis (DVT) prophylaxis Given cancer hx Sq lovenox   At risk for stress ulcer h2 blockade  At risk for hyperglycemia SSI resistant scale q4h lantus 15U qhs  History of sinus tachycardia Takes po dilt at home. Switched to IV metoprolol as NPO.    FAMILY  - Updates:--> Pt extubated yesterday.  Family has decided not to proceed with tracheostomy. After extubation yesterday pt had increasing WOB. She was placed on bipap to give family time to spend with her.   DISPO Keep in ICU  Marty Heck, DO 2017/11/16, 6:51 AM Pager: [phone]     Subjective: This is a 72 y.o., female admitted on 10/10/2017 for acute hypoxemic respiratory failure requiring intubation and mechanical ventilation.  The patient was found to have bilateral pleural effusion as well as a left-sided pneumothorax.  Pigtail was placed with resolution of the thorax.  Pleural fluid cytology was positive for metastatic breast cancer.  After discussion with the family the patient was made a DNR and elected for extubation once medically optimized.  Patient was extubated on 10/22/2017 and has done well since.  Objective: BP 110/78   Pulse 93   Temp 97.9 F (36.6 C) (Axillary)   Resp 15   Ht 5' (1.524 m)   Wt 73.2 kg   SpO2 92%   BMI 31.52 kg/m    Intake/Output Summary (Last 24 hours) at 16-Nov-2017 0805 Last data filed at 11/16/2017 0700 Gross per 24 hour  Intake 964.67  ml  Output 1909 ml  Net -944.33 ml   FiO2 (%):  [80 %] 80 % PEEP:  [5 cmH20] 5 cmH20  Physical Examination: General appearance: 72 y.o., female, NAD, conversant  Eyes: anicteric sclerae, moist conjunctivae; no lid-lag; PERRL, tracking appropriately HENT: NCAT; oropharynx dry, Mallampati 4 Neck: Trachea midline; FROM, supple, lymphadenopathy, no JVD Chest: Incision along the right upper quadrant of the right breast.  Clean dry intact Lungs: Diminished bilaterally, with normal respiratory effort and no intercostal retractions CV: RRR, S1, S2, no MRGs  Abdomen: Soft, non-tender; non-distended, BS present  Extremities: No peripheral edema or radial and DP pulses present bilaterally, Left UE PICC Skin: Normal temperature, turgor and texture; no rash Psych: Appropriate affect Neuro: Alert and oriented to person, following commands   Recent Labs  Lab 10/21/17 0420 10/22/17 0343 November 16, 2017 0523  NA 151* 146* 157*  K 4.2 3.5 2.7*  CL 104 100 102  CO2 36* 37* 38*  BUN 114* 103* 99*  CREATININE 1.56* 1.52* 1.61*  GLUCOSE 255* 228* 134*   Recent Labs  Lab 10/21/17 0420 10/22/17 0343 November 16, 2017 0523  WBC 23.0* 24.2* 22.4*  HGB 9.3* 9.5* 10.8*  HCT 31.8* 33.3* 37.3  PLT 192 181 188   Recent Labs  Lab 10/17/17 0018  INR 1.18   Recent Labs  Lab 10/20/17 0851 10/21/17 0315 10/22/17 0322  PHART 7.437 7.465* 7.403  PCO2ART 58.8* 50.5* 58.4*  PO2ART 77.0* 86.9 67.0*   Chest Imaging:  Chest x-ray Nov 16, 2017, small bilateral pleural effusion, left PICC line, left apical pigtail catheter placement.  No pneumothorax.  Assessment:  Acute on chronic hypoxemic hypercarbic respiratory failure Metastatic breast cancer, ER positive, malignant right pleural effusion bilateral pleural effusion Left-sided pneumothorax status post pigtail catheter placement Acute exacerbation of COPD, now resolved Possible ventilator associated pneumonia versus tracheobronchitis, leukocytosis, remains  afebrile, respiratory cultures with few gram-negative rods and few positive cocci from 10/14/2017. Diabetes mellitus type 2, hyperglycemia Electrolyte imbalance: Hypokalemia, hypernatremia, hyperphosphatemia Acute on chronic heart failure with preserved ejection fraction, EF 55 to 45%, grade 1 diastolic dysfunction, mildly elevated pulmonary artery systolic pressure 35 mmHg. AKI  DNR  Plan:  Hold additional diuresis at this time.  She appears euvolemic or slightly dry on exam.  She is also hypernatremic. Unable to take p.o. at this time will start D5W at 75  cc an hour to correct free water deficit slowly. Will have some SPL evaluate patient for possible advancement of diet. CBG every 4 hour Will need to de-escalate antimicrobial coverage.  Will discuss with pharmacy. Likely stop meropenem and observe fever curve.  Stopped all orders for vasopressors as well as sedation.  As these are no longer needed. If she is unable to take p.o. we will have to discuss options regarding medication administration and oral intake with family.  Unclear at this time if they would want to pursue Dobbhoff tube placement. She was still using positive pressure ventilation overnight. The right pigtail should stay in until we have determined her need for NIPPV. She would be high risk for recurrent PTx but appears to have tolerated this well. If the goal is for comfort only would discontinue NiPPV.  We appreciate palliative care's input and help with transition from the hospital.  Can consider transition from the intensive care unit to progressive care.  [facility] November 05, 2017 8:05 AM  Personal pager: [phone] If unanswered, please page CCM On-call: [phone]

## 2017-10-28 NOTE — Progress Notes (Addendum)
PCCM:  I spoke with husband at the bedside. He is interested in pursuing comfort measures. He would like his daughters to be here. They will be here in about an hour. We will follow up then.  Ashley Nash, DO [facility] 2017/10/25 9:22 AM  Personal pager: [phone] If unanswered, please page CCM On-call: [phone]  Met with family at bedside. They would like to pursue comfort measures. They would like to leave the NiPPV until the son can get here. He is on his way. We will make appropriate medication changes. Comfort care orderset placed.   Ashley Nash, DO [facility] October 25, 2017 11:01 AM  Personal pager: [phone] If unanswered, please page CCM On-call: [phone]

## 2017-10-28 NOTE — Progress Notes (Signed)
SLP Cancellation Note  Patient Details Name: Ashley Conrad MRN: [id number] DOB: Jul 24, 1945   Cancelled treatment:       Reason Eval/Treat Not Completed: Medical issues which prohibited therapy. Pt in respiratory distress this morning requiring BiPAP. Pt/family appear to be pursuing comfort care. Will hold eval for now.   Germain Osgood 2017-10-29, 11:37 AM  Germain Osgood, M.A. CCC-SLP [phone]

## 2017-10-28 NOTE — Progress Notes (Signed)
PT Cancellation Note  Patient Details Name: Ashley Conrad MRN: [id number] DOB: 1945-04-19   Cancelled Treatment:    Reason Eval/Treat Not Completed: (P) Medical issues which prohibited therapy Pt is tachypenic and did not tolerate BiPAP well this morning. Family is currently visiting. PT will follow back to check on status of comfort care.   Derak Schurman B. Migdalia Dk PT, DPT Acute Rehabilitation  [phone] Pager [phone]     [place] 10-24-17, 8:53 AM

## 2017-10-28 NOTE — Progress Notes (Signed)
Palliative care MD at bedside, new orders received. Plan to wait for additional family and then remove Bipap. Family understanding, all questions answered. Patient having periods of anxiety and shortness of breath. Antianxiety medication given with relief of symptoms and morphine to be given before bipap is removed for patients comfort. Will continue to monitor and care for patient.

## 2017-10-28 NOTE — Consult Note (Signed)
Palliative care consult note  Reason for consult: Goals of care, end-of-life care  Palliative medicine consult received.  Chart reviewed including relevant imaging and nursing notes.  I met and discussed with PCCM, resident physician, and bedside RN.  Ashley Conrad is a 72 year old female with recent diagnosis of invasive ductal carcinoma admitted with acute hypoxemic and hypercapnic respiratory failure with loculated right pleural effusion.  She was clinically deteriorating and required endotracheal intubation and plan was made for one-way extubation.  She has been requiring BiPAP since that time.  I met today with various family members at the bedside including patient's daughter, son, and husband.  We discussed plan for transition to full comfort care with discontinuation of BiPAP.   We discussed clinical course as well as wishes moving forward in regard care plan this hospitalization.  We discussed difference between a aggressive medical intervention path and a palliative, comfort focused care path.  Values and goals of care important to patient and family were attempted to be elicited.  Discussed symptom management including concerns for pain, anxiety, agitation, shortness of breath, or nausea.  I formulated plan for medications for comfort in conjunction with resident physician on critical care team.  After arrival of family, RN paged me to bedside and BiPAP removed following premedication with 2 mg of IV morphine.  Following removal of BiPAP, patient decompensated quickly with significant increase in work of breathing.  Orders were placed for stat dose of morphine as well as Ativan as patient with signs of significant distress following removal of BiPAP.  Following medication, patient was much more restful and without signs of distress or labored breathing.  A short time after, family notified staff of cessation of breathing which was confirmed by bedside staff.  Total time: 85  minutes Greater than 50%  of this time was spent counseling and coordinating care related to the above assessment and plan.  Micheline Rough, MD [facility] [phone]

## 2017-10-28 NOTE — Progress Notes (Signed)
[place] Progress Note Patient Name: Ashley Conrad DOB: 04-Sep-1945 MRN: [id number]   Date of Service  11-10-2017  HPI/Events of Note  K+ = 2.7 and Creatinine = 1.6. Patient being diuresed with Lasix.   eICU Interventions  Will replace K+.      Intervention Category Major Interventions: Electrolyte abnormality - evaluation and management  Sommer,Steven Eugene 2017-11-10, 6:07 AM

## 2017-10-28 NOTE — Progress Notes (Signed)
Pt taken off bipap per Comfort Measure protocol to RA. RN and MD at bedside

## 2017-10-28 DEATH — deceased

## 2017-10-30 ENCOUNTER — Telehealth: Payer: Self-pay

## 2017-10-30 NOTE — Telephone Encounter (Signed)
On 10/30/17 I received the d/c back from Doctor Nelda Marseille.  I got the d/c ready and called the funeral home to let them know the d/c is ready for pickup.

## 2018-01-17 ENCOUNTER — Other Ambulatory Visit: Payer: Medicare Other

## 2018-01-17 ENCOUNTER — Ambulatory Visit: Payer: Medicare Other | Admitting: Oncology

## 2018-04-01 ENCOUNTER — Encounter: Payer: Commercial Indemnity | Admitting: Adult Health

## 2018-12-03 IMAGING — DX DG CHEST 1V PORT
1 series · 1 of 1 positions shown · non-contrast
Comparison: 10/17/2016

CLINICAL DATA: Post right thoracentesis last night.

EXAM:
PORTABLE CHEST 1 VIEW

[chest ap]
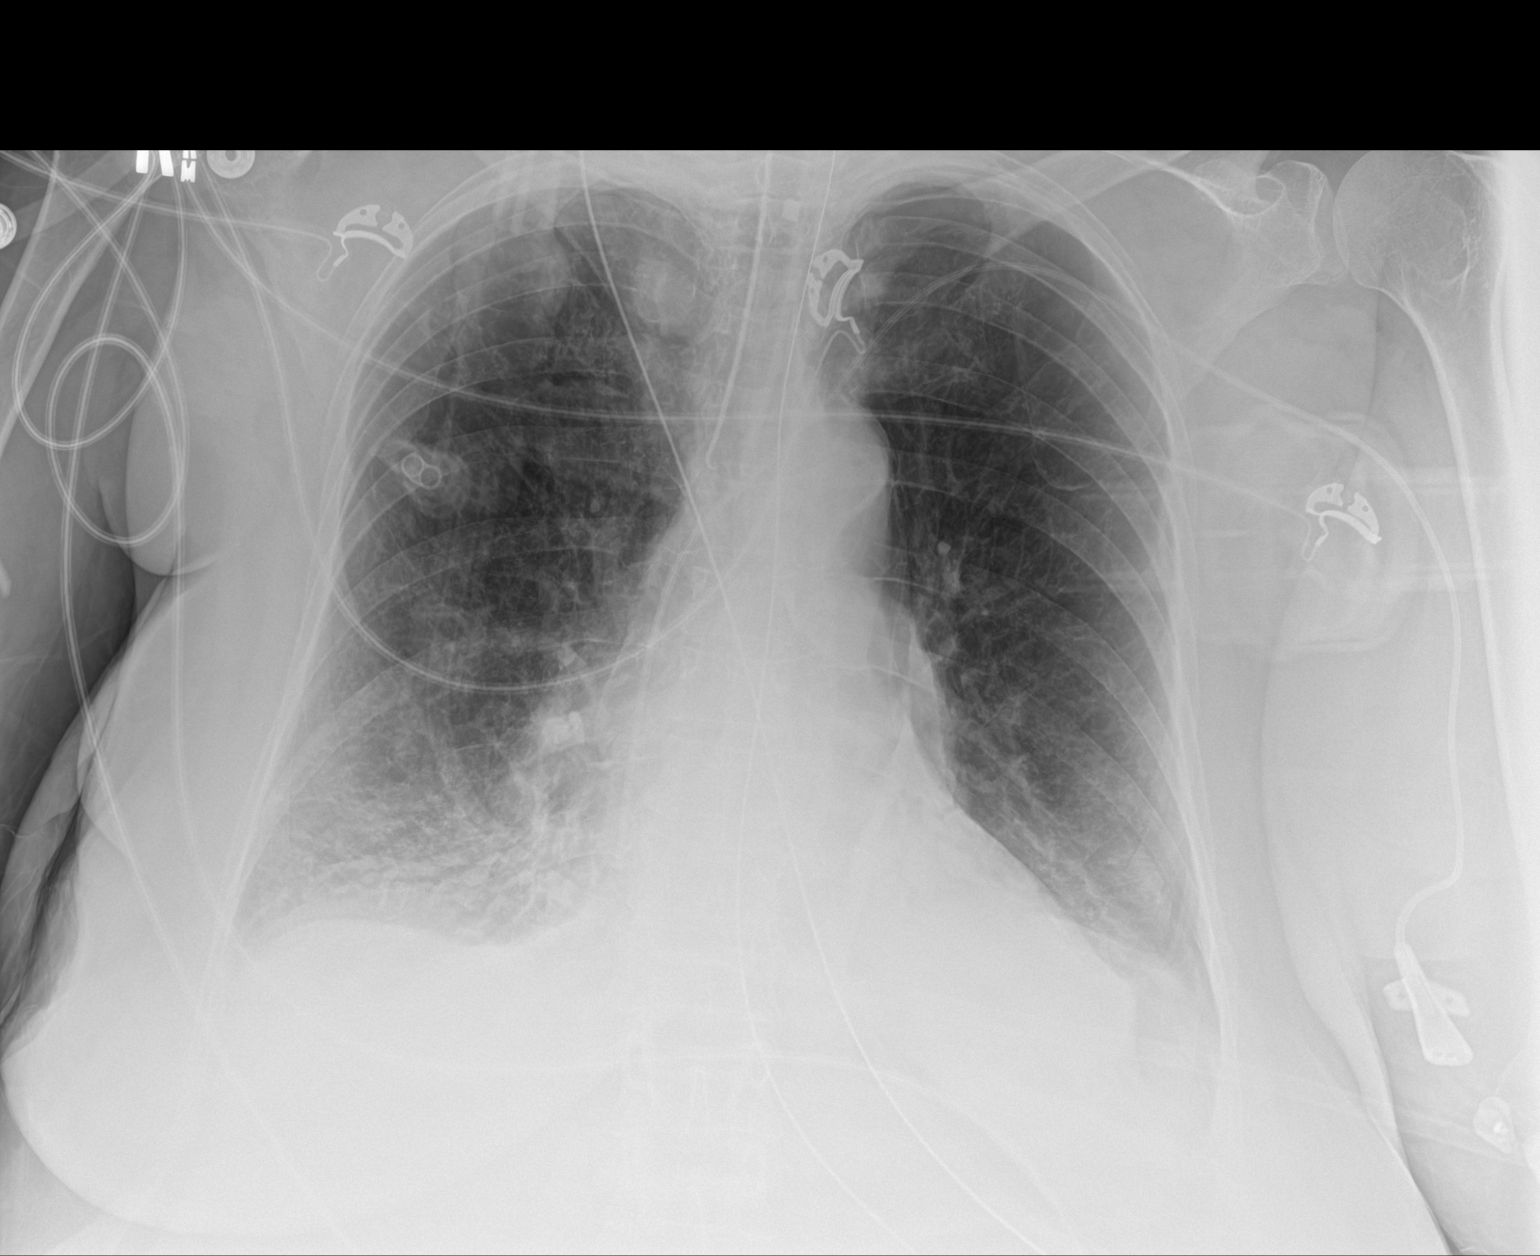

[1 of 1 positions shown; findings below may reference images not displayed]

FINDINGS: Endotracheal tube has tip 3.8 cm above the carina. Nasogastric tube
courses into the stomach and off the inferior portion of the film as
tip is not visualized. Left-sided PICC line has tip just below the
cavoatrial junction.

Lungs are adequately inflated demonstrate stable left base
opacification likely small effusion with atelectasis. Slight
worsening right base opacification likely atelectasis and small
amount of pleural fluid. No evidence of pneumothorax.
Cardiomediastinal silhouette and remainder of the exam is unchanged.
IMPRESSION: Slight worsening opacification right base likely atelectasis with
small amount of pleural fluid. Stable left base opacification likely
small amount of pleural fluid and atelectasis. No pneumothorax.

Tubes and lines as described.

## 2018-12-03 IMAGING — DX DG CHEST 1V PORT
1 series · 1 of 1 positions shown · non-contrast
Comparison: 10/16/2016, 10/15/2017, 10/14/2016

CLINICAL DATA: Right pleural effusion post thoracentesis

EXAM:
PORTABLE CHEST 1 VIEW

[chest]
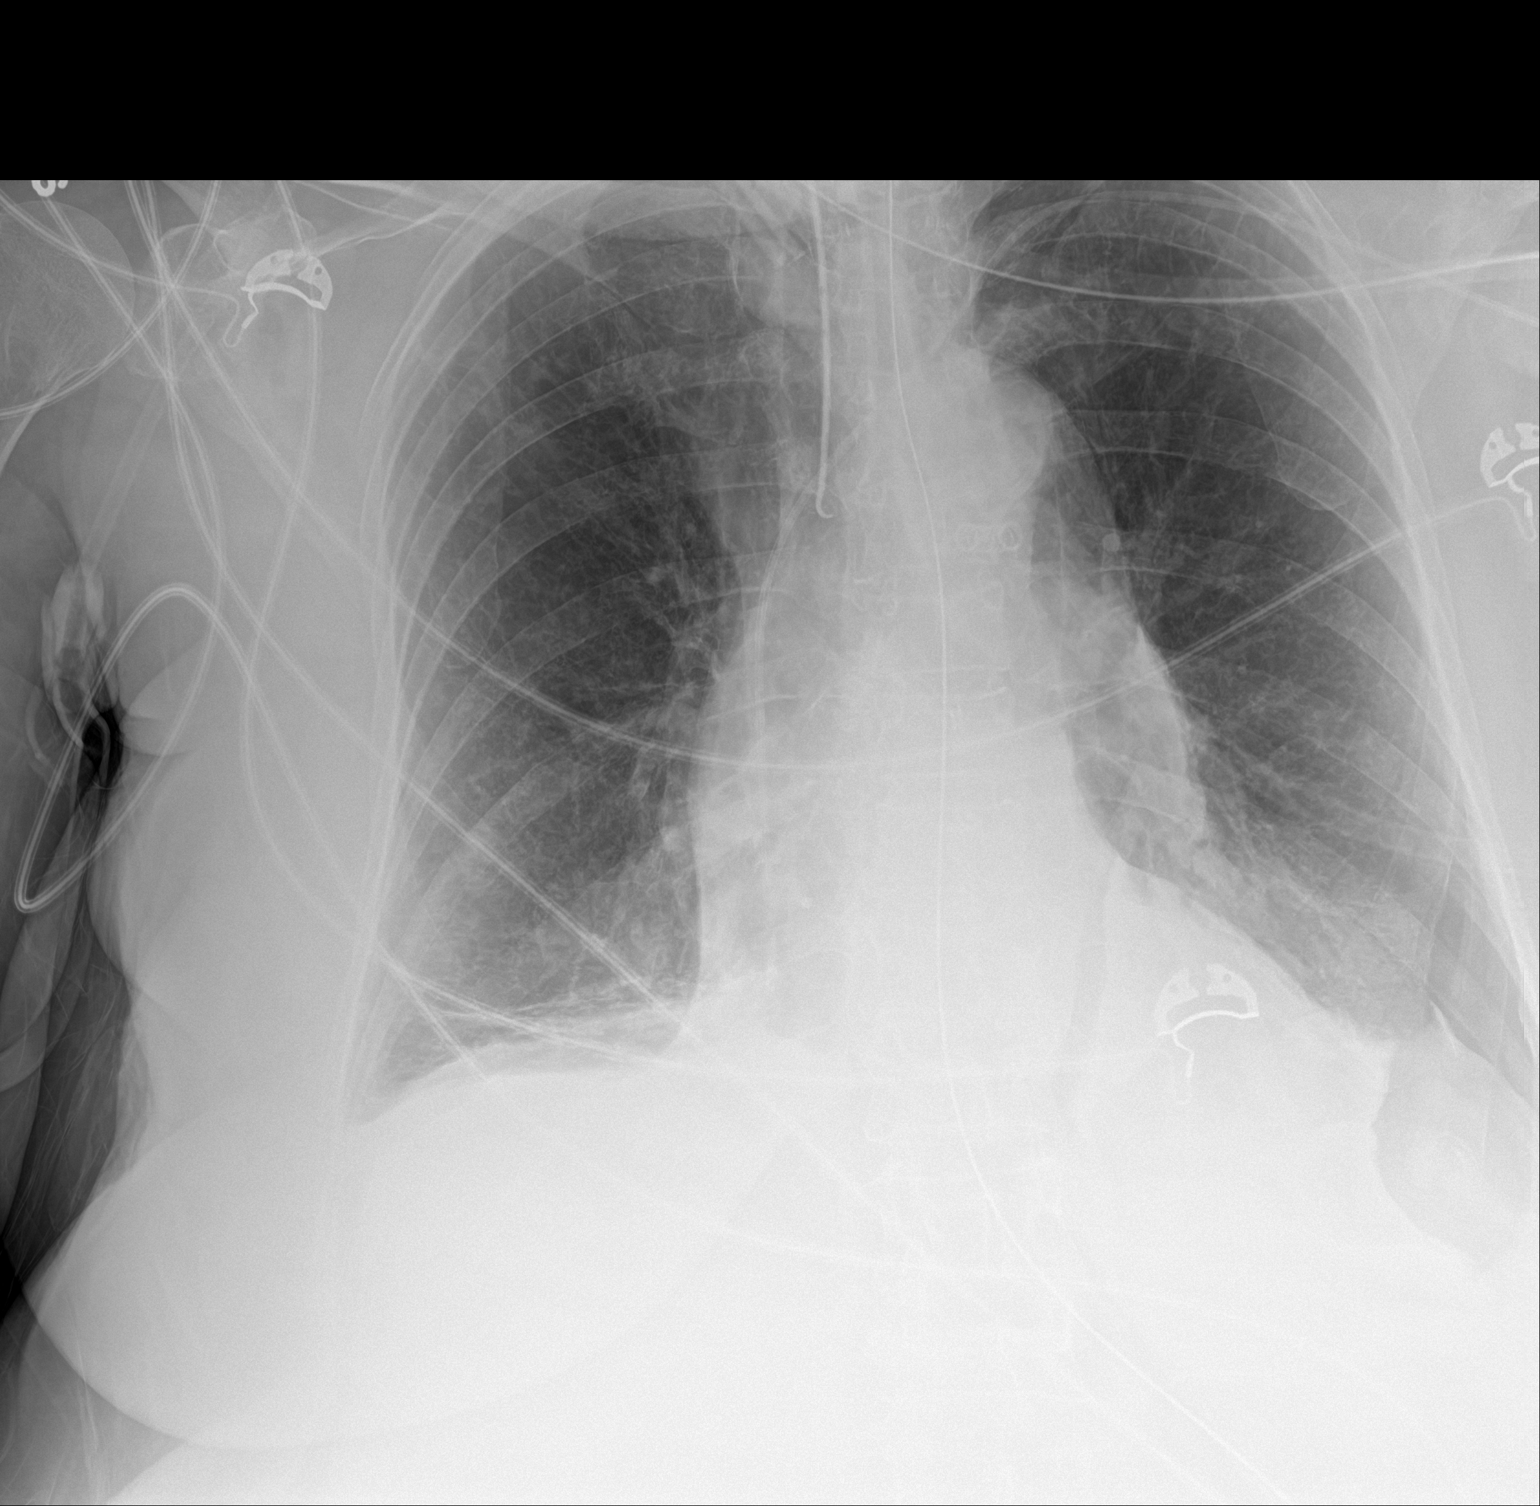

[1 of 1 positions shown; findings below may reference images not displayed]

FINDINGS: Endotracheal tube tip is about 2.2 cm superior to the carina.
Esophageal tube tip is below the diaphragm. Small right pleural
effusion, decreased. No pneumothorax on the right. Small left
pleural effusion. Consolidation at the left lung base. Stable
cardiomediastinal silhouette with aortic atherosclerosis. Left upper
extremity catheter tip at the cavoatrial region. Hyperlucency at the
left CP angle.
IMPRESSION: 1. Support lines and tubes as above.
2. Small right pleural effusion, decreased compared to prior. No
right pneumothorax.
3. Small left pleural effusion. Hyperlucency at the left CP angle is
questionable for pneumothorax. Attention on follow-up radiograph
recommended.
4. Subsegmental atelectasis right base. Consolidation at the left
lung base, atelectasis or pneumonia

## 2018-12-04 IMAGING — DX DG CHEST 1V PORT
1 series · 1 of 1 positions shown · non-contrast
Comparison: 10/17/2017

CLINICAL DATA: Check endotracheal tube placement

EXAM:
PORTABLE CHEST 1 VIEW

[chest ap]
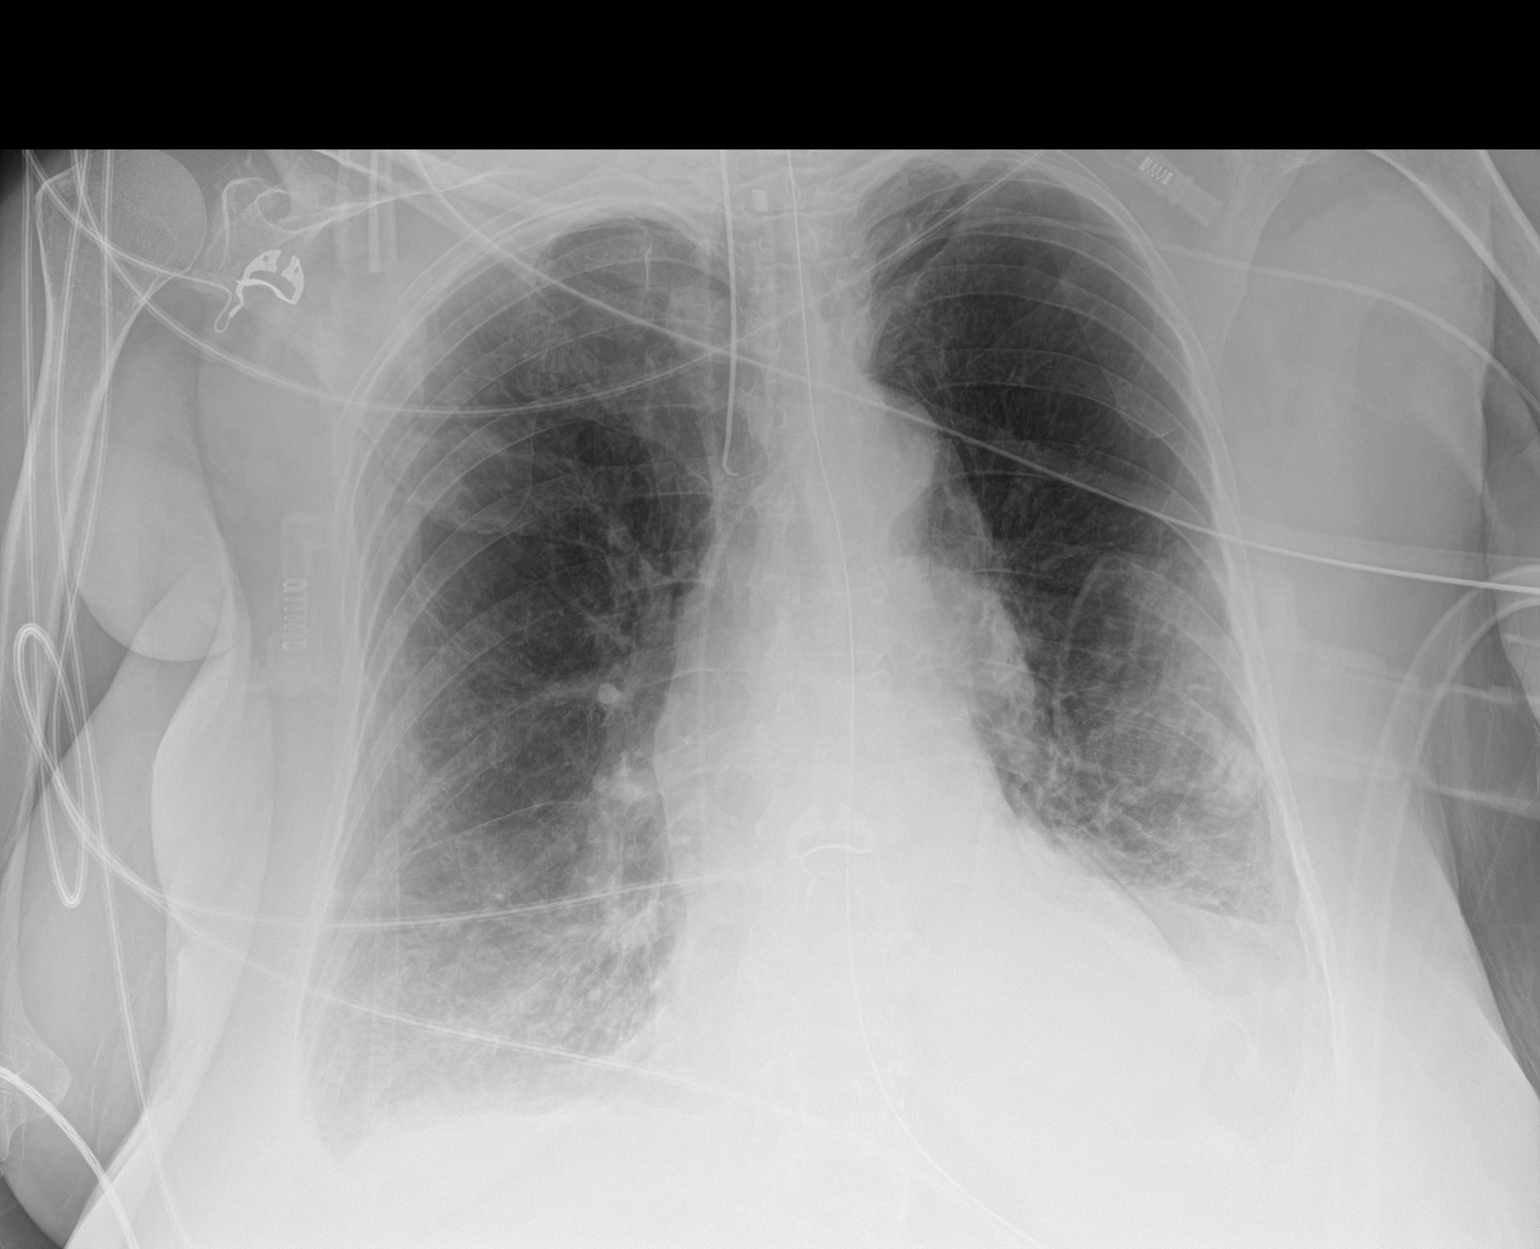

[1 of 1 positions shown; findings below may reference images not displayed]

FINDINGS: Cardiac shadow is within normal limits. The endotracheal tube,
left-sided PICC line and nasogastric catheter are in satisfactory
position. Small left pleural effusion is noted new from the prior
exam. Increasing left basilar atelectasis is noted. Persistent right
basilar atelectasis is seen.
IMPRESSION: Increase in left basilar changes with effusion.

## 2018-12-05 IMAGING — DX DG CHEST 1V PORT
1 series · 1 of 1 positions shown · non-contrast
Comparison: CT 10/19/2017, 10/12/2017. Chest x-ray 10/19/2017.
10/13/2017.

CLINICAL DATA: Left chest tube.

EXAM:
PORTABLE CHEST 1 VIEW

[chest]
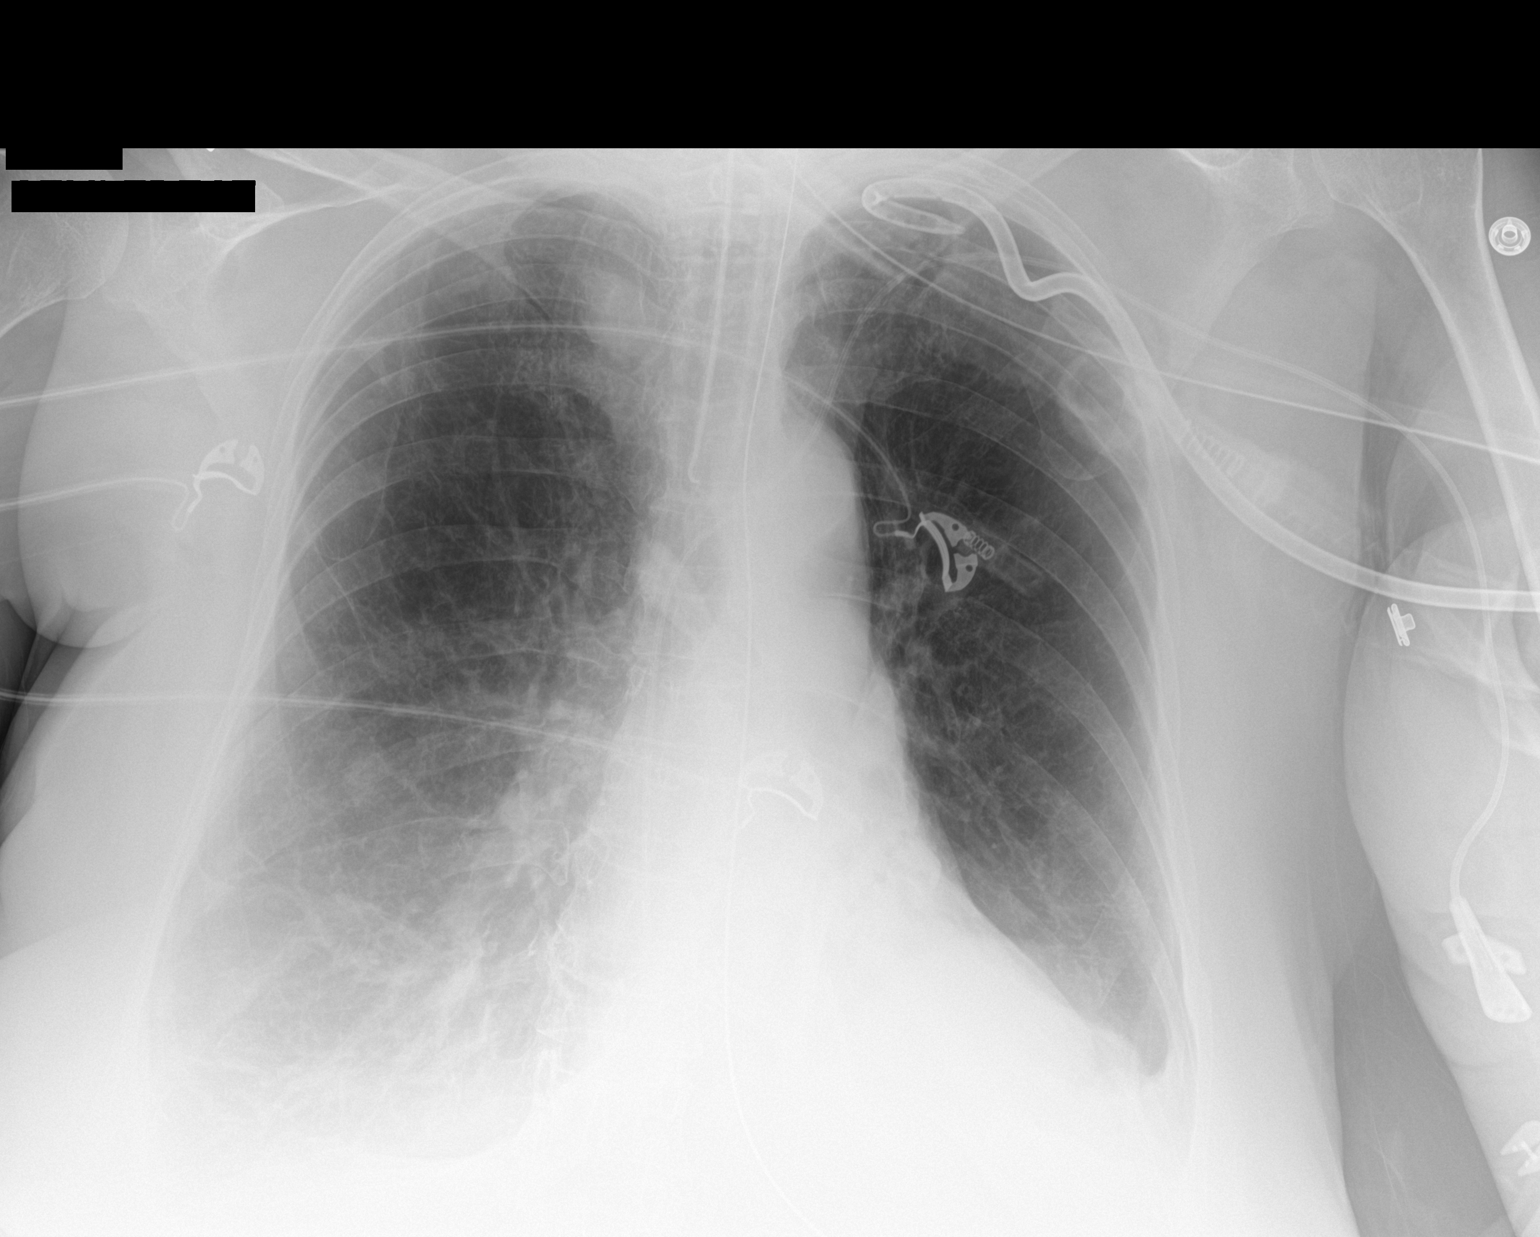

[1 of 1 positions shown; findings below may reference images not displayed]

FINDINGS: Left chest tube noted over the left upper chest. No pneumothorax
left PICC line, NG tube, endotracheal tube in stable position.
Stable bilateral pleural fluid collections. Persistent pleural-based
density over the right apex again noted. Again this could represent
a loculated fluid collection and or tumor. Again as noted on prior
CT report of 10/19/2017 continued evaluation on follow-up exams to
demonstrate resolution suggested. Heart size stable. No acute bony
abnormality.
IMPRESSION: 1. Interim placement of left chest tube and resolution of left-sided
pneumothorax.

2.  Left PICC line, NG tube, endotracheal tube in stable position.

3.  Stable bilateral pleural effusions.

4. Persistent right apical pleural-based density again noted. This
could represent loculated fluid and or a tumor. Again as noted on
prior CT report of 10/19/2017 continued evaluation on follow-up
exams to demonstrate resolution suggested.

## 2018-12-05 IMAGING — DX DG CHEST 1V PORT
1 series · 1 of 1 positions shown · non-contrast
Comparison: 10/18/2017.

CLINICAL DATA: COPD exacerbation.

EXAM:
PORTABLE CHEST 1 VIEW

[chest ap]
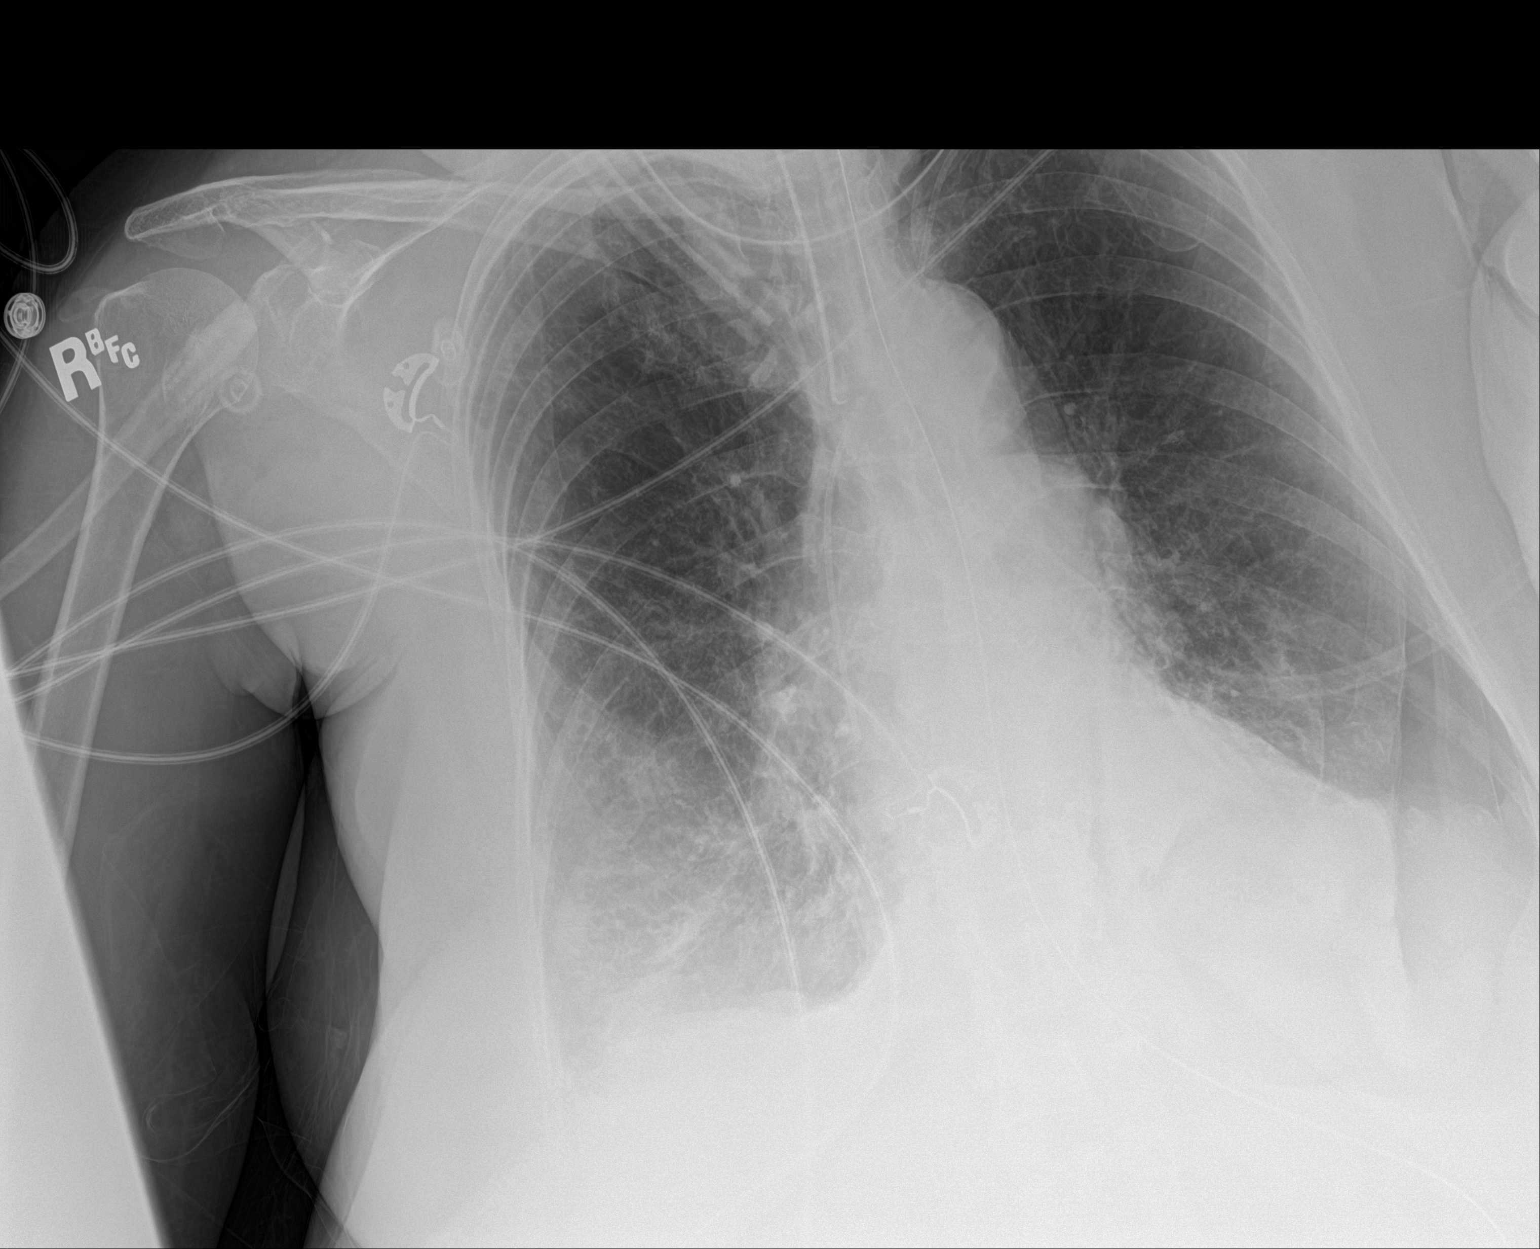

[1 of 1 positions shown; findings below may reference images not displayed]

FINDINGS: Endotracheal tube, NG tube, left PICC line stable position. Heart
size stable. Stable bibasilar atelectasis/infiltrates. Small
bilateral pleural effusions. Moderate sized left base pneumothorax
appears to be present.
IMPRESSION: 1.  New moderate size left base pneumothorax appears to be present.

2. Lines and tubes stable position. Stable bibasilar
atelectasis/infiltrates. Small bilateral pleural effusions.

Critical Value/emergent results were called by telephone at the time
of interpretation on 10/19/2017 at [DATE] to nurse [place] , who
verbally acknowledged these results.
